# Patient Record
Sex: Female | Born: 1970 | Hispanic: Yes | Smoking: Never smoker
Health system: Southern US, Community
[De-identification: ages and names within clinical notes are randomized; demographics above are authoritative.]

## PROBLEM LIST (undated history)

## (undated) DIAGNOSIS — E7800 Pure hypercholesterolemia, unspecified: Secondary | ICD-10-CM

## (undated) DIAGNOSIS — I10 Essential (primary) hypertension: Secondary | ICD-10-CM

## (undated) DIAGNOSIS — C801 Malignant (primary) neoplasm, unspecified: Secondary | ICD-10-CM

## (undated) DIAGNOSIS — F32A Depression, unspecified: Secondary | ICD-10-CM

## (undated) DIAGNOSIS — F419 Anxiety disorder, unspecified: Secondary | ICD-10-CM

## (undated) HISTORY — PX: MASTECTOMY: SHX3

## (undated) HISTORY — PX: ABDOMINAL HYSTERECTOMY: SHX81

---

## 2020-06-10 DIAGNOSIS — R42 Dizziness and giddiness: Secondary | ICD-10-CM | POA: Insufficient documentation

## 2020-12-05 ENCOUNTER — Encounter (HOSPITAL_COMMUNITY): Payer: Self-pay | Admitting: Emergency Medicine

## 2020-12-05 ENCOUNTER — Emergency Department (HOSPITAL_COMMUNITY)
Admission: EM | Admit: 2020-12-05 | Discharge: 2020-12-05 | Disposition: A | Discharge status: Home / Self Care | Payer: Medicare Other | Attending: Emergency Medicine | Admitting: Emergency Medicine

## 2020-12-05 ENCOUNTER — Other Ambulatory Visit: Payer: Self-pay

## 2020-12-05 DIAGNOSIS — R0602 Shortness of breath: Secondary | ICD-10-CM | POA: Insufficient documentation

## 2020-12-05 DIAGNOSIS — Z5321 Procedure and treatment not carried out due to patient leaving prior to being seen by health care provider: Secondary | ICD-10-CM | POA: Diagnosis not present

## 2020-12-05 DIAGNOSIS — R112 Nausea with vomiting, unspecified: Secondary | ICD-10-CM | POA: Insufficient documentation

## 2020-12-05 DIAGNOSIS — R519 Headache, unspecified: Secondary | ICD-10-CM | POA: Insufficient documentation

## 2020-12-05 HISTORY — DX: Anxiety disorder, unspecified: F41.9

## 2020-12-05 HISTORY — DX: Pure hypercholesterolemia, unspecified: E78.00

## 2020-12-05 HISTORY — DX: Malignant (primary) neoplasm, unspecified: C80.1

## 2020-12-05 HISTORY — DX: Depression, unspecified: F32.A

## 2020-12-05 HISTORY — DX: Essential (primary) hypertension: I10

## 2020-12-05 LAB — CBC
HCT: 46.1 % — ABNORMAL HIGH (ref 36.0–46.0)
Hemoglobin: 15.4 g/dL — ABNORMAL HIGH (ref 12.0–15.0)
MCH: 32.4 pg (ref 26.0–34.0)
MCHC: 33.4 g/dL (ref 30.0–36.0)
MCV: 96.8 fL (ref 80.0–100.0)
Platelets: 152 10*3/uL (ref 150–400)
RBC: 4.76 MIL/uL (ref 3.87–5.11)
RDW: 13.1 % (ref 11.5–15.5)
WBC: 4.8 10*3/uL (ref 4.0–10.5)
nRBC: 0 % (ref 0.0–0.2)

## 2020-12-05 LAB — COMPREHENSIVE METABOLIC PANEL
ALT: 80 U/L — ABNORMAL HIGH (ref 0–44)
AST: 58 U/L — ABNORMAL HIGH (ref 15–41)
Albumin: 4.8 g/dL (ref 3.5–5.0)
Alkaline Phosphatase: 129 U/L — ABNORMAL HIGH (ref 38–126)
Anion gap: 8 (ref 5–15)
BUN: 14 mg/dL (ref 6–20)
CO2: 27 mmol/L (ref 22–32)
Calcium: 9.8 mg/dL (ref 8.9–10.3)
Chloride: 106 mmol/L (ref 98–111)
Creatinine, Ser: 0.74 mg/dL (ref 0.44–1.00)
GFR, Estimated: >60 mL/min (ref >60)
Glucose, Bld: 144 mg/dL — ABNORMAL HIGH (ref 70–99)
Potassium: 4.2 mmol/L (ref 3.5–5.1)
Sodium: 141 mmol/L (ref 135–145)
Total Bilirubin: 0.6 mg/dL (ref 0.3–1.2)
Total Protein: 7.9 g/dL (ref 6.5–8.1)

## 2020-12-05 LAB — LIPASE, BLOOD: Lipase: 37 U/L (ref 11–51)

## 2020-12-05 NOTE — ED Triage Notes (Addendum)
Patient woke this morning with a headache, nausea, emesis. Was driving down the road today and her boyfriend had to pull over because she had an episode of emesis. Denies diarrhea or abdominal pain. Got both COVID-19 vaccines. Has a bad headache, took her migraine medicine and came to ED as referred by her PCP.

## 2021-01-31 DIAGNOSIS — K760 Fatty (change of) liver, not elsewhere classified: Secondary | ICD-10-CM | POA: Insufficient documentation

## 2021-06-21 ENCOUNTER — Emergency Department (HOSPITAL_COMMUNITY): Payer: Medicare Other

## 2021-06-21 ENCOUNTER — Encounter (HOSPITAL_COMMUNITY): Payer: Self-pay | Admitting: Internal Medicine

## 2021-06-21 ENCOUNTER — Inpatient Hospital Stay (HOSPITAL_COMMUNITY)
Admission: EM | Admit: 2021-06-21 | Discharge: 2021-06-27 | DRG: 314 | Disposition: A | Discharge status: Home Health | Payer: Medicare Other | Attending: Family Medicine | Admitting: Family Medicine

## 2021-06-21 ENCOUNTER — Other Ambulatory Visit: Payer: Self-pay

## 2021-06-21 DIAGNOSIS — L03114 Cellulitis of left upper limb: Principal | ICD-10-CM | POA: Diagnosis present

## 2021-06-21 DIAGNOSIS — R7881 Bacteremia: Secondary | ICD-10-CM | POA: Diagnosis present

## 2021-06-21 DIAGNOSIS — Z853 Personal history of malignant neoplasm of breast: Secondary | ICD-10-CM

## 2021-06-21 DIAGNOSIS — R509 Fever, unspecified: Secondary | ICD-10-CM

## 2021-06-21 DIAGNOSIS — L299 Pruritus, unspecified: Secondary | ICD-10-CM | POA: Diagnosis not present

## 2021-06-21 DIAGNOSIS — F32A Depression, unspecified: Secondary | ICD-10-CM | POA: Diagnosis present

## 2021-06-21 DIAGNOSIS — R519 Headache, unspecified: Secondary | ICD-10-CM | POA: Diagnosis present

## 2021-06-21 DIAGNOSIS — Z452 Encounter for adjustment and management of vascular access device: Secondary | ICD-10-CM

## 2021-06-21 DIAGNOSIS — Y848 Other medical procedures as the cause of abnormal reaction of the patient, or of later complication, without mention of misadventure at the time of the procedure: Secondary | ICD-10-CM | POA: Diagnosis present

## 2021-06-21 DIAGNOSIS — Z20822 Contact with and (suspected) exposure to covid-19: Secondary | ICD-10-CM | POA: Diagnosis present

## 2021-06-21 DIAGNOSIS — Z79811 Long term (current) use of aromatase inhibitors: Secondary | ICD-10-CM

## 2021-06-21 DIAGNOSIS — Z79899 Other long term (current) drug therapy: Secondary | ICD-10-CM

## 2021-06-21 DIAGNOSIS — Z9889 Other specified postprocedural states: Secondary | ICD-10-CM

## 2021-06-21 DIAGNOSIS — E7800 Pure hypercholesterolemia, unspecified: Secondary | ICD-10-CM | POA: Diagnosis present

## 2021-06-21 DIAGNOSIS — A408 Other streptococcal sepsis: Secondary | ICD-10-CM | POA: Diagnosis present

## 2021-06-21 DIAGNOSIS — I10 Essential (primary) hypertension: Secondary | ICD-10-CM | POA: Diagnosis present

## 2021-06-21 DIAGNOSIS — D849 Immunodeficiency, unspecified: Secondary | ICD-10-CM | POA: Diagnosis present

## 2021-06-21 DIAGNOSIS — Z95828 Presence of other vascular implants and grafts: Secondary | ICD-10-CM

## 2021-06-21 DIAGNOSIS — D696 Thrombocytopenia, unspecified: Secondary | ICD-10-CM | POA: Diagnosis present

## 2021-06-21 DIAGNOSIS — Z888 Allergy status to other drugs, medicaments and biological substances status: Secondary | ICD-10-CM | POA: Diagnosis not present

## 2021-06-21 DIAGNOSIS — L03313 Cellulitis of chest wall: Secondary | ICD-10-CM | POA: Diagnosis present

## 2021-06-21 DIAGNOSIS — T80211A Bloodstream infection due to central venous catheter, initial encounter: Principal | ICD-10-CM | POA: Diagnosis present

## 2021-06-21 DIAGNOSIS — L0390 Cellulitis, unspecified: Secondary | ICD-10-CM | POA: Diagnosis present

## 2021-06-21 DIAGNOSIS — Z9013 Acquired absence of bilateral breasts and nipples: Secondary | ICD-10-CM | POA: Diagnosis not present

## 2021-06-21 DIAGNOSIS — T80219D Unspecified infection due to central venous catheter, subsequent encounter: Secondary | ICD-10-CM | POA: Diagnosis not present

## 2021-06-21 DIAGNOSIS — G43909 Migraine, unspecified, not intractable, without status migrainosus: Secondary | ICD-10-CM | POA: Diagnosis present

## 2021-06-21 DIAGNOSIS — T80219A Unspecified infection due to central venous catheter, initial encounter: Secondary | ICD-10-CM | POA: Diagnosis present

## 2021-06-21 LAB — CBC
HCT: 42.8 % (ref 36.0–46.0)
Hemoglobin: 14.3 g/dL (ref 12.0–15.0)
MCH: 31.6 pg (ref 26.0–34.0)
MCHC: 33.4 g/dL (ref 30.0–36.0)
MCV: 94.7 fL (ref 80.0–100.0)
Platelets: 112 10*3/uL — ABNORMAL LOW (ref 150–400)
RBC: 4.52 MIL/uL (ref 3.87–5.11)
RDW: 13.8 % (ref 11.5–15.5)
WBC: 5.8 10*3/uL (ref 4.0–10.5)
nRBC: 0 % (ref 0.0–0.2)

## 2021-06-21 LAB — BASIC METABOLIC PANEL
Anion gap: 7 (ref 5–15)
BUN: 13 mg/dL (ref 6–20)
CO2: 22 mmol/L (ref 22–32)
Calcium: 8.7 mg/dL — ABNORMAL LOW (ref 8.9–10.3)
Chloride: 106 mmol/L (ref 98–111)
Creatinine, Ser: 0.87 mg/dL (ref 0.44–1.00)
GFR, Estimated: >60 mL/min (ref >60)
Glucose, Bld: 139 mg/dL — ABNORMAL HIGH (ref 70–99)
Potassium: 3.6 mmol/L (ref 3.5–5.1)
Sodium: 135 mmol/L (ref 135–145)

## 2021-06-21 LAB — RESP PANEL BY RT-PCR (FLU A&B, COVID) ARPGX2
Influenza A by PCR: NEGATIVE
Influenza B by PCR: NEGATIVE
SARS Coronavirus 2 by RT PCR: NEGATIVE

## 2021-06-21 LAB — LACTIC ACID, PLASMA: Lactic Acid, Venous: 0.9 mmol/L (ref 0.5–1.9)

## 2021-06-21 LAB — HIV ANTIBODY (ROUTINE TESTING W REFLEX): HIV Screen 4th Generation wRfx: NONREACTIVE

## 2021-06-21 IMAGING — CT CT HEAD W/O CM
3 series · 16 of 47 positions shown, 19 images · non-contrast
Comparison: None.

CLINICAL DATA: Headache.

EXAM:
CT HEAD WITHOUT CONTRAST
TECHNIQUE: Contiguous axial images were obtained from the base of the skull
through the vertex without intravenous contrast.

[Series 2: head w o · axial · 0.44mm/px · z∈[+55,+185]mm · 10 of 32 slices shown, 13 images]
[im 3/32  brain]
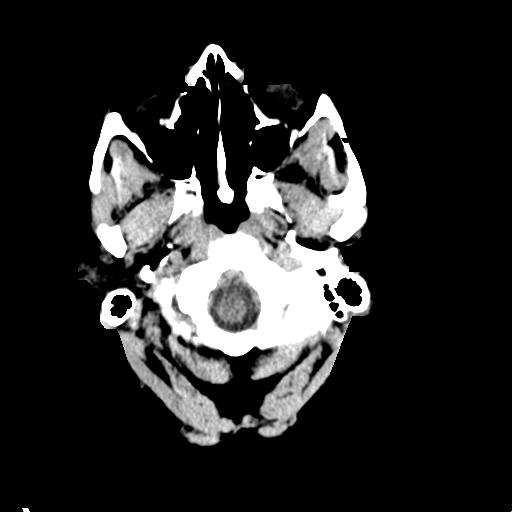
[im 3/32  bone]
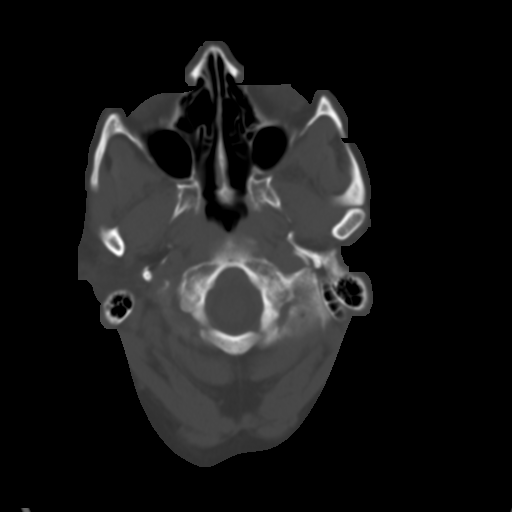
[im 6/32  brain]
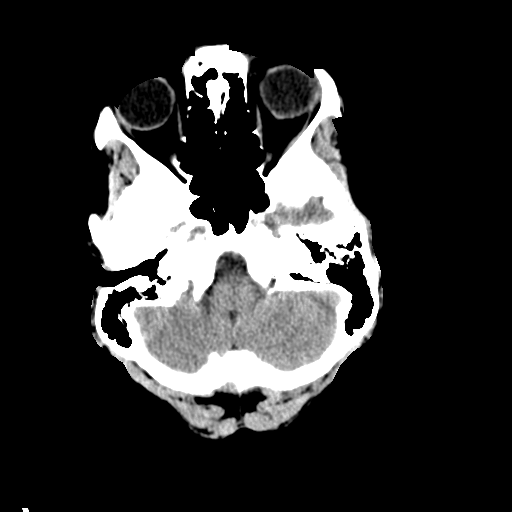
[im 9/32  brain]
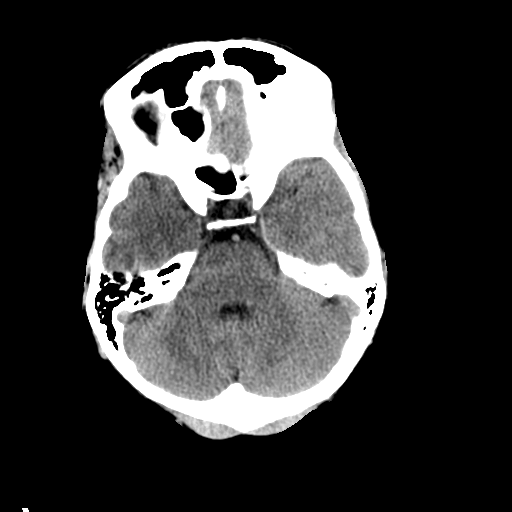
[im 11/32  brain]
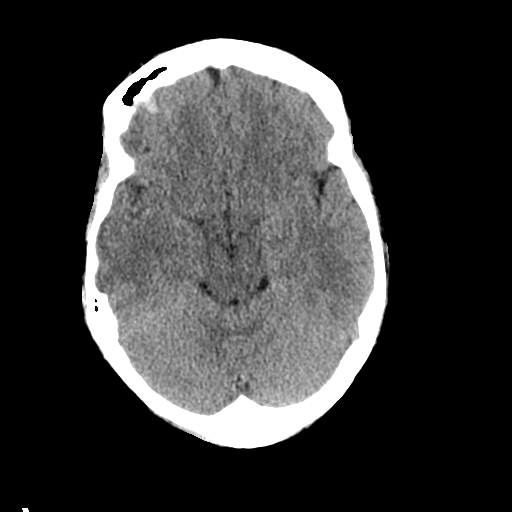
[im 14/32  brain]
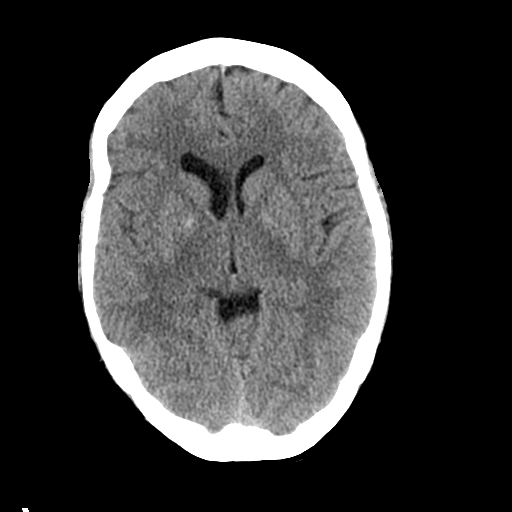
[im 14/32  bone]
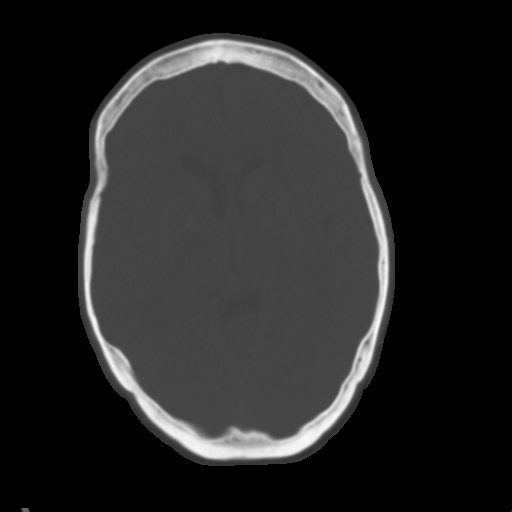
[im 18/32  brain]
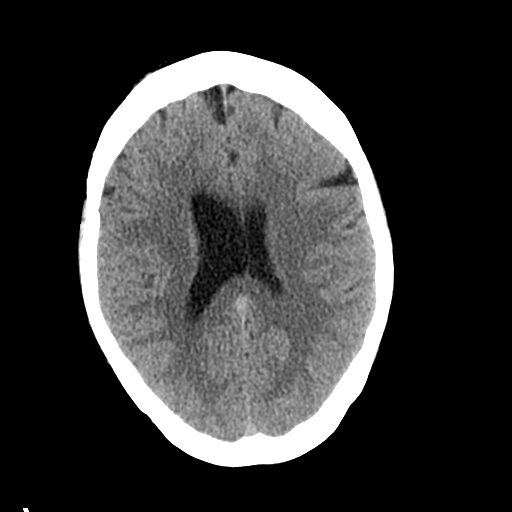
[im 21/32  brain]
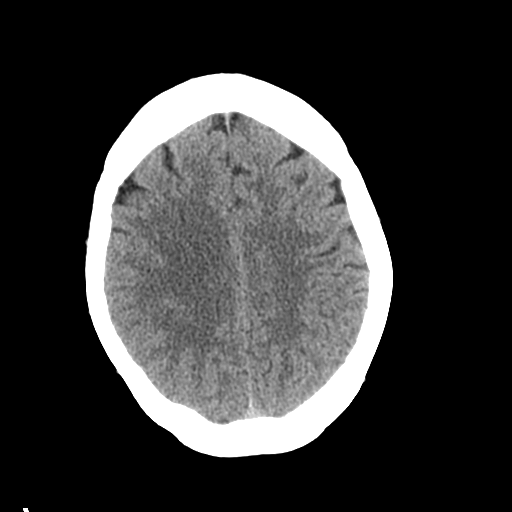
[im 24/32  brain]
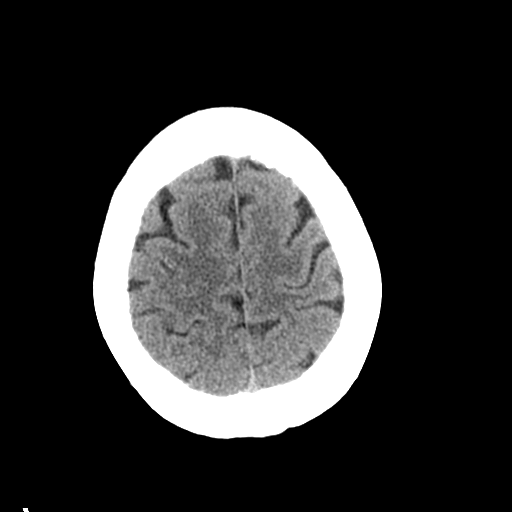
[im 26/32  brain]
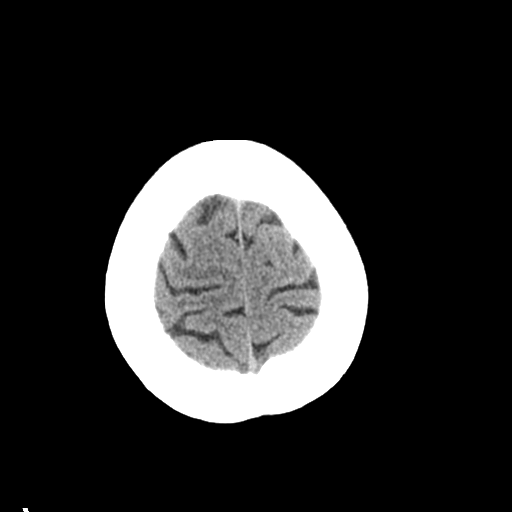
[im 26/32  bone]
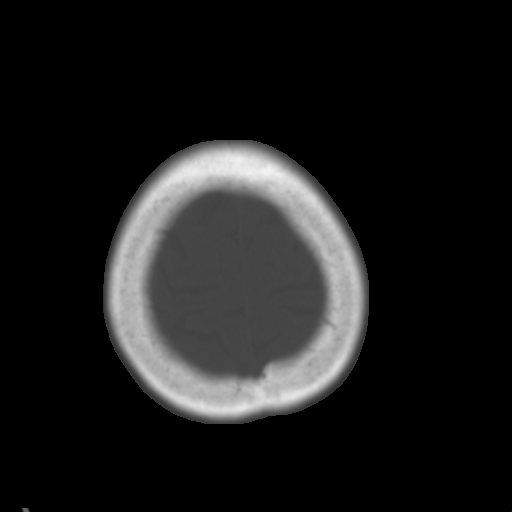
[im 29/32  brain]
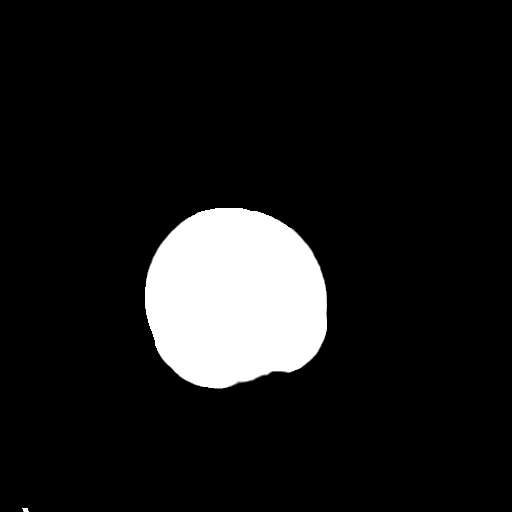

[Series 4: coronal soft · coronal · 0.32mm/px · 3 of 69 slices shown]
[im 23/69  brain]
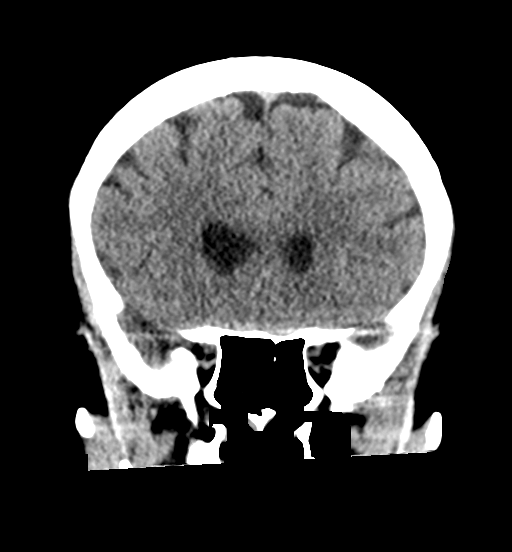
[im 31/69  brain]
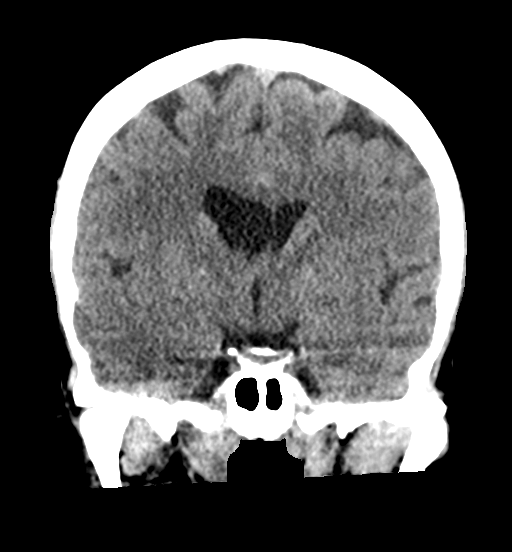
[im 38/69  brain]
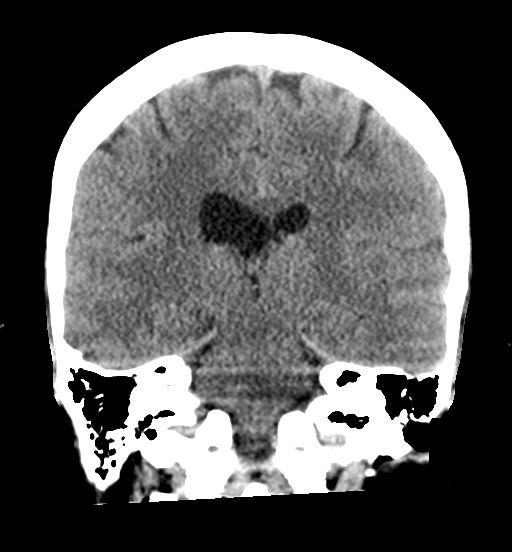

[Series 5: sagittal soft · sagittal · 0.36mm/px · 3 of 55 slices shown]
[im 19/55  brain]
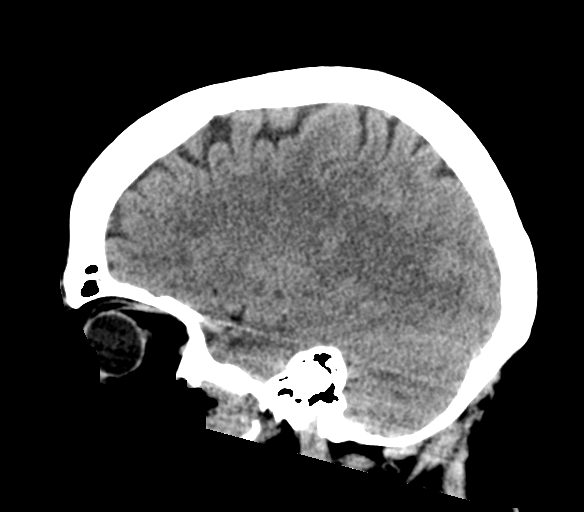
[im 28/55  brain]
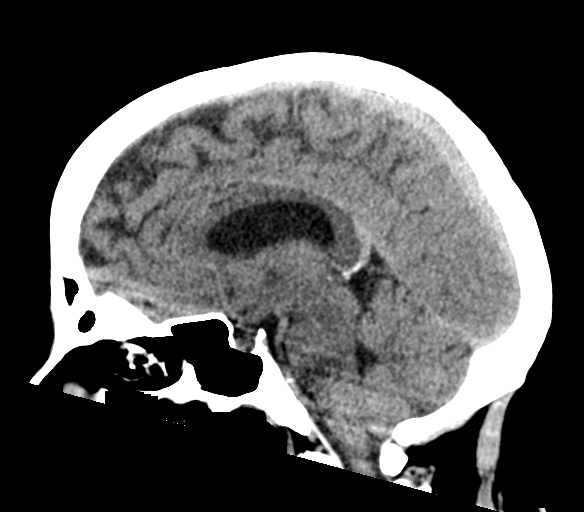
[im 37/55  brain]
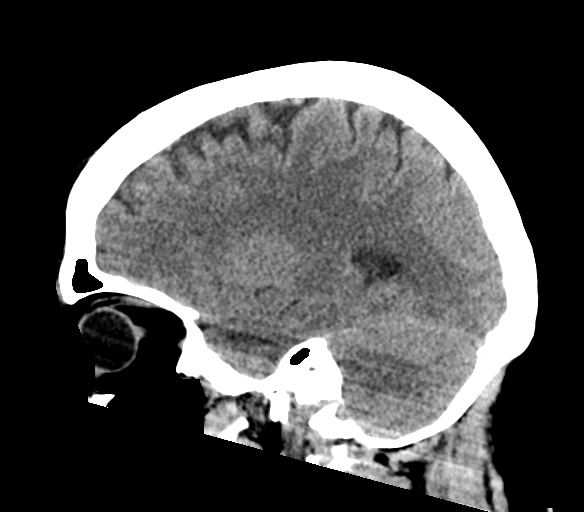

[16 of 47 positions shown; findings below may reference images not displayed]

FINDINGS: Brain: There is no evidence for acute hemorrhage, hydrocephalus,
mass lesion, or abnormal extra-axial fluid collection. No definite
CT evidence for acute infarction.

Vascular: No hyperdense vessel or unexpected calcification.

Skull: No evidence for fracture. No worrisome lytic or sclerotic
lesion.

Sinuses/Orbits: The visualized paranasal sinuses and mastoid air
cells are clear. Visualized portions of the globes and intraorbital
fat are unremarkable.

Other: None.
IMPRESSION: Unremarkable study.  No acute intracranial abnormality.

## 2021-06-21 IMAGING — US US EXTREM  UP VENOUS*L*
1 series · 13 of 24 positions shown · non-contrast
Comparison: None.

CLINICAL DATA: Left upper extremity swelling for 3 days



[Series 1: us extrem up venous*left* · 0.08mm/px · 13 of 40 slices shown]
[im 1/40]
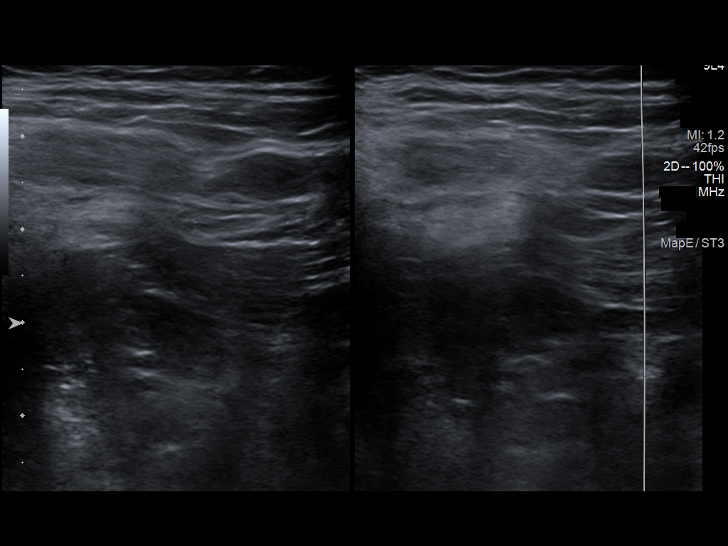
[im 4/40]
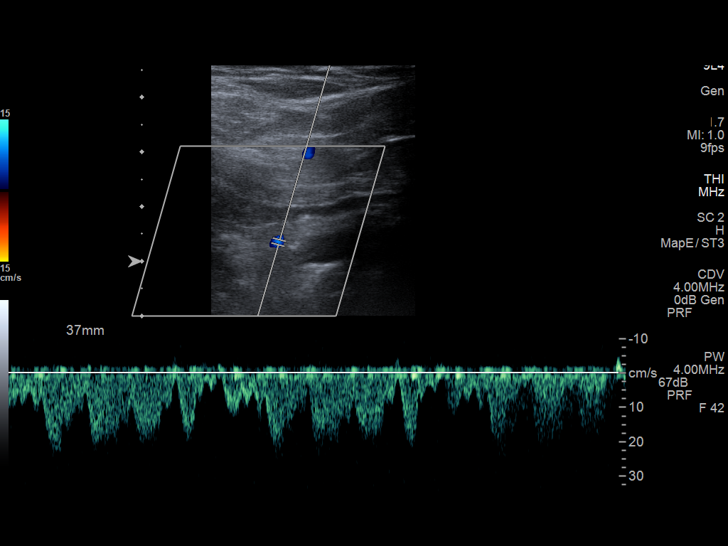
[im 7/40]
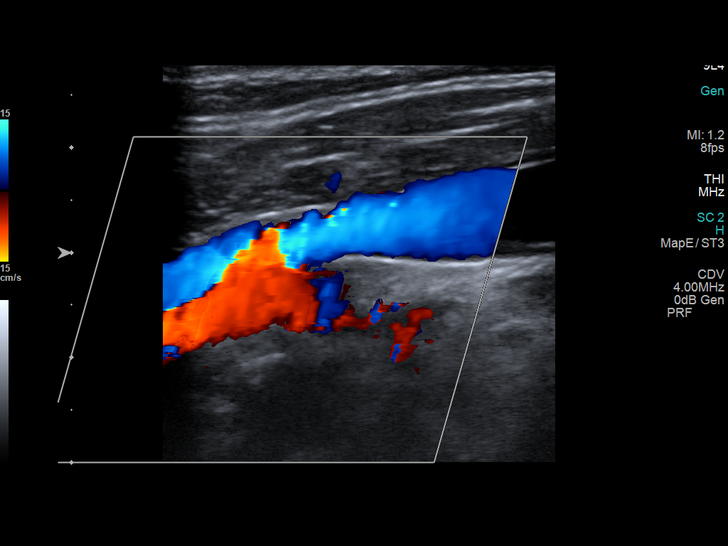
[im 11/40]
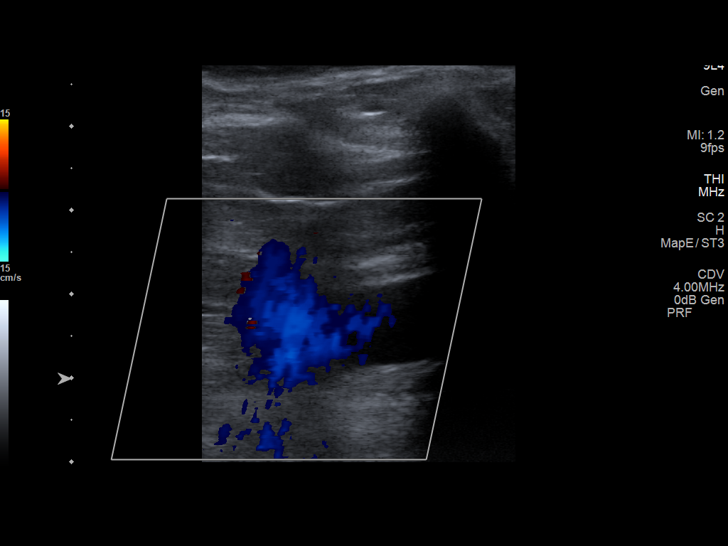
[im 14/40]
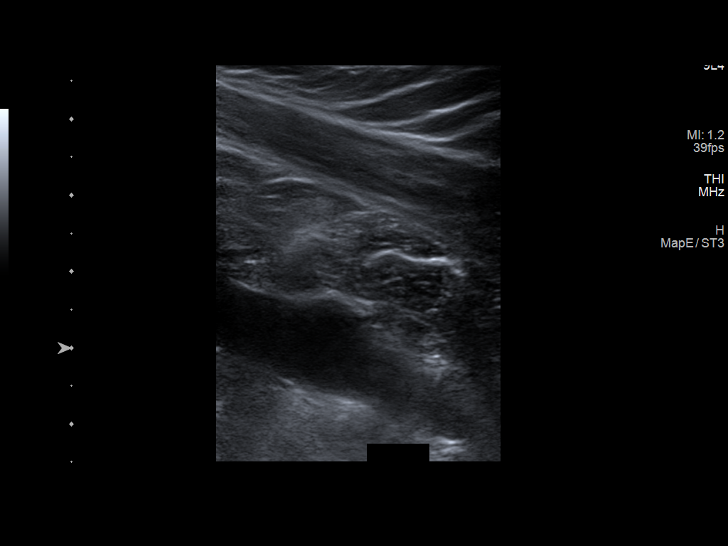
[im 17/40]
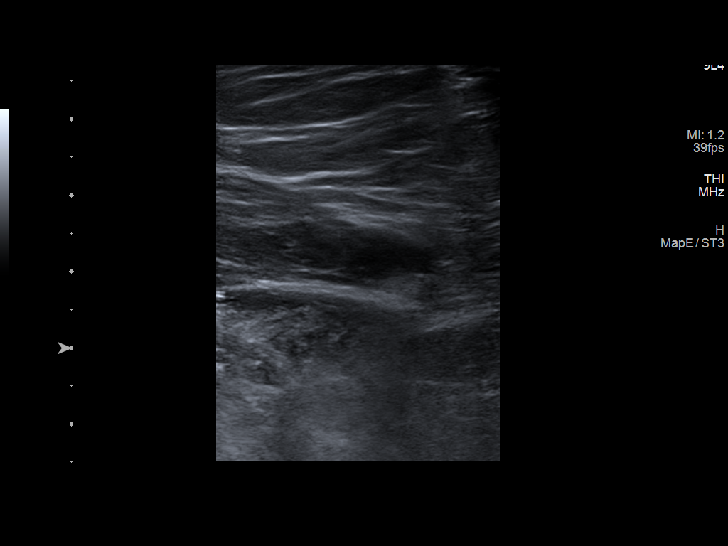
[im 21/40]
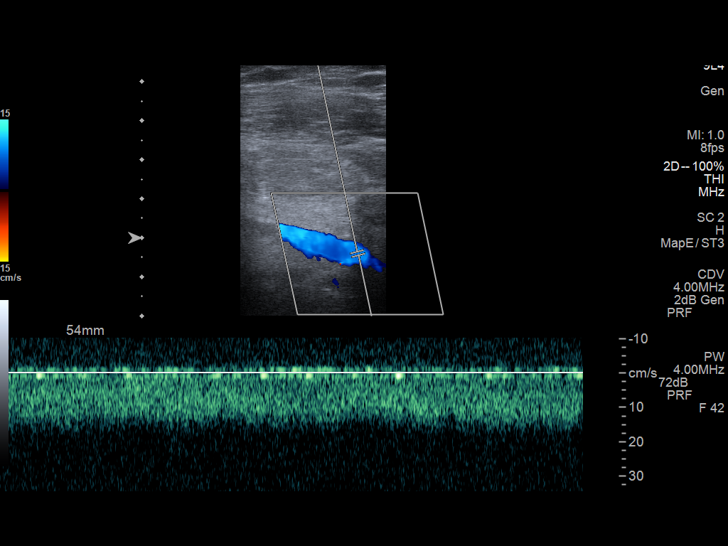
[im 23/40]
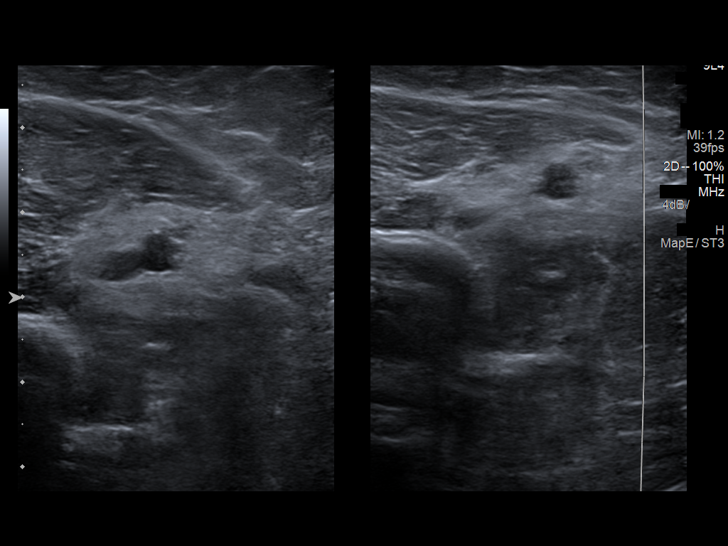
[im 26/40]
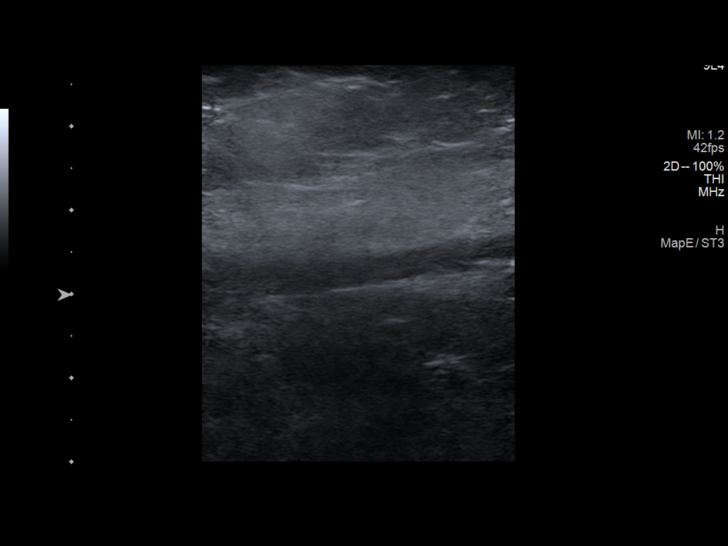
[im 29/40]
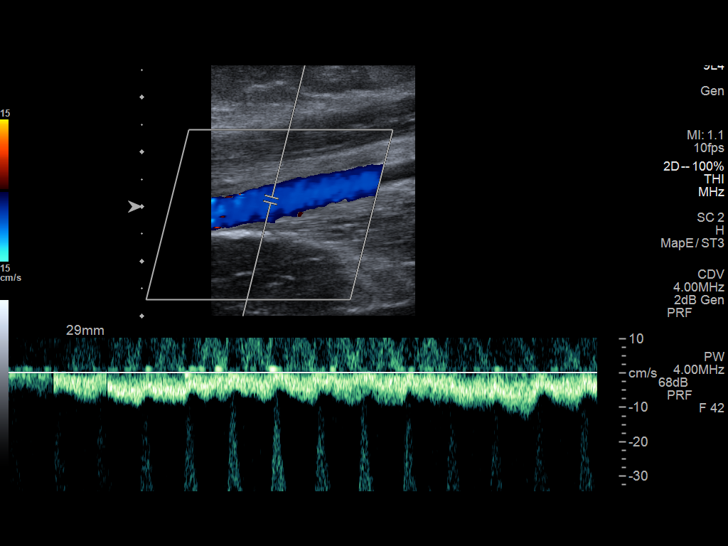
[im 33/40]
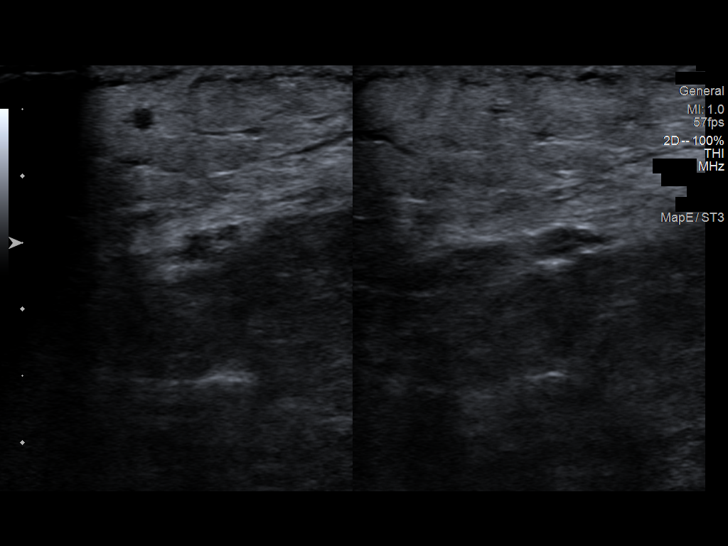
[im 36/40]
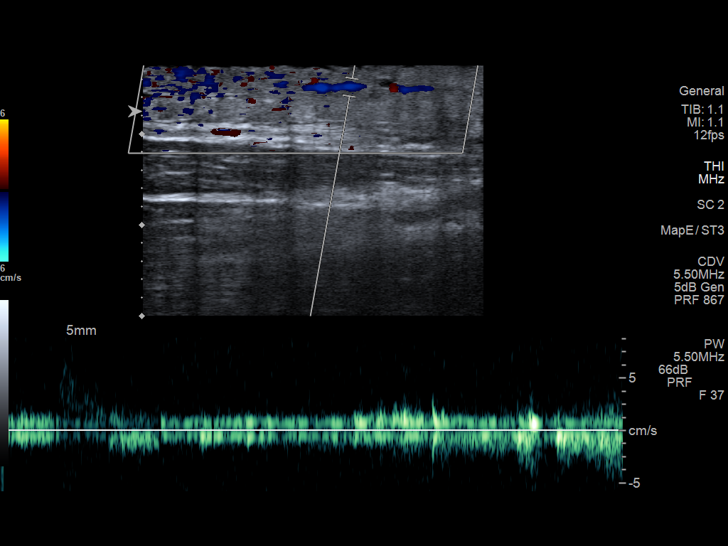
[im 40/40]
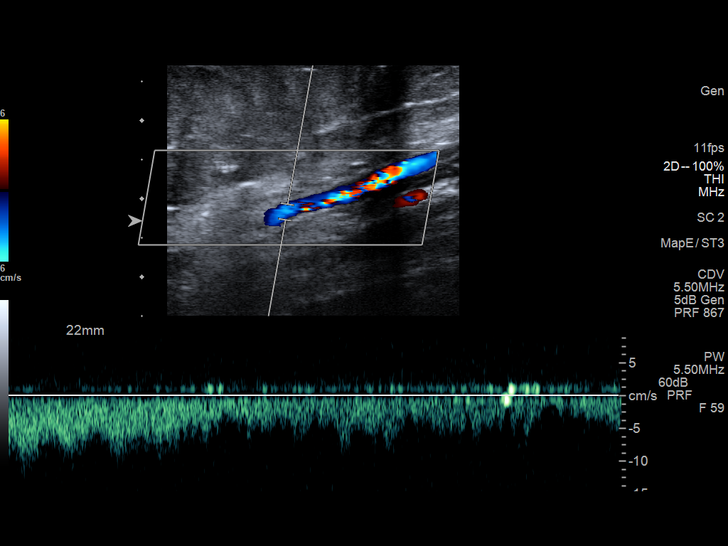

[13 of 24 positions shown; findings below may reference images not displayed]

FINDINGS: Contralateral Subclavian Vein: Respiratory phasicity is normal and
symmetric with the symptomatic side. No evidence of thrombus. Normal
compressibility.

Internal Jugular Vein: No evidence of thrombus. Normal
compressibility, respiratory phasicity and response to augmentation.

Subclavian Vein: No evidence of thrombus. Normal compressibility,
respiratory phasicity and response to augmentation.

Axillary Vein: No evidence of thrombus. Normal compressibility,
respiratory phasicity and response to augmentation.

Cephalic Vein: No evidence of thrombus. Normal compressibility,
respiratory phasicity and response to augmentation.

Basilic Vein: No evidence of thrombus. Normal compressibility,
respiratory phasicity and response to augmentation.

Brachial Veins: No evidence of thrombus. Normal compressibility,
respiratory phasicity and response to augmentation.

Radial Veins: No evidence of thrombus. Normal compressibility,
respiratory phasicity and response to augmentation.

Ulnar Veins: No evidence of thrombus. Normal compressibility,
respiratory phasicity and response to augmentation.
IMPRESSION: No evidence of DVT within the left upper extremity.

## 2021-06-21 MED ORDER — ACETAMINOPHEN 325 MG PO TABS
650.0000 mg | ORAL_TABLET | Freq: Four times a day (QID) | ORAL | Status: DC | PRN
  Administered 2021-06-21 – 2021-06-26 (×9): 650 mg via ORAL
  Filled 2021-06-21 (×8): qty 2

## 2021-06-21 MED ORDER — SODIUM CHLORIDE 0.9 % IV SOLN
1000.0000 mL | INTRAVENOUS | Status: DC
  Administered 2021-06-21: 1000 mL via INTRAVENOUS

## 2021-06-21 MED ORDER — MECLIZINE HCL 12.5 MG PO TABS: 12.5000 mg | ORAL_TABLET | Freq: Every day | ORAL | Status: DC | PRN

## 2021-06-21 MED ORDER — SODIUM CHLORIDE 0.9 % IV BOLUS (SEPSIS)
1000.0000 mL | Freq: Once | INTRAVENOUS | Status: AC
  Administered 2021-06-21: 1000 mL via INTRAVENOUS

## 2021-06-21 MED ORDER — ONDANSETRON HCL 4 MG/2ML IJ SOLN: 4.0000 mg | Freq: Four times a day (QID) | INTRAMUSCULAR | Status: DC | PRN

## 2021-06-21 MED ORDER — CEFAZOLIN SODIUM-DEXTROSE 1-4 GM/50ML-% IV SOLN
1.0000 g | Freq: Three times a day (TID) | INTRAVENOUS | Status: DC
  Filled 2021-06-21 (×4): qty 50

## 2021-06-21 MED ORDER — CEFAZOLIN SODIUM-DEXTROSE 2-4 GM/100ML-% IV SOLN
2.0000 g | Freq: Three times a day (TID) | INTRAVENOUS | Status: AC
  Administered 2021-06-21 – 2021-06-23 (×8): 2 g via INTRAVENOUS
  Filled 2021-06-21 (×8): qty 100

## 2021-06-21 MED ORDER — SODIUM CHLORIDE 0.9 % IV SOLN
3.0000 g | Freq: Four times a day (QID) | INTRAVENOUS | Status: DC
  Administered 2021-06-21: 3 g via INTRAVENOUS
  Filled 2021-06-21: qty 8

## 2021-06-21 MED ORDER — LISINOPRIL 10 MG PO TABS: 10.0000 mg | ORAL_TABLET | Freq: Two times a day (BID) | ORAL | Status: DC

## 2021-06-21 MED ORDER — SERTRALINE HCL 50 MG PO TABS
50.0000 mg | ORAL_TABLET | Freq: Every day | ORAL | Status: DC
  Administered 2021-06-21 – 2021-06-27 (×7): 50 mg via ORAL
  Filled 2021-06-21 (×7): qty 1

## 2021-06-21 MED ORDER — TOPIRAMATE 25 MG PO TABS
25.0000 mg | ORAL_TABLET | Freq: Two times a day (BID) | ORAL | Status: DC
  Administered 2021-06-21 – 2021-06-27 (×13): 25 mg via ORAL
  Filled 2021-06-21 (×13): qty 1

## 2021-06-21 MED ORDER — ONDANSETRON HCL 4 MG PO TABS: 4.0000 mg | ORAL_TABLET | Freq: Four times a day (QID) | ORAL | Status: DC | PRN

## 2021-06-21 MED ORDER — QUETIAPINE FUMARATE 25 MG PO TABS: 50.0000 mg | ORAL_TABLET | Freq: Every day | ORAL | Status: DC | PRN

## 2021-06-21 MED ORDER — ACETAMINOPHEN 650 MG RE SUPP: 650.0000 mg | Freq: Four times a day (QID) | RECTAL | Status: DC | PRN

## 2021-06-21 MED ORDER — GABAPENTIN 300 MG PO CAPS
300.0000 mg | ORAL_CAPSULE | Freq: Two times a day (BID) | ORAL | Status: DC
  Administered 2021-06-21 – 2021-06-27 (×13): 300 mg via ORAL
  Filled 2021-06-21 (×13): qty 1

## 2021-06-21 MED ORDER — PROCHLORPERAZINE EDISYLATE 10 MG/2ML IJ SOLN
10.0000 mg | Freq: Once | INTRAMUSCULAR | Status: AC
  Administered 2021-06-21: 10 mg via INTRAVENOUS
  Filled 2021-06-21: qty 2

## 2021-06-21 MED ORDER — EXEMESTANE 25 MG PO TABS
25.0000 mg | ORAL_TABLET | Freq: Every day | ORAL | Status: DC
  Administered 2021-06-21 – 2021-06-26 (×6): 25 mg via ORAL
  Filled 2021-06-21 (×8): qty 1

## 2021-06-21 MED ORDER — LORATADINE 10 MG PO TABS
10.0000 mg | ORAL_TABLET | Freq: Every day | ORAL | Status: DC
  Administered 2021-06-21 – 2021-06-27 (×7): 10 mg via ORAL
  Filled 2021-06-21 (×7): qty 1

## 2021-06-21 MED ORDER — ROSUVASTATIN CALCIUM 10 MG PO TABS
5.0000 mg | ORAL_TABLET | Freq: Every day | ORAL | Status: DC
  Administered 2021-06-21 – 2021-06-26 (×6): 5 mg via ORAL
  Filled 2021-06-21 (×6): qty 1

## 2021-06-21 MED ORDER — SUMATRIPTAN SUCCINATE 25 MG PO TABS: 25.0000 mg | ORAL_TABLET | ORAL | Status: DC | PRN

## 2021-06-21 MED ORDER — METOPROLOL SUCCINATE ER 25 MG PO TB24
25.0000 mg | ORAL_TABLET | Freq: Every day | ORAL | Status: DC
  Administered 2021-06-21 – 2021-06-27 (×7): 25 mg via ORAL
  Filled 2021-06-21 (×7): qty 1

## 2021-06-21 MED ORDER — VENLAFAXINE HCL ER 75 MG PO CP24
150.0000 mg | ORAL_CAPSULE | Freq: Every day | ORAL | Status: DC
  Administered 2021-06-21 – 2021-06-27 (×7): 150 mg via ORAL
  Filled 2021-06-21 (×7): qty 2

## 2021-06-21 MED ORDER — SODIUM CHLORIDE 0.9 % IV BOLUS
1000.0000 mL | Freq: Once | INTRAVENOUS | Status: AC
  Administered 2021-06-21: 1000 mL via INTRAVENOUS

## 2021-06-21 MED ORDER — BUTALBITAL-APAP-CAFFEINE 50-325-40 MG PO TABS
1.0000 | ORAL_TABLET | ORAL | Status: DC | PRN
  Administered 2021-06-21: 1 via ORAL
  Filled 2021-06-21: qty 1

## 2021-06-21 MED ORDER — KETOROLAC TROMETHAMINE 30 MG/ML IJ SOLN
30.0000 mg | Freq: Four times a day (QID) | INTRAMUSCULAR | Status: AC | PRN
  Administered 2021-06-22 – 2021-06-26 (×4): 30 mg via INTRAVENOUS
  Filled 2021-06-21 (×5): qty 1

## 2021-06-21 MED ORDER — SODIUM CHLORIDE 0.9 % IV SOLN: INTRAVENOUS | Status: AC

## 2021-06-21 MED ORDER — DIPHENHYDRAMINE HCL 50 MG/ML IJ SOLN
25.0000 mg | Freq: Once | INTRAMUSCULAR | Status: AC
  Administered 2021-06-21: 25 mg via INTRAVENOUS
  Filled 2021-06-21: qty 1

## 2021-06-21 NOTE — ED Provider Notes (Signed)
[facility] EMERGENCY DEPARTMENT Provider Note   CSN: [id number] Arrival date & time: 06/21/21  0700     History Chief Complaint  Patient presents with   Headache    Sharon Phillips is a 51 y.o. female.   Headache  Patient has a history of breast cancer.  Patient states she is currently in remission.  She has a history of migraine headaches in the last couple of days has had a severe headache.  Patient states this is worse than usual.  She has not had any nausea vomiting.  No fever.  No neck stiffness.  Patient has tried taking her medications without relief.  Patient also noticed redness in her left arm.  It is painful and hurts for her to move.  This started within the last couple of days.  No known injuries to her arm.  Past Medical History:  Diagnosis Date   Anxiety    Cancer ([place])    Depression    High cholesterol    Hypertension     There are no problems to display for this patient.   Past Surgical History:  Procedure Laterality Date   ABDOMINAL HYSTERECTOMY     MASTECTOMY     bilateral     OB History   No obstetric history on file.     No family history on file.     Home Medications Prior to Admission medications   Not on File    Allergies    Hibiclens [chlorhexidine gluconate]  Review of Systems   Review of Systems  Neurological:  Positive for headaches.  All other systems reviewed and are negative.  Physical Exam Updated Vital Signs BP 138/67 (BP Location: Right Arm)   Pulse (!) 117   Temp 99.9 F (37.7 C) (Oral)   Resp 18   Ht 1.626 m (5\' 4" )   Wt 77.1 kg   SpO2 98%   BMI 29.18 kg/m   Physical Exam Vitals and nursing note reviewed.  Constitutional:      Appearance: She is well-developed. She is ill-appearing.  HENT:     Head: Normocephalic and atraumatic.     Right Ear: External ear normal.     Left Ear: External ear normal.  Eyes:     General: No scleral icterus.       Right eye: No discharge.        Left eye: No  discharge.     Conjunctiva/sclera: Conjunctivae normal.  Neck:     Trachea: No tracheal deviation.  Cardiovascular:     Rate and Rhythm: Regular rhythm. Tachycardia present.  Pulmonary:     Effort: Pulmonary effort is normal. No respiratory distress.     Breath sounds: Normal breath sounds. No stridor. No wheezing or rales.  Abdominal:     General: Bowel sounds are normal. There is no distension.     Palpations: Abdomen is soft.     Tenderness: There is no abdominal tenderness. There is no guarding or rebound.  Musculoskeletal:        General: Swelling and tenderness present. No deformity.     Cervical back: Neck supple.     Comments: Erythema to the left forearm extending up towards the upper arm, tenderness to palpation; old bruise noted on right lateral thigh  Skin:    General: Skin is warm and dry.     Findings: No rash.  Neurological:     General: No focal deficit present.     Mental Status: She is alert.  Cranial Nerves: No cranial nerve deficit (no facial droop, extraocular movements intact, no slurred speech).     Sensory: No sensory deficit.     Motor: No abnormal muscle tone or seizure activity.     Coordination: Coordination normal.  Psychiatric:        Mood and Affect: Mood normal.    ED Results / Procedures / Treatments   Labs (all labs ordered are listed, but only abnormal results are displayed) Labs Reviewed  CBC - Abnormal; Notable for the following components:      Result Value   Platelets 112 (*)    All other components within normal limits  BASIC METABOLIC PANEL - Abnormal; Notable for the following components:   Glucose, Bld 139 (*)    Calcium 8.7 (*)    All other components within normal limits  CULTURE, BLOOD (ROUTINE X 2)  CULTURE, BLOOD (ROUTINE X 2)  RESP PANEL BY RT-PCR (FLU A&B, COVID) ARPGX2  LACTIC ACID, PLASMA    EKG None  Radiology CT Head Wo Contrast  Result Date: 06/21/2021 CLINICAL DATA:  Headache. EXAM: CT HEAD WITHOUT  CONTRAST TECHNIQUE: Contiguous axial images were obtained from the base of the skull through the vertex without intravenous contrast. COMPARISON:  None. FINDINGS: Brain: There is no evidence for acute hemorrhage, hydrocephalus, mass lesion, or abnormal extra-axial fluid collection. No definite CT evidence for acute infarction. Vascular: No hyperdense vessel or unexpected calcification. Skull: No evidence for fracture. No worrisome lytic or sclerotic lesion. Sinuses/Orbits: The visualized paranasal sinuses and mastoid air cells are clear. Visualized portions of the globes and intraorbital fat are unremarkable. Other: None. IMPRESSION: Unremarkable study.  No acute intracranial abnormality. Electronically Signed   By: Misty Stanley M.D.   On: 06/21/2021 08:24   US Venous Img Upper Uni Left  Result Date: 06/21/2021 CLINICAL DATA:  Left upper extremity swelling for 3 days EXAM: LEFT UPPER EXTREMITY VENOUS DOPPLER ULTRASOUND TECHNIQUE: Gray-scale sonography with graded compression, as well as color Doppler and duplex ultrasound were performed to evaluate the upper extremity deep venous system from the level of the subclavian vein and including the jugular, axillary, basilic, radial, ulnar and upper cephalic vein. Spectral Doppler was utilized to evaluate flow at rest and with distal augmentation maneuvers. COMPARISON:  None. FINDINGS: Contralateral Subclavian Vein: Respiratory phasicity is normal and symmetric with the symptomatic side. No evidence of thrombus. Normal compressibility. Internal Jugular Vein: No evidence of thrombus. Normal compressibility, respiratory phasicity and response to augmentation. Subclavian Vein: No evidence of thrombus. Normal compressibility, respiratory phasicity and response to augmentation. Axillary Vein: No evidence of thrombus. Normal compressibility, respiratory phasicity and response to augmentation. Cephalic Vein: No evidence of thrombus. Normal compressibility, respiratory  phasicity and response to augmentation. Basilic Vein: No evidence of thrombus. Normal compressibility, respiratory phasicity and response to augmentation. Brachial Veins: No evidence of thrombus. Normal compressibility, respiratory phasicity and response to augmentation. Radial Veins: No evidence of thrombus. Normal compressibility, respiratory phasicity and response to augmentation. Ulnar Veins: No evidence of thrombus. Normal compressibility, respiratory phasicity and response to augmentation. IMPRESSION: No evidence of DVT within the left upper extremity. Electronically Signed   By: Jerilynn Mages.  Shick M.D.   On: 06/21/2021 10:15    Procedures Procedures   Medications Ordered in ED Medications  sodium chloride 0.9 % bolus 1,000 mL (0 mLs Intravenous Stopped 06/21/21 1002)    Followed by  0.9 %  sodium chloride infusion (1,000 mLs Intravenous New Bag/Given 06/21/21 1002)  Ampicillin-Sulbactam (UNASYN) 3 g in sodium  chloride 0.9 % 100 mL IVPB (has no administration in time range)  sodium chloride 0.9 % bolus 1,000 mL (has no administration in time range)  prochlorperazine (COMPAZINE) injection 10 mg (10 mg Intravenous Given 06/21/21 0833)  diphenhydrAMINE (BENADRYL) injection 25 mg (25 mg Intravenous Given 06/21/21 9470)    ED Course  I have reviewed the triage vital signs and the nursing notes.  Pertinent labs & imaging results that were available during my care of the patient were reviewed by me and considered in my medical decision making (see chart for details).  Clinical Course as of 06/21/21 1103  Tue Jun 21, 2021  0947 Head CT without acute findings [JK]  9628 CBC metabolic panel lactic acid level normal [JK]  1033 Ultrasound without evidence of DVT [JK]    Clinical Course User Index [JK] Dorie Rank, MD   MDM Rules/Calculators/A&P                          Patient presented to the ED for evaluation of headache as well as left arm redness and swelling.  Patient does have history of prior  breast cancer is not undergoing any active treatment.  Patient has not been on any recent medications.  Patient was treated for migraine headache.  Head CT was performed considering her history of malignancy.  No signs of mass or any acute abnormalities noted on head CT.  Patient's arm exam was concerning for the possibility of cellulitis versus DVT.  Doppler study performed no evidence of DVT.  Patient does not have a leukocytosis or lactic acidosis but she does remain persistently tachycardic.  I have started on IV antibiotics and I will consult the medical service for further treatment. Final Clinical Impression(s) / ED Diagnoses Final diagnoses:  Cellulitis of left upper extremity     Dorie Rank, MD 06/21/21 1105

## 2021-06-21 NOTE — Progress Notes (Signed)
   06/21/21 1341  Assess: MEWS Score  Temp (!) 103.3 F (39.6 C)  BP (!) 157/74  Pulse Rate (!) 132  Resp 20  SpO2 100 %  O2 Device Room Air  Assess: MEWS Score  MEWS Temp 2  MEWS Systolic 0  MEWS Pulse 3  MEWS RR 0  MEWS LOC 0  MEWS Score 5  MEWS Score Color Red  Assess: if the MEWS score is Yellow or Red  Were vital signs taken at a resting state? Yes  Focused Assessment Change from prior assessment (see assessment flowsheet)  Early Detection of Sepsis Score *See Row Information* Low  MEWS guidelines implemented *See Row Information* Yes  Treat  MEWS Interventions Administered scheduled meds/treatments;Escalated (See documentation below)  Pain Scale 0-10  Pain Score 0  Take Vital Signs  Increase Vital Sign Frequency  Red: Q 1hr X 4 then Q 4hr X 4, if remains red, continue Q 4hrs  Escalate  MEWS: Escalate Red: discuss with charge nurse/RN and provider, consider discussing with RRT  Notify: Charge Nurse/RN  Name of Charge Nurse/RN Notified Michele Mcalpine., RN  Date Charge Nurse/RN Notified 06/21/21  Time Charge Nurse/RN Notified 1341  Notify: Provider  Date Provider Notified 06/21/21  Time Provider Notified 1341  Notification Type Face-to-face  Notification Reason Change in status  Provider response At bedside;See new orders  Date of Provider Response 06/21/21  Time of Provider Response 1350  Document  Progress note created (see row info) Yes

## 2021-06-21 NOTE — Plan of Care (Signed)
  Problem: Education: Goal: Knowledge of General Education information will improve Description Including pain rating scale, medication(s)/side effects and non-pharmacologic comfort measures Outcome: Progressing   Problem: Health Behavior/Discharge Planning: Goal: Ability to manage health-related needs will improve Outcome: Progressing   

## 2021-06-21 NOTE — ED Triage Notes (Signed)
Pt brought in by EMS c/o a h/a since Sunday and left forearm redness and pain pt is no IV's and no B/P's to either arm due to lymphoid removal.  Pt has hx of migraines.

## 2021-06-21 NOTE — H&P (Signed)
History and Physical    Sharon Phillips YTK:[id number] DOB: 23-Dec-1970 DOA: 06/21/2021  PCP: Patient, [name]   Patient coming from: Home  Chief Complaint: Headache and pain/erythema to L arm  HPI: Sharon Phillips is a 50 y.o. female with medical history significant for breast cancer-currently off chemotherapy, hypertension, dyslipidemia, and depression who presented to the ED with complaints of worsening migraine headaches in the last couple days.  She does have a history of migraine headaches and takes multiple home medications for this.  She has not had any nausea, vomiting, fever, or neck stiffness.  She does have some redness noted to her left arm and this has been associated with swelling.  The redness has also expanded throughout her chest wall, but she was mostly unaware of this.  She denies any injuries to her arms.  No photophobia noted.   ED Course: Vital signs demonstrate elevated heart rates in the ED and minimal temperature elevation.  She is noted to have no leukocytosis and platelet count of 112,000.  Left upper extremity ultrasound was performed and there is no sign of DVT.  CT head with no acute findings.  Concern was for severity of cellulitis for which Unasyn was ordered by EDP.  Review of Systems: Reviewed as noted above, otherwise negative.  Past Medical History:  Diagnosis Date   Anxiety    Cancer ([place])    Depression    High cholesterol    Hypertension     Past Surgical History:  Procedure Laterality Date   ABDOMINAL HYSTERECTOMY     MASTECTOMY     bilateral     has no history on file for tobacco use, alcohol use, and drug use.  Allergies  Allergen Reactions   Hibiclens [Chlorhexidine Gluconate]     No family history on file.  Prior to Admission medications   Medication Sig Start Date End Date Taking? Authorizing Provider  acetaminophen (TYLENOL) 325 MG tablet Take 650 mg by mouth every 6 (six) hours as needed for mild pain, moderate pain,  fever or headache.   Yes [provider]  AJOVY 225 MG/1.5ML SOAJ Inject into the skin every 30 (thirty) days. 06/08/21  Yes [provider]  Butalbital-Acetaminophen 50-300 MG CAPS Take 1 capsule by mouth every 4 (four) hours as needed for headache. 02/11/20  Yes [provider]  cetirizine (ZYRTEC) 10 MG tablet Take 10 mg by mouth daily. 04/13/21  Yes [provider]  exemestane (AROMASIN) 25 MG tablet Take 25 mg by mouth daily. 05/30/21  Yes [provider]  gabapentin (NEURONTIN) 300 MG capsule Take 300 mg by mouth 2 (two) times daily. 04/13/21  Yes [provider]  lisinopril (ZESTRIL) 10 MG tablet Take 10 mg by mouth 2 (two) times daily. 06/07/21  Yes [provider]  meclizine (ANTIVERT) 12.5 MG tablet Take 12.5 mg by mouth daily as needed for dizziness.   Yes [provider]  metoprolol succinate (TOPROL-XL) 25 MG 24 hr tablet Take 25 mg by mouth daily. 04/13/21  Yes [provider]  ondansetron (ZOFRAN-ODT) 8 MG disintegrating tablet Take 8 mg by mouth every 8 (eight) hours as needed for nausea or vomiting. 05/04/21  Yes [provider]  QUEtiapine (SEROQUEL) 50 MG tablet Take 50 mg by mouth daily as needed (mood). 05/30/21  Yes [provider]  rosuvastatin (CRESTOR) 5 MG tablet Take 5 mg by mouth at bedtime. 03/02/21  Yes [provider]  sertraline (ZOLOFT) 50 MG tablet Take 50  mg by mouth daily.   Yes [provider]  SUMAtriptan (IMITREX) 25 MG tablet Take 25 mg by mouth every 2 (two) hours as needed for migraine. 08/20/20  Yes [provider]  topiramate (TOPAMAX) 25 MG tablet Take 25 mg by mouth 2 (two) times daily. 05/03/21  Yes [provider]  triamcinolone (KENALOG) 0.025 % cream Apply 1 application topically 2 (two) times daily. 04/13/21  Yes [provider]  venlafaxine XR (EFFEXOR-XR) 150 MG 24 hr capsule Take 150 mg by mouth daily. 04/18/21  Yes  [provider]    Physical Exam: Vitals:   06/21/21 1100 06/21/21 1115 06/21/21 1130 06/21/21 1200  BP: (!) 143/77  (!) 147/69 (!) 144/72  Pulse: (!) 128 (!) 130 (!) 129 (!) 131  Resp:      Temp:      TempSrc:      SpO2: 99% 99% 100% 100%  Weight:      Height:        Constitutional: NAD, calm, comfortable Vitals:   06/21/21 1100 06/21/21 1115 06/21/21 1130 06/21/21 1200  BP: (!) 143/77  (!) 147/69 (!) 144/72  Pulse: (!) 128 (!) 130 (!) 129 (!) 131  Resp:      Temp:      TempSrc:      SpO2: 99% 99% 100% 100%  Weight:      Height:       Eyes: lids and conjunctivae normal Neck: normal, supple Respiratory: clear to auscultation bilaterally. Normal respiratory effort. No accessory muscle use.  Cardiovascular: Regular rate and rhythm, no murmurs. Abdomen: no tenderness, no distention. Bowel sounds positive.  Musculoskeletal:  No edema. Skin: Significant erythema noted over left extremity as well as upper torso and breasts bilaterally.  No erythema noted over the face or neck.  Port to left chest wall without any significant tenderness or fluctuance noted. Psychiatric: Flat affect  Labs on Admission: I have personally reviewed following labs and imaging studies  CBC: Recent Labs  Lab 06/21/21 0852  WBC 5.8  HGB 14.3  HCT 42.8  MCV 94.7  PLT 366*   Basic Metabolic Panel: Recent Labs  Lab 06/21/21 0852  NA 135  K 3.6  CL 106  CO2 22  GLUCOSE 139*  BUN 13  CREATININE 0.87  CALCIUM 8.7*   GFR: Estimated Creatinine Clearance: 79.5 mL/min (by C-G formula based on SCr of 0.87 mg/dL). Liver Function Tests: No results for input(s): AST, ALT, ALKPHOS, BILITOT, PROT, ALBUMIN in the last 168 hours. No results for input(s): LIPASE, AMYLASE in the last 168 hours. No results for input(s): AMMONIA in the last 168 hours. Coagulation Profile: No results for input(s): INR, PROTIME in the last 168 hours. Cardiac Enzymes: No results for input(s): CKTOTAL, CKMB,  CKMBINDEX, TROPONINI in the last 168 hours. BNP (last 3 results) No results for input(s): PROBNP in the last 8760 hours. HbA1C: No results for input(s): HGBA1C in the last 72 hours. CBG: No results for input(s): GLUCAP in the last 168 hours. Lipid Profile: No results for input(s): CHOL, HDL, LDLCALC, TRIG, CHOLHDL, LDLDIRECT in the last 72 hours. Thyroid Function Tests: No results for input(s): TSH, T4TOTAL, FREET4, T3FREE, THYROIDAB in the last 72 hours. Anemia Panel: No results for input(s): VITAMINB12, FOLATE, FERRITIN, TIBC, IRON, RETICCTPCT in the last 72 hours. Urine analysis: No results found for: COLORURINE, APPEARANCEUR, [place], [place], GLUCOSEU, HGBUR, BILIRUBINUR, KETONESUR, PROTEINUR, UROBILINOGEN, NITRITE, LEUKOCYTESUR  Radiological Exams on Admission: CT Head Wo Contrast  Result Date: 06/21/2021 CLINICAL  DATA:  Headache. EXAM: CT HEAD WITHOUT CONTRAST TECHNIQUE: Contiguous axial images were obtained from the base of the skull through the vertex without intravenous contrast. COMPARISON:  None. FINDINGS: Brain: There is no evidence for acute hemorrhage, hydrocephalus, mass lesion, or abnormal extra-axial fluid collection. No definite CT evidence for acute infarction. Vascular: No hyperdense vessel or unexpected calcification. Skull: No evidence for fracture. No worrisome lytic or sclerotic lesion. Sinuses/Orbits: The visualized paranasal sinuses and mastoid air cells are clear. Visualized portions of the globes and intraorbital fat are unremarkable. Other: None. IMPRESSION: Unremarkable study.  No acute intracranial abnormality. Electronically Signed   By: Misty Stanley M.D.   On: 06/21/2021 08:24   US Venous Img Upper Uni Left  Result Date: 06/21/2021 CLINICAL DATA:  Left upper extremity swelling for 3 days EXAM: LEFT UPPER EXTREMITY VENOUS DOPPLER ULTRASOUND TECHNIQUE: Gray-scale sonography with graded compression, as well as color Doppler and duplex ultrasound were performed  to evaluate the upper extremity deep venous system from the level of the subclavian vein and including the jugular, axillary, basilic, radial, ulnar and upper cephalic vein. Spectral Doppler was utilized to evaluate flow at rest and with distal augmentation maneuvers. COMPARISON:  None. FINDINGS: Contralateral Subclavian Vein: Respiratory phasicity is normal and symmetric with the symptomatic side. No evidence of thrombus. Normal compressibility. Internal Jugular Vein: No evidence of thrombus. Normal compressibility, respiratory phasicity and response to augmentation. Subclavian Vein: No evidence of thrombus. Normal compressibility, respiratory phasicity and response to augmentation. Axillary Vein: No evidence of thrombus. Normal compressibility, respiratory phasicity and response to augmentation. Cephalic Vein: No evidence of thrombus. Normal compressibility, respiratory phasicity and response to augmentation. Basilic Vein: No evidence of thrombus. Normal compressibility, respiratory phasicity and response to augmentation. Brachial Veins: No evidence of thrombus. Normal compressibility, respiratory phasicity and response to augmentation. Radial Veins: No evidence of thrombus. Normal compressibility, respiratory phasicity and response to augmentation. Ulnar Veins: No evidence of thrombus. Normal compressibility, respiratory phasicity and response to augmentation. IMPRESSION: No evidence of DVT within the left upper extremity. Electronically Signed   By: Jerilynn Mages.  Shick M.D.   On: 06/21/2021 10:15     Assessment/Plan Active Problems:   Cellulitis    Left upper extremity and chest wall cellulitis -Currently nonpurulent, but quite extensive and painful -Left chest wall port without any significant tenderness or fluctuance noted; no concern for abscess -Plan to keep on IV antibiotics as prescribed -Areas of demarcation will be marked to assess clinical progress  Migraine headache -Noted to have history of  headaches at home -Continue home medications for pain management  Sinus tachycardia -Monitor on telemetry -Likely related to cellulitis above -Resume home metoprolol  Thrombocytopenia -Avoid heparin agents  History of hypertension/dyslipidemia -Continue home medications  History of depression -Continue home medications  DVT prophylaxis: SCDs Code Status: Full Family Communication: Spoke with son on phone 7/19 Disposition Plan:Admit for cellulitis treatment Consults called:None Admission status: Inpatient, Tele  [facility]  If 7PM-7AM, please contact night-coverage [email or web address]  06/21/2021, 12:26 PM

## 2021-06-22 ENCOUNTER — Inpatient Hospital Stay (HOSPITAL_COMMUNITY): Payer: Medicare Other

## 2021-06-22 DIAGNOSIS — T80219A Unspecified infection due to central venous catheter, initial encounter: Secondary | ICD-10-CM | POA: Diagnosis present

## 2021-06-22 DIAGNOSIS — Z853 Personal history of malignant neoplasm of breast: Secondary | ICD-10-CM

## 2021-06-22 DIAGNOSIS — L03114 Cellulitis of left upper limb: Secondary | ICD-10-CM | POA: Diagnosis not present

## 2021-06-22 DIAGNOSIS — R7881 Bacteremia: Secondary | ICD-10-CM

## 2021-06-22 DIAGNOSIS — R509 Fever, unspecified: Secondary | ICD-10-CM

## 2021-06-22 DIAGNOSIS — I10 Essential (primary) hypertension: Secondary | ICD-10-CM | POA: Diagnosis present

## 2021-06-22 DIAGNOSIS — D849 Immunodeficiency, unspecified: Secondary | ICD-10-CM | POA: Diagnosis present

## 2021-06-22 DIAGNOSIS — E7800 Pure hypercholesterolemia, unspecified: Secondary | ICD-10-CM | POA: Diagnosis present

## 2021-06-22 LAB — BASIC METABOLIC PANEL
Anion gap: 5 (ref 5–15)
BUN: 9 mg/dL (ref 6–20)
CO2: 21 mmol/L — ABNORMAL LOW (ref 22–32)
Calcium: 8.1 mg/dL — ABNORMAL LOW (ref 8.9–10.3)
Chloride: 108 mmol/L (ref 98–111)
Creatinine, Ser: 0.79 mg/dL (ref 0.44–1.00)
GFR, Estimated: >60 mL/min (ref >60)
Glucose, Bld: 119 mg/dL — ABNORMAL HIGH (ref 70–99)
Potassium: 3.7 mmol/L (ref 3.5–5.1)
Sodium: 134 mmol/L — ABNORMAL LOW (ref 135–145)

## 2021-06-22 LAB — BLOOD CULTURE ID PANEL (REFLEXED) - BCID2

## 2021-06-22 LAB — CBC
HCT: 36.9 % (ref 36.0–46.0)
Hemoglobin: 12.1 g/dL (ref 12.0–15.0)
MCH: 31.6 pg (ref 26.0–34.0)
MCHC: 32.8 g/dL (ref 30.0–36.0)
MCV: 96.3 fL (ref 80.0–100.0)
Platelets: 101 10*3/uL — ABNORMAL LOW (ref 150–400)
RBC: 3.83 MIL/uL — ABNORMAL LOW (ref 3.87–5.11)
RDW: 14 % (ref 11.5–15.5)
WBC: 4.2 10*3/uL (ref 4.0–10.5)
nRBC: 0 % (ref 0.0–0.2)

## 2021-06-22 LAB — MAGNESIUM: Magnesium: 1.9 mg/dL (ref 1.7–2.4)

## 2021-06-22 IMAGING — DX DG CHEST 1V PORT
1 series · 1 of 1 positions shown · non-contrast
Comparison: None.

CLINICAL DATA: Port-A-Cath verification.

EXAM:
PORTABLE CHEST 1 VIEW

[chest ap]
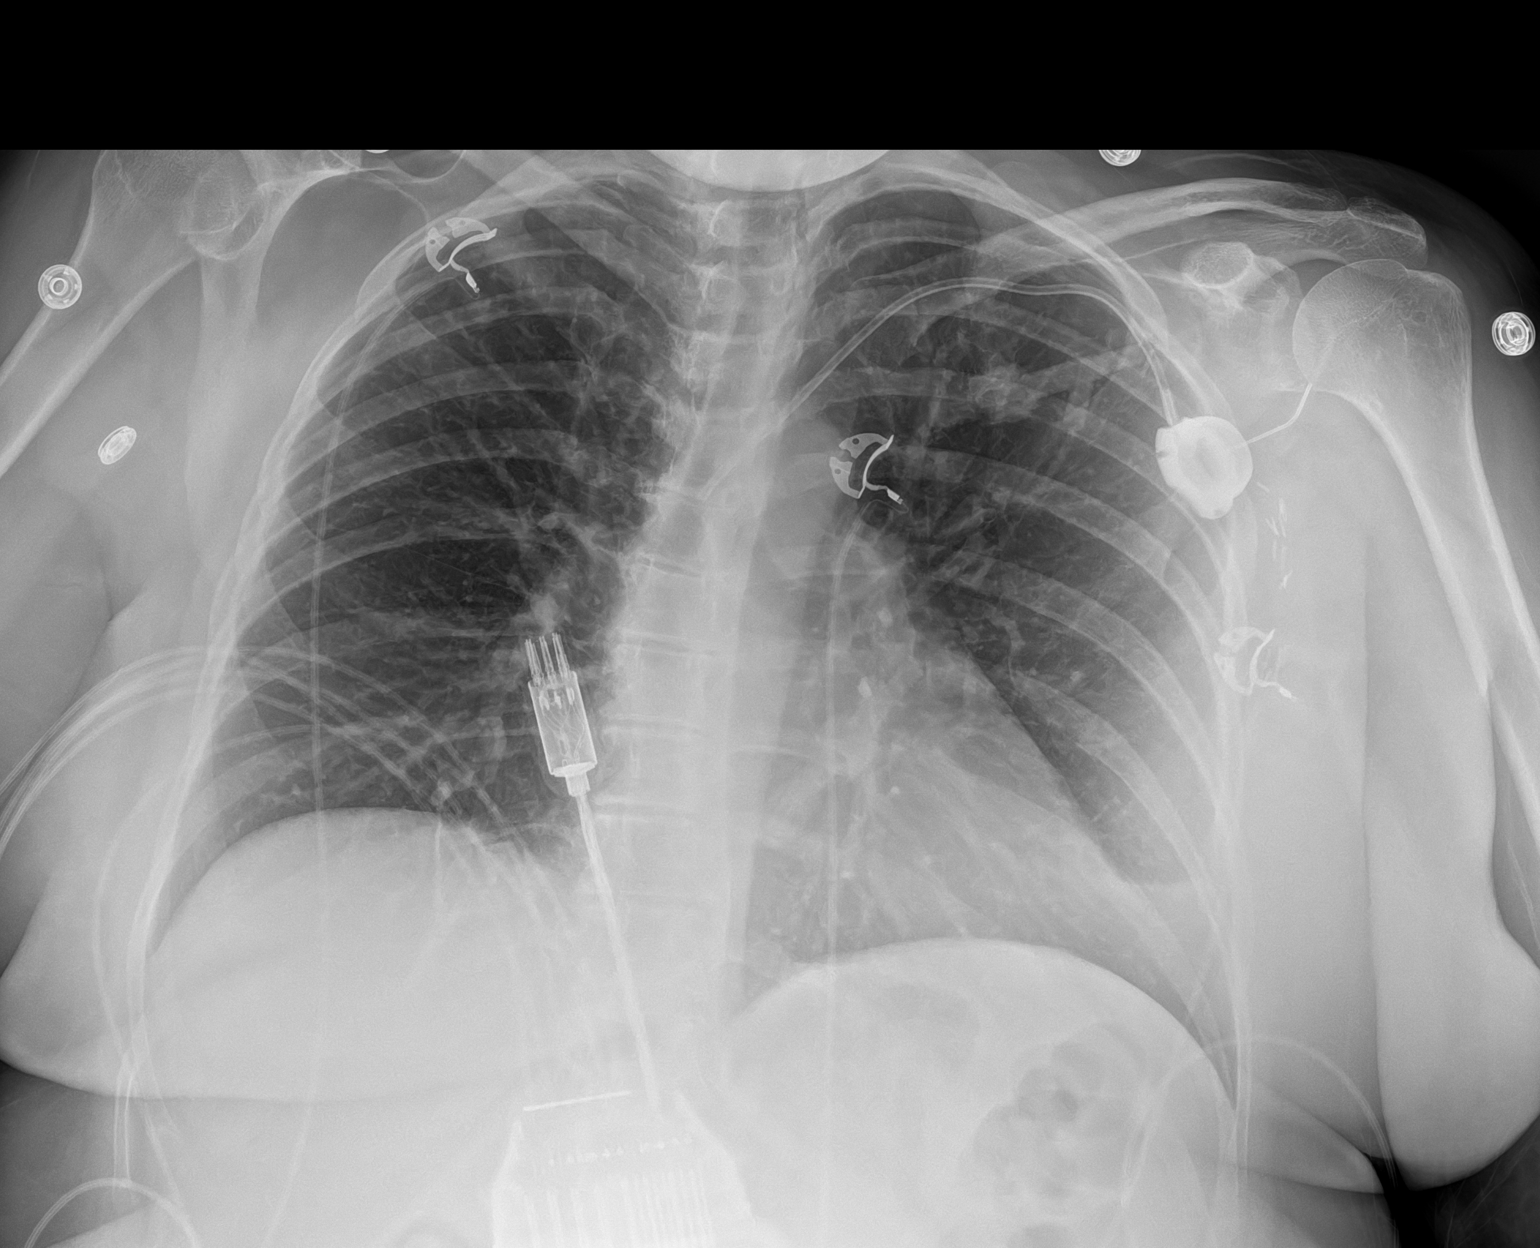

[1 of 1 positions shown; findings below may reference images not displayed]

FINDINGS: Accessed left chest port in place. The tip is in the region of the
upper SVC. No pneumothorax. Patient is mildly rotated. The heart is
normal in size. Normal mediastinal contours. Minimal left basilar
opacity felt to be a prominent epicardial fat pad. No confluent
airspace disease. No pleural effusion. No pneumothorax. Surgical
clips in the left axilla. No acute osseous abnormalities are seen.
IMPRESSION: Accessed left chest port in place with tip in the region of the
upper SVC. No pneumothorax.

## 2021-06-22 IMAGING — DX DG CHEST 1V PORT SAME DAY
1 series · 1 of 1 positions shown · non-contrast
Comparison: [DATE]

CLINICAL DATA: Port-A-Cath removed

EXAM:
PORTABLE CHEST 1 VIEW

[chest ap]
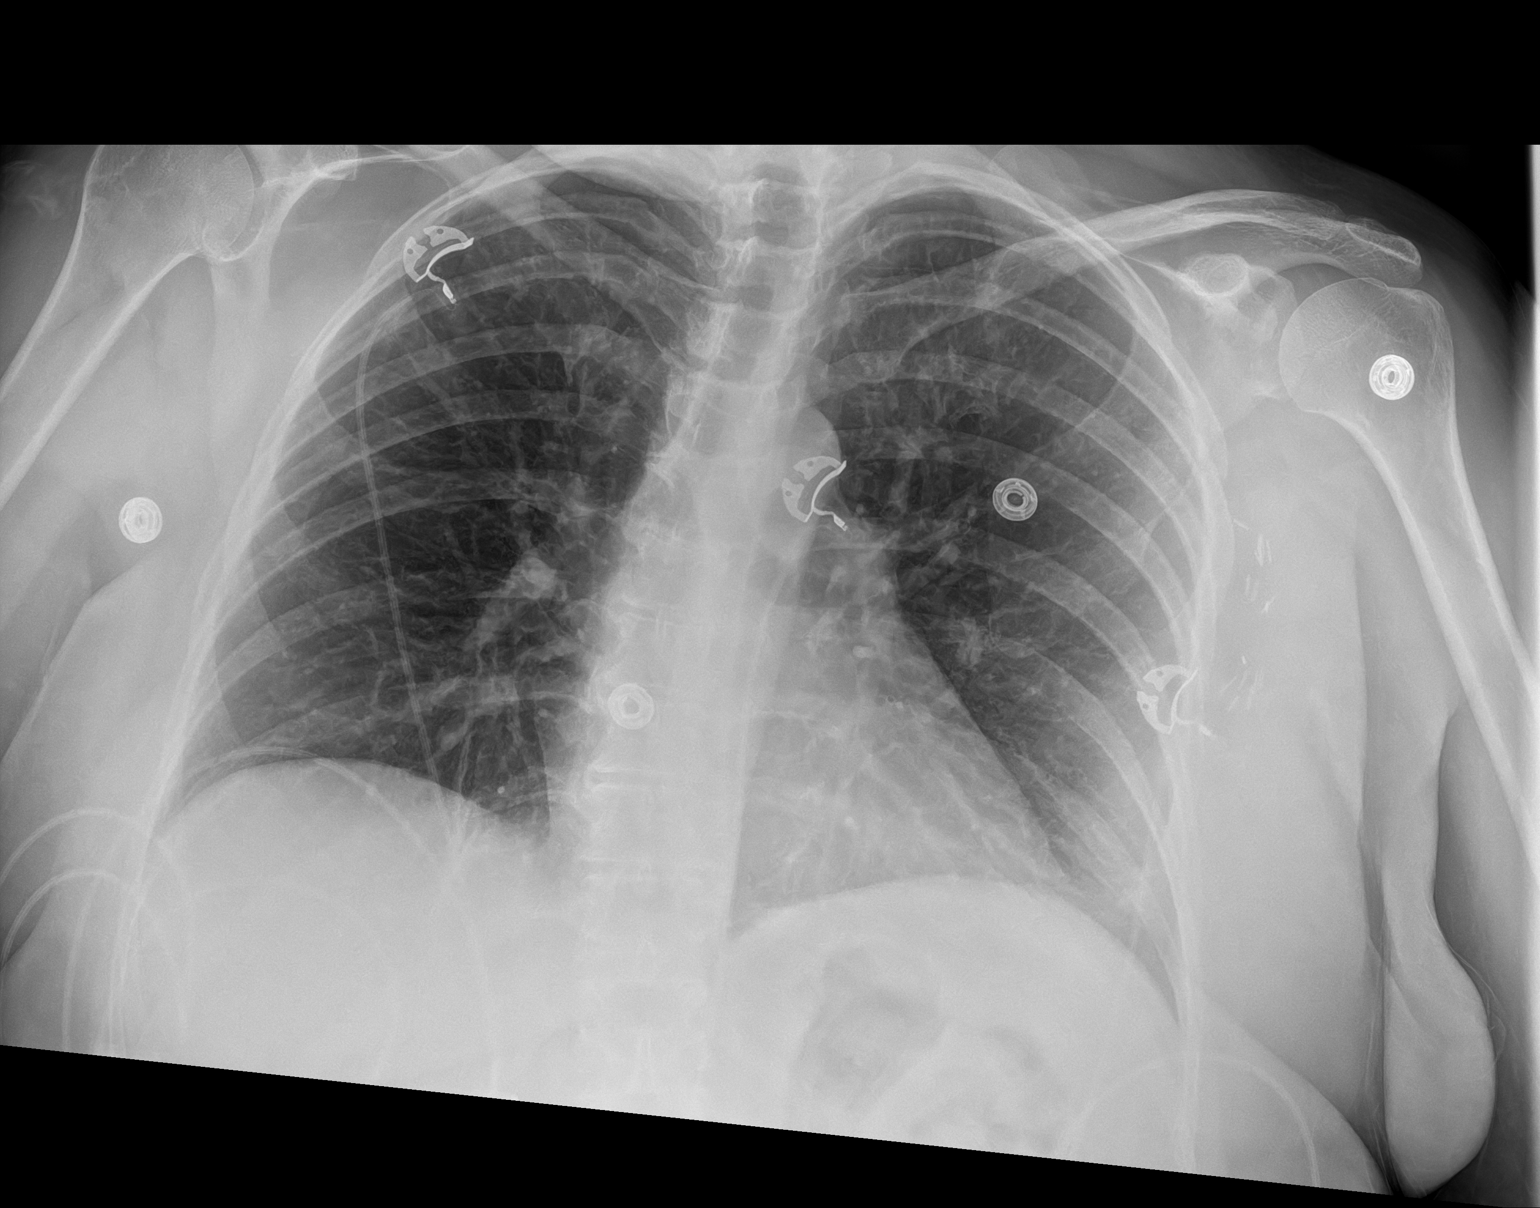

[1 of 1 positions shown; findings below may reference images not displayed]

FINDINGS: Removal of left-sided central venous port. Clips in the left axilla.
No focal opacity or pleural effusion. Normal heart size. No
pneumothorax
IMPRESSION: No active disease.  Removal of left-sided central venous port

## 2021-06-22 MED ORDER — LIDOCAINE HCL (PF) 1 % IJ SOLN: 20.0000 mL | Freq: Once | INTRAMUSCULAR | Status: DC

## 2021-06-22 MED ORDER — LIDOCAINE HCL (PF) 1 % IJ SOLN
30.0000 mL | Freq: Once | INTRAMUSCULAR | Status: DC
  Filled 2021-06-22: qty 30

## 2021-06-22 MED ORDER — LIDOCAINE HCL (PF) 1 % IJ SOLN
15.0000 mL | Freq: Once | INTRAMUSCULAR | Status: DC
Start: 2021-06-22 — End: 2021-06-27

## 2021-06-22 MED ORDER — LIDOCAINE HCL (PF) 1 % IJ SOLN
INTRAMUSCULAR | Status: AC
  Filled 2021-06-22: qty 15

## 2021-06-22 MED ORDER — LIDOCAINE HCL (PF) 1 % IJ SOLN
30.0000 mL | Freq: Once | INTRAMUSCULAR | Status: DC
Start: 2021-06-22 — End: 2021-06-22
  Filled 2021-06-22: qty 30

## 2021-06-22 MED ORDER — SODIUM CHLORIDE 0.9 % IV SOLN: INTRAVENOUS | Status: DC

## 2021-06-22 NOTE — Progress Notes (Signed)
Patient got up and washed up with assist of daughter at the sink. After doing so, presented with headache. Provided toradol IV. Instructed to call if it is ineffective. Rash remains unchanged.

## 2021-06-22 NOTE — H&P (View-Only) (Signed)
[place]  Reason for Consult: Cellulitis, bacteremia, port in place  Referring Physician:  Dr. Wynetta Emery   Chief Complaint   Headache     HPI: Sharon Phillips is a 50 y.o. female with with a history of breast cancer s/p bilateral mastectomy and axillary node dissection, and hysterectomy in [place] in 2019.  She has had the port since that time. The last time the port was used was about 1 month prior when labs were attempted. She just moved up her in [place], and is in the process of moving her physicians for surveillance. She started having a rash on Monday that started on her left arm and moved onto her chest and now onto her back.  She also had an associated headache.   The cellulitis has now moved across her chest and is right at her left port incision, the port is accessed from the ED. She has bacteremia.   Past Medical History:  Diagnosis Date   Anxiety    Cancer ([place])    Depression    High cholesterol    Hypertension     Past Surgical History:  Procedure Laterality Date   ABDOMINAL HYSTERECTOMY     MASTECTOMY     bilateral    History reviewed. No pertinent family history.  Social History   Tobacco Use   Smoking status: Never   Smokeless tobacco: Never  Substance Use Topics   Alcohol use: Never   Drug use: Never    Medications: I have reviewed the patient's current medications. Prior to Admission:  Medications Prior to Admission  Medication Sig Dispense Refill Last Dose   acetaminophen (TYLENOL) 325 MG tablet Take 650 mg by mouth every 6 (six) hours as needed for mild pain, moderate pain, fever or headache.   PRN   AJOVY 225 MG/1.5ML SOAJ Inject into the skin every 30 (thirty) days.   06/09/2021   Butalbital-Acetaminophen 50-300 MG CAPS Take 1 capsule by mouth every 4 (four) hours as needed for headache.   PRN   cetirizine (ZYRTEC) 10 MG tablet Take 10 mg by mouth daily.   06/20/2021   exemestane (AROMASIN) 25 MG tablet Take 25 mg by mouth daily.    06/20/2021   gabapentin (NEURONTIN) 300 MG capsule Take 300 mg by mouth 2 (two) times daily.   06/20/2021   lisinopril (ZESTRIL) 10 MG tablet Take 10 mg by mouth 2 (two) times daily.   06/20/2021   meclizine (ANTIVERT) 12.5 MG tablet Take 12.5 mg by mouth daily as needed for dizziness.   PRN   metoprolol succinate (TOPROL-XL) 25 MG 24 hr tablet Take 25 mg by mouth daily.   06/20/2021 at 1000   ondansetron (ZOFRAN-ODT) 8 MG disintegrating tablet Take 8 mg by mouth every 8 (eight) hours as needed for nausea or vomiting.   Past Week   QUEtiapine (SEROQUEL) 50 MG tablet Take 50 mg by mouth daily as needed (mood).   Past Week   rosuvastatin (CRESTOR) 5 MG tablet Take 5 mg by mouth at bedtime.   06/20/2021   sertraline (ZOLOFT) 50 MG tablet Take 50 mg by mouth daily.   06/20/2021   SUMAtriptan (IMITREX) 25 MG tablet Take 25 mg by mouth every 2 (two) hours as needed for migraine.   PRN   topiramate (TOPAMAX) 25 MG tablet Take 25 mg by mouth 2 (two) times daily.   06/20/2021   triamcinolone (KENALOG) 0.025 % cream Apply 1 application topically 2 (two) times daily.   Past Month  venlafaxine XR (EFFEXOR-XR) 150 MG 24 hr capsule Take 150 mg by mouth daily.   06/20/2021   Scheduled:  lidocaine (PF)       exemestane  25 mg Oral Daily   gabapentin  300 mg Oral BID   lidocaine (PF)  15 mL Intradermal Once   loratadine  10 mg Oral Daily   metoprolol succinate  25 mg Oral Daily   rosuvastatin  5 mg Oral QHS   sertraline  50 mg Oral Daily   topiramate  25 mg Oral BID   venlafaxine XR  150 mg Oral Daily   Continuous:  sodium chloride 50 mL/hr at 06/22/21 1350    ceFAZolin (ANCEF) IV 2 g (06/22/21 1420)   WJX:BJYNWGNFAOZHY **OR** acetaminophen, butalbital-acetaminophen-caffeine, ketorolac, meclizine, ondansetron **OR** ondansetron (ZOFRAN) IV, QUEtiapine, SUMAtriptan  Allergies  Allergen Reactions   Hibiclens [Chlorhexidine Gluconate]      ROS:  A comprehensive review of systems was negative except for:  Integument/breast: positive for history of mastectomy bilateral cellulitis across chest back and left arm Low grade fevers, bacteremia   Blood pressure 139/87, pulse (!) 118, temperature 99.6 F (37.6 C), temperature source Oral, resp. rate 18, height 5\' 4"  (1.626 m), weight 77.1 kg, SpO2 99 %. Physical Exam Vitals reviewed.  Constitutional:      Appearance: She is well-developed.  HENT:     Head: Normocephalic.  Eyes:     Extraocular Movements: Extraocular movements intact.  Cardiovascular:     Rate and Rhythm: Normal rate.  Pulmonary:     Effort: Pulmonary effort is normal.  Abdominal:     General: There is no distension.     Palpations: Abdomen is soft.     Tenderness: There is no abdominal tenderness.  Musculoskeletal:        General: Normal range of motion.     Cervical back: Normal range of motion.  Skin:    General: Skin is warm.     Comments: Cellulitis on the chest bilateral, up to the port on the left side, port accessed, left arm with cellulitis and swelling, right arm without swelling or erythema   Neurological:     Mental Status: She is alert. Mental status is at baseline.  Psychiatric:        Mood and Affect: Mood normal.        Behavior: Behavior normal.    Results: Results for orders placed or performed during the hospital encounter of 06/21/21 (from the past 48 hour(s))  Blood culture (routine x 2)     Status: None (Preliminary result)   Collection Time: 06/21/21  7:33 AM   Specimen: Chest; Blood  Result Value Ref Range   Specimen Description CHEST BOTTLES DRAWN AEROBIC AND ANAEROBIC    Special Requests Blood Culture adequate volume    Culture  Setup Time      GRAM POSITIVE COCCI IN BOTH AEROBIC AND ANAEROBIC BOTTLES Gram Stain Report Called to,Read Back By and Verified With: brown,p @ 2133 by matthews,b 7.19.22 [facility]    Culture      NO GROWTH < 24 HOURS Performed at [facility], [address] [place], [place] [address]    Report  Status PENDING   HIV Antibody (routine testing w rflx)     Status: None   Collection Time: 06/21/21  7:33 AM  Result Value Ref Range   HIV Screen 4th Generation wRfx Non Reactive Non Reactive    Comment: Performed at [facility], [place] [address]  [place], [place] [address]  Blood culture (routine x 2)     Status: None (Preliminary result)   Collection Time: 06/21/21  8:51 AM   Specimen: Chest; Blood  Result Value Ref Range   Specimen Description      CHEST BOTTLES DRAWN AEROBIC AND ANAEROBIC Performed at [facility], [address], [place], [place] [address]    Special Requests      Blood Culture adequate volume Performed at [facility], [address], [place], [place] [address]    Culture  Setup Time      GRAM POSITIVE COCCI IN BOTH AEROBIC AND ANAEROBIC BOTTLES Gram Stain Report Called to,Read Back By and Verified With: Brown,P @2133  by Matthews,B 7.19.22 [facility] Organism ID to follow Performed at [facility], [place] [address] [place], [place] [address]    Culture GRAM POSITIVE COCCI    Report Status PENDING   Blood Culture ID Panel (Reflexed)     Status: Abnormal   Collection Time: 06/21/21  8:51 AM  Result Value Ref Range   Enterococcus faecalis NOT DETECTED NOT DETECTED   Enterococcus Faecium NOT DETECTED NOT DETECTED   Listeria monocytogenes NOT DETECTED NOT DETECTED   Staphylococcus species NOT DETECTED NOT DETECTED   Staphylococcus aureus (BCID) NOT DETECTED NOT DETECTED   Staphylococcus epidermidis NOT DETECTED NOT DETECTED   Staphylococcus lugdunensis NOT DETECTED NOT DETECTED   Streptococcus species DETECTED (A) NOT DETECTED    Comment: Not Enterococcus species, Streptococcus agalactiae, Streptococcus pyogenes, or Streptococcus pneumoniae. CRITICAL RESULT CALLED TO, READ BACK BY AND VERIFIED WITH: P. BROWN RN, AT 0321 06/22/21 D. VANHOOK    Streptococcus agalactiae NOT DETECTED NOT DETECTED   Streptococcus pneumoniae NOT DETECTED NOT  DETECTED   Streptococcus pyogenes NOT DETECTED NOT DETECTED   A.calcoaceticus-baumannii NOT DETECTED NOT DETECTED   Bacteroides fragilis NOT DETECTED NOT DETECTED   Enterobacterales NOT DETECTED NOT DETECTED   Enterobacter cloacae complex NOT DETECTED NOT DETECTED   Escherichia coli NOT DETECTED NOT DETECTED   Klebsiella aerogenes NOT DETECTED NOT DETECTED   Klebsiella oxytoca NOT DETECTED NOT DETECTED   Klebsiella pneumoniae NOT DETECTED NOT DETECTED   Proteus species NOT DETECTED NOT DETECTED   Salmonella species NOT DETECTED NOT DETECTED   Serratia marcescens NOT DETECTED NOT DETECTED   Haemophilus influenzae NOT DETECTED NOT DETECTED   Neisseria meningitidis NOT DETECTED NOT DETECTED   Pseudomonas aeruginosa NOT DETECTED NOT DETECTED   Stenotrophomonas maltophilia NOT DETECTED NOT DETECTED   Candida albicans NOT DETECTED NOT DETECTED   Candida auris NOT DETECTED NOT DETECTED   Candida glabrata NOT DETECTED NOT DETECTED   Candida krusei NOT DETECTED NOT DETECTED   Candida parapsilosis NOT DETECTED NOT DETECTED   Candida tropicalis NOT DETECTED NOT DETECTED   Cryptococcus neoformans/gattii NOT DETECTED NOT DETECTED    Comment: Performed at [facility], [address], [place], [place] [address]  CBC     Status: Abnormal   Collection Time: 06/21/21  8:52 AM  Result Value Ref Range   WBC 5.8 4.0 - 10.5 K/uL   RBC 4.52 3.87 - 5.11 MIL/uL   Hemoglobin 14.3 12.0 - 15.0 g/dL   HCT 42.8 36.0 - 46.0 %   MCV 94.7 80.0 - 100.0 fL   MCH 31.6 26.0 - 34.0 pg   MCHC 33.4 30.0 - 36.0 g/dL   RDW 13.8 11.5 - 15.5 %   Platelets 112 (L) 150 - 400 K/uL    Comment: SPECIMEN CHECKED FOR CLOTS REPEATED TO  VERIFY PLATELET COUNT CONFIRMED BY SMEAR    nRBC 0.0 0.0 - 0.2 %    Comment: Performed at [facility], [address], [place], [place] [address]  Basic metabolic panel     Status: Abnormal   Collection Time: 06/21/21  8:52 AM  Result Value Ref Range   Sodium 135 135 - 145 mmol/L    Potassium 3.6 3.5 - 5.1 mmol/L   Chloride 106 98 - 111 mmol/L   CO2 22 22 - 32 mmol/L   Glucose, Bld 139 (H) 70 - 99 mg/dL    Comment: Glucose reference range applies only to samples taken after fasting for at least 8 hours.   BUN 13 6 - 20 mg/dL   Creatinine, Ser 0.87 0.44 - 1.00 mg/dL   Calcium 8.7 (L) 8.9 - 10.3 mg/dL   GFR, Estimated >60 >60 mL/min    Comment: (NOTE) Calculated using the CKD-EPI Creatinine Equation (2021)    Anion gap 7 5 - 15    Comment: Performed at [facility], [address] [place],  [address]  Resp Panel by RT-PCR (Flu A&B, Covid) Nasopharyngeal Swab     Status: None   Collection Time: 06/21/21  8:52 AM   Specimen: Nasopharyngeal Swab; Nasopharyngeal(NP) swabs in vial transport medium  Result Value Ref Range   SARS Coronavirus 2 by RT PCR NEGATIVE NEGATIVE    Comment: (NOTE) SARS-CoV-2 target nucleic acids are NOT DETECTED.  The SARS-CoV-2 RNA is generally detectable in upper respiratory specimens during the acute phase of infection. The lowest concentration of SARS-CoV-2 viral copies this assay can detect is 138 copies/mL. A negative result does not preclude SARS-Cov-2 infection and should not be used as the sole basis for treatment or other patient management decisions. A negative result may occur with  improper specimen collection/handling, submission of specimen other than nasopharyngeal swab, presence of viral mutation(s) within the areas targeted by this assay, and inadequate number of viral copies(<138 copies/mL). A negative result must be combined with clinical observations, patient history, and epidemiological information. The expected result is Negative.  Fact Sheet for Patients:  EntrepreneurPulse.com.au  Fact Sheet for Healthcare Providers:  IncredibleEmployment.be  This test is no t yet approved or cleared by the [place] FDA and  has been authorized for detection and/or diagnosis of  SARS-CoV-2 by FDA under an Emergency Use Authorization (EUA). This EUA will remain  in effect (meaning this test can be used) for the duration of the COVID-19 declaration under Section 564(b)(1) of the Act, 21 U.S.C.section 360bbb-3(b)(1), unless the authorization is terminated  or revoked sooner.       Influenza A by PCR NEGATIVE NEGATIVE   Influenza B by PCR NEGATIVE NEGATIVE    Comment: (NOTE) The Xpert Xpress SARS-CoV-2/FLU/RSV plus assay is intended as an aid in the diagnosis of influenza from Nasopharyngeal swab specimens and should not be used as a sole basis for treatment. Nasal washings and aspirates are unacceptable for Xpert Xpress SARS-CoV-2/FLU/RSV testing.  Fact Sheet for Patients: EntrepreneurPulse.com.au  Fact Sheet for Healthcare Providers: IncredibleEmployment.be  This test is not yet approved or cleared by the [place] FDA and has been authorized for detection and/or diagnosis of SARS-CoV-2 by FDA under an Emergency Use Authorization (EUA). This EUA will remain in effect (meaning this test can be used) for the duration of the COVID-19 declaration under Section 564(b)(1) of the Act, 21 U.S.C. section 360bbb-3(b)(1), unless the authorization is terminated or revoked.  Performed at [facility], [address],  [place], [place] [address]   Lactic acid, plasma     Status: None   Collection Time: 06/21/21  9:10 AM  Result Value Ref Range   Lactic Acid, Venous 0.9 0.5 - 1.9 mmol/L    Comment: Performed at [facility], [address] [place], [place] [address]  Magnesium     Status: None   Collection Time: 06/22/21  5:24 AM  Result Value Ref Range   Magnesium 1.9 1.7 - 2.4 mg/dL    Comment: Performed at [facility], [address], [place], [place] [address]  Basic metabolic panel     Status: Abnormal   Collection Time: 06/22/21  5:24 AM  Result Value Ref Range   Sodium 134 (L) 135 - 145 mmol/L   Potassium 3.7 3.5 -  5.1 mmol/L   Chloride 108 98 - 111 mmol/L   CO2 21 (L) 22 - 32 mmol/L   Glucose, Bld 119 (H) 70 - 99 mg/dL    Comment: Glucose reference range applies only to samples taken after fasting for at least 8 hours.   BUN 9 6 - 20 mg/dL   Creatinine, Ser 0.79 0.44 - 1.00 mg/dL   Calcium 8.1 (L) 8.9 - 10.3 mg/dL   GFR, Estimated >60 >60 mL/min    Comment: (NOTE) Calculated using the CKD-EPI Creatinine Equation (2021)    Anion gap 5 5 - 15    Comment: Performed at [facility], [address], [place], [place] [address]  CBC     Status: Abnormal   Collection Time: 06/22/21  5:24 AM  Result Value Ref Range   WBC 4.2 4.0 - 10.5 K/uL   RBC 3.83 (L) 3.87 - 5.11 MIL/uL   Hemoglobin 12.1 12.0 - 15.0 g/dL   HCT 36.9 36.0 - 46.0 %   MCV 96.3 80.0 - 100.0 fL   MCH 31.6 26.0 - 34.0 pg   MCHC 32.8 30.0 - 36.0 g/dL   RDW 14.0 11.5 - 15.5 %   Platelets 101 (L) 150 - 400 K/uL    Comment: SPECIMEN CHECKED FOR CLOTS Immature Platelet Fraction may be clinically indicated, consider ordering this additional test [id number] CONSISTENT WITH PREVIOUS RESULT    nRBC 0.0 0.0 - 0.2 %    Comment: Performed at [facility], [address], [place], [place] [address]   CXR reviewed- subclavian port in left  CT Head Wo Contrast  Result Date: 06/21/2021 CLINICAL DATA:  Headache. EXAM: CT HEAD WITHOUT CONTRAST TECHNIQUE: Contiguous axial images were obtained from the base of the skull through the vertex without intravenous contrast. COMPARISON:  None. FINDINGS: Brain: There is no evidence for acute hemorrhage, hydrocephalus, mass lesion, or abnormal extra-axial fluid collection. No definite CT evidence for acute infarction. Vascular: No hyperdense vessel or unexpected calcification. Skull: No evidence for fracture. No worrisome lytic or sclerotic lesion. Sinuses/Orbits: The visualized paranasal sinuses and mastoid air cells are clear. Visualized portions of the globes and intraorbital fat are unremarkable. Other: None.  IMPRESSION: Unremarkable study.  No acute intracranial abnormality. Electronically Signed   By: Misty Stanley M.D.   On: 06/21/2021 08:24   US Venous Img Upper Uni Left  Result Date: 06/21/2021 CLINICAL DATA:  Left upper extremity swelling for 3 days EXAM: LEFT UPPER EXTREMITY VENOUS DOPPLER ULTRASOUND TECHNIQUE: Gray-scale sonography with graded compression, as well as color Doppler and duplex ultrasound were performed to evaluate the upper extremity deep venous system from the level of the subclavian vein and including the jugular, axillary, basilic, radial, ulnar and  upper cephalic vein. Spectral Doppler was utilized to evaluate flow at rest and with distal augmentation maneuvers. COMPARISON:  None. FINDINGS: Contralateral Subclavian Vein: Respiratory phasicity is normal and symmetric with the symptomatic side. No evidence of thrombus. Normal compressibility. Internal Jugular Vein: No evidence of thrombus. Normal compressibility, respiratory phasicity and response to augmentation. Subclavian Vein: No evidence of thrombus. Normal compressibility, respiratory phasicity and response to augmentation. Axillary Vein: No evidence of thrombus. Normal compressibility, respiratory phasicity and response to augmentation. Cephalic Vein: No evidence of thrombus. Normal compressibility, respiratory phasicity and response to augmentation. Basilic Vein: No evidence of thrombus. Normal compressibility, respiratory phasicity and response to augmentation. Brachial Veins: No evidence of thrombus. Normal compressibility, respiratory phasicity and response to augmentation. Radial Veins: No evidence of thrombus. Normal compressibility, respiratory phasicity and response to augmentation. Ulnar Veins: No evidence of thrombus. Normal compressibility, respiratory phasicity and response to augmentation. IMPRESSION: No evidence of DVT within the left upper extremity. Electronically Signed   By: Jerilynn Mages.  Shick M.D.   On: 06/21/2021 10:15    DG Chest Port 1 View  Result Date: 06/22/2021 CLINICAL DATA:  Port-A-Cath verification. EXAM: PORTABLE CHEST 1 VIEW COMPARISON:  None. FINDINGS: Accessed left chest port in place. The tip is in the region of the upper SVC. No pneumothorax. Patient is mildly rotated. The heart is normal in size. Normal mediastinal contours. Minimal left basilar opacity felt to be a prominent epicardial fat pad. No confluent airspace disease. No pleural effusion. No pneumothorax. Surgical clips in the left axilla. No acute osseous abnormalities are seen. IMPRESSION: Accessed left chest port in place with tip in the region of the upper SVC. No pneumothorax. Electronically Signed   By: Keith Rake M.D.   On: 06/22/2021 18:17     Assessment & Plan:  Dameisha Tschida is a 50 y.o. female with bacteremia and cellulitis who needs her port removed. RN asked to place right arm IV given the need for access and limited access. The risk of lymphedema for a short term IV versus worsening infection or injury from CVL was considered and IV in the right arm is more appropriate. Discussed this with the patient. RN placed IV and port deaccessed.  Port removed at the bedside. Consent obtained and discussed risk of bleeding, worsening infection, packing wound.   Procedure: Port removal Diagnosis: Cellulitis, port in place, bacteremia  Description: The left chest was prepped with betadine. Lidocaine was injected. Using a scalpel the prior incision was opened and carried down to the subcutaneous tissues with scissors. No purulence was noted. The port was removed in its entirety with sharp dissection. Pressure was held on the subclavian vein for 10 minutes. The cavity was left open and packed with saline dampened gauze and paper tape. The patient tolerated the procedure well.   Lidocaine 1% 15 mL used.   All questions were answered to the satisfaction of the patient and family.  Daily dressing changes starting tomorrow.  CXR to  confirm complete removal.    Virl Cagey 06/22/2021, 7:40 PM

## 2021-06-22 NOTE — Progress Notes (Signed)
Patient has well developed rash to anterior and posterior chest, bilat arms. Left arm with slight bumps, but as of now, no blisters or drainage. No areas of blister to any other parts of rash. It wraps around from the front to the back. She states yesterday it was much more painful, and could not move her arms, while today she can move her arms a bit more, but skin is very tender. Have gotten the information to retrieve records from her physicians in [place]. Patient's daughter is at bedside.

## 2021-06-22 NOTE — Consult Note (Addendum)
[facility]  Reason for Consult: Cellulitis, bacteremia, port in place  Referring Physician:  Dr. Wynetta Emery   Chief Complaint   Headache     HPI: Sharon Phillips is a 50 y.o. female with with a history of breast cancer s/p bilateral mastectomy and axillary node dissection, and hysterectomy in [place] in 2019.  She has had the port since that time. The last time the port was used was about 1 month prior when labs were attempted. She just moved up her in [place], and is in the process of moving her physicians for surveillance. She started having a rash on Monday that started on her left arm and moved onto her chest and now onto her back.  She also had an associated headache.   The cellulitis has now moved across her chest and is right at her left port incision, the port is accessed from the ED. She has bacteremia.   Past Medical History:  Diagnosis Date   Anxiety    Cancer ([place])    Depression    High cholesterol    Hypertension     Past Surgical History:  Procedure Laterality Date   ABDOMINAL HYSTERECTOMY     MASTECTOMY     bilateral    History reviewed. No pertinent family history.  Social History   Tobacco Use   Smoking status: Never   Smokeless tobacco: Never  Substance Use Topics   Alcohol use: Never   Drug use: Never    Medications: I have reviewed the patient's current medications. Prior to Admission:  Medications Prior to Admission  Medication Sig Dispense Refill Last Dose   acetaminophen (TYLENOL) 325 MG tablet Take 650 mg by mouth every 6 (six) hours as needed for mild pain, moderate pain, fever or headache.   PRN   AJOVY 225 MG/1.5ML SOAJ Inject into the skin every 30 (thirty) days.   06/09/2021   Butalbital-Acetaminophen 50-300 MG CAPS Take 1 capsule by mouth every 4 (four) hours as needed for headache.   PRN   cetirizine (ZYRTEC) 10 MG tablet Take 10 mg by mouth daily.   06/20/2021   exemestane (AROMASIN) 25 MG tablet Take 25 mg by mouth daily.    06/20/2021   gabapentin (NEURONTIN) 300 MG capsule Take 300 mg by mouth 2 (two) times daily.   06/20/2021   lisinopril (ZESTRIL) 10 MG tablet Take 10 mg by mouth 2 (two) times daily.   06/20/2021   meclizine (ANTIVERT) 12.5 MG tablet Take 12.5 mg by mouth daily as needed for dizziness.   PRN   metoprolol succinate (TOPROL-XL) 25 MG 24 hr tablet Take 25 mg by mouth daily.   06/20/2021 at 1000   ondansetron (ZOFRAN-ODT) 8 MG disintegrating tablet Take 8 mg by mouth every 8 (eight) hours as needed for nausea or vomiting.   Past Week   QUEtiapine (SEROQUEL) 50 MG tablet Take 50 mg by mouth daily as needed (mood).   Past Week   rosuvastatin (CRESTOR) 5 MG tablet Take 5 mg by mouth at bedtime.   06/20/2021   sertraline (ZOLOFT) 50 MG tablet Take 50 mg by mouth daily.   06/20/2021   SUMAtriptan (IMITREX) 25 MG tablet Take 25 mg by mouth every 2 (two) hours as needed for migraine.   PRN   topiramate (TOPAMAX) 25 MG tablet Take 25 mg by mouth 2 (two) times daily.   06/20/2021   triamcinolone (KENALOG) 0.025 % cream Apply 1 application topically 2 (two) times daily.   Past Month  venlafaxine XR (EFFEXOR-XR) 150 MG 24 hr capsule Take 150 mg by mouth daily.   06/20/2021   Scheduled:  lidocaine (PF)       exemestane  25 mg Oral Daily   gabapentin  300 mg Oral BID   lidocaine (PF)  15 mL Intradermal Once   loratadine  10 mg Oral Daily   metoprolol succinate  25 mg Oral Daily   rosuvastatin  5 mg Oral QHS   sertraline  50 mg Oral Daily   topiramate  25 mg Oral BID   venlafaxine XR  150 mg Oral Daily   Continuous:  sodium chloride 50 mL/hr at 06/22/21 1350    ceFAZolin (ANCEF) IV 2 g (06/22/21 1420)   ZTI:[id number] **OR** acetaminophen, butalbital-acetaminophen-caffeine, ketorolac, meclizine, ondansetron **OR** ondansetron (ZOFRAN) IV, QUEtiapine, SUMAtriptan  Allergies  Allergen Reactions   Hibiclens [Chlorhexidine Gluconate]      ROS:  A comprehensive review of systems was negative except for:  Integument/breast: positive for history of mastectomy bilateral cellulitis across chest back and left arm Low grade fevers, bacteremia   Blood pressure 139/87, pulse (!) 118, temperature 99.6 F (37.6 C), temperature source Oral, resp. rate 18, height 5\' 4"  (1.626 m), weight 77.1 kg, SpO2 99 %. Physical Exam Vitals reviewed.  Constitutional:      Appearance: She is well-developed.  HENT:     Head: Normocephalic.  Eyes:     Extraocular Movements: Extraocular movements intact.  Cardiovascular:     Rate and Rhythm: Normal rate.  Pulmonary:     Effort: Pulmonary effort is normal.  Abdominal:     General: There is no distension.     Palpations: Abdomen is soft.     Tenderness: There is no abdominal tenderness.  Musculoskeletal:        General: Normal range of motion.     Cervical back: Normal range of motion.  Skin:    General: Skin is warm.     Comments: Cellulitis on the chest bilateral, up to the port on the left side, port accessed, left arm with cellulitis and swelling, right arm without swelling or erythema   Neurological:     Mental Status: She is alert. Mental status is at baseline.  Psychiatric:        Mood and Affect: Mood normal.        Behavior: Behavior normal.    Results: Results for orders placed or performed during the hospital encounter of 06/21/21 (from the past 48 hour(s))  Blood culture (routine x 2)     Status: None (Preliminary result)   Collection Time: 06/21/21  7:33 AM   Specimen: Chest; Blood  Result Value Ref Range   Specimen Description CHEST BOTTLES DRAWN AEROBIC AND ANAEROBIC    Special Requests Blood Culture adequate volume    Culture  Setup Time      GRAM POSITIVE COCCI IN BOTH AEROBIC AND ANAEROBIC BOTTLES Gram Stain Report Called to,Read Back By and Verified With: brown,p @ 2133 by matthews,b 7.19.22 [facility]    Culture      NO GROWTH < 24 HOURS Performed at [facility], [address] [place], [place] [address]    Report  Status PENDING   HIV Antibody (routine testing w rflx)     Status: None   Collection Time: 06/21/21  7:33 AM  Result Value Ref Range   HIV Screen 4th Generation wRfx Non Reactive Non Reactive    Comment: Performed at [facility], [place] [address],  [place], [place] [address]  Blood culture (routine x 2)     Status: None (Preliminary result)   Collection Time: 06/21/21  8:51 AM   Specimen: Chest; Blood  Result Value Ref Range   Specimen Description      CHEST BOTTLES DRAWN AEROBIC AND ANAEROBIC Performed at [facility], [address] [place], [place] [address]    Special Requests      Blood Culture adequate volume Performed at [facility], [address] [place], [place] [address]    Culture  Setup Time      GRAM POSITIVE COCCI IN BOTH AEROBIC AND ANAEROBIC BOTTLES Gram Stain Report Called to,Read Back By and Verified With: Brown,P @2133  by Matthews,B 7.19.22 [facility] Organism ID to follow Performed at [facility], [place] [address], [place], [place] [address]    Culture GRAM POSITIVE COCCI    Report Status PENDING   Blood Culture ID Panel (Reflexed)     Status: Abnormal   Collection Time: 06/21/21  8:51 AM  Result Value Ref Range   Enterococcus faecalis NOT DETECTED NOT DETECTED   Enterococcus Faecium NOT DETECTED NOT DETECTED   Listeria monocytogenes NOT DETECTED NOT DETECTED   Staphylococcus species NOT DETECTED NOT DETECTED   Staphylococcus aureus (BCID) NOT DETECTED NOT DETECTED   Staphylococcus epidermidis NOT DETECTED NOT DETECTED   Staphylococcus lugdunensis NOT DETECTED NOT DETECTED   Streptococcus species DETECTED (A) NOT DETECTED    Comment: Not Enterococcus species, Streptococcus agalactiae, Streptococcus pyogenes, or Streptococcus pneumoniae. CRITICAL RESULT CALLED TO, READ BACK BY AND VERIFIED WITH: P. BROWN RN, AT 0321 06/22/21 D. VANHOOK    Streptococcus agalactiae NOT DETECTED NOT DETECTED   Streptococcus pneumoniae NOT DETECTED NOT  DETECTED   Streptococcus pyogenes NOT DETECTED NOT DETECTED   A.calcoaceticus-baumannii NOT DETECTED NOT DETECTED   Bacteroides fragilis NOT DETECTED NOT DETECTED   Enterobacterales NOT DETECTED NOT DETECTED   Enterobacter cloacae complex NOT DETECTED NOT DETECTED   Escherichia coli NOT DETECTED NOT DETECTED   Klebsiella aerogenes NOT DETECTED NOT DETECTED   Klebsiella oxytoca NOT DETECTED NOT DETECTED   Klebsiella pneumoniae NOT DETECTED NOT DETECTED   Proteus species NOT DETECTED NOT DETECTED   Salmonella species NOT DETECTED NOT DETECTED   Serratia marcescens NOT DETECTED NOT DETECTED   Haemophilus influenzae NOT DETECTED NOT DETECTED   Neisseria meningitidis NOT DETECTED NOT DETECTED   Pseudomonas aeruginosa NOT DETECTED NOT DETECTED   Stenotrophomonas maltophilia NOT DETECTED NOT DETECTED   Candida albicans NOT DETECTED NOT DETECTED   Candida auris NOT DETECTED NOT DETECTED   Candida glabrata NOT DETECTED NOT DETECTED   Candida krusei NOT DETECTED NOT DETECTED   Candida parapsilosis NOT DETECTED NOT DETECTED   Candida tropicalis NOT DETECTED NOT DETECTED   Cryptococcus neoformans/gattii NOT DETECTED NOT DETECTED    Comment: Performed at [facility], [address], [place], [place] [address]  CBC     Status: Abnormal   Collection Time: 06/21/21  8:52 AM  Result Value Ref Range   WBC 5.8 4.0 - 10.5 K/uL   RBC 4.52 3.87 - 5.11 MIL/uL   Hemoglobin 14.3 12.0 - 15.0 g/dL   HCT 42.8 36.0 - 46.0 %   MCV 94.7 80.0 - 100.0 fL   MCH 31.6 26.0 - 34.0 pg   MCHC 33.4 30.0 - 36.0 g/dL   RDW 13.8 11.5 - 15.5 %   Platelets 112 (L) 150 - 400 K/uL    Comment: SPECIMEN CHECKED FOR CLOTS REPEATED TO  VERIFY PLATELET COUNT CONFIRMED BY SMEAR    nRBC 0.0 0.0 - 0.2 %    Comment: Performed at [facility], [address], [place], [place] [address]  Basic metabolic panel     Status: Abnormal   Collection Time: 06/21/21  8:52 AM  Result Value Ref Range   Sodium 135 135 - 145 mmol/L    Potassium 3.6 3.5 - 5.1 mmol/L   Chloride 106 98 - 111 mmol/L   CO2 22 22 - 32 mmol/L   Glucose, Bld 139 (H) 70 - 99 mg/dL    Comment: Glucose reference range applies only to samples taken after fasting for at least 8 hours.   BUN 13 6 - 20 mg/dL   Creatinine, Ser 0.87 0.44 - 1.00 mg/dL   Calcium 8.7 (L) 8.9 - 10.3 mg/dL   GFR, Estimated >60 >60 mL/min    Comment: (NOTE) Calculated using the CKD-EPI Creatinine Equation (2021)    Anion gap 7 5 - 15    Comment: Performed at [facility], [address] [place] [address], [place], [place] [address]  Resp Panel by RT-PCR (Flu A&B, Covid) Nasopharyngeal Swab     Status: None   Collection Time: 06/21/21  8:52 AM   Specimen: Nasopharyngeal Swab; Nasopharyngeal(NP) swabs in vial transport medium  Result Value Ref Range   SARS Coronavirus 2 by RT PCR NEGATIVE NEGATIVE    Comment: (NOTE) SARS-CoV-2 target nucleic acids are NOT DETECTED.  The SARS-CoV-2 RNA is generally detectable in upper respiratory specimens during the acute phase of infection. The lowest concentration of SARS-CoV-2 viral copies this assay can detect is 138 copies/mL. A negative result does not preclude SARS-Cov-2 infection and should not be used as the sole basis for treatment or other patient management decisions. A negative result may occur with  improper specimen collection/handling, submission of specimen other than nasopharyngeal swab, presence of viral mutation(s) within the areas targeted by this assay, and inadequate number of viral copies(<138 copies/mL). A negative result must be combined with clinical observations, patient history, and epidemiological information. The expected result is Negative.  Fact Sheet for Patients:  EntrepreneurPulse.com.au  Fact Sheet for Healthcare Providers:  IncredibleEmployment.be  This test is no t yet approved or cleared by the [place] FDA and  has been authorized for detection and/or diagnosis of  SARS-CoV-2 by FDA under an Emergency Use Authorization (EUA). This EUA will remain  in effect (meaning this test can be used) for the duration of the COVID-19 declaration under Section 564(b)(1) of the Act, 21 U.S.C.section 360bbb-3(b)(1), unless the authorization is terminated  or revoked sooner.       Influenza A by PCR NEGATIVE NEGATIVE   Influenza B by PCR NEGATIVE NEGATIVE    Comment: (NOTE) The Xpert Xpress SARS-CoV-2/FLU/RSV plus assay is intended as an aid in the diagnosis of influenza from Nasopharyngeal swab specimens and should not be used as a sole basis for treatment. Nasal washings and aspirates are unacceptable for Xpert Xpress SARS-CoV-2/FLU/RSV testing.  Fact Sheet for Patients: EntrepreneurPulse.com.au  Fact Sheet for Healthcare Providers: IncredibleEmployment.be  This test is not yet approved or cleared by the [place] FDA and has been authorized for detection and/or diagnosis of SARS-CoV-2 by FDA under an Emergency Use Authorization (EUA). This EUA will remain in effect (meaning this test can be used) for the duration of the COVID-19 declaration under Section 564(b)(1) of the Act, 21 U.S.C. section 360bbb-3(b)(1), unless the authorization is terminated or revoked.  Performed at [facility], [address],  [place], [place] [address]   Lactic acid, plasma     Status: None   Collection Time: 06/21/21  9:10 AM  Result Value Ref Range   Lactic Acid, Venous 0.9 0.5 - 1.9 mmol/L    Comment: Performed at [facility], [address] [place], [place] [address]  Magnesium     Status: None   Collection Time: 06/22/21  5:24 AM  Result Value Ref Range   Magnesium 1.9 1.7 - 2.4 mg/dL    Comment: Performed at [facility], [address], [place], [place] [address]  Basic metabolic panel     Status: Abnormal   Collection Time: 06/22/21  5:24 AM  Result Value Ref Range   Sodium 134 (L) 135 - 145 mmol/L   Potassium 3.7 3.5 -  5.1 mmol/L   Chloride 108 98 - 111 mmol/L   CO2 21 (L) 22 - 32 mmol/L   Glucose, Bld 119 (H) 70 - 99 mg/dL    Comment: Glucose reference range applies only to samples taken after fasting for at least 8 hours.   BUN 9 6 - 20 mg/dL   Creatinine, Ser 0.79 0.44 - 1.00 mg/dL   Calcium 8.1 (L) 8.9 - 10.3 mg/dL   GFR, Estimated >60 >60 mL/min    Comment: (NOTE) Calculated using the CKD-EPI Creatinine Equation (2021)    Anion gap 5 5 - 15    Comment: Performed at [facility], [address], [place], [place] [address]  CBC     Status: Abnormal   Collection Time: 06/22/21  5:24 AM  Result Value Ref Range   WBC 4.2 4.0 - 10.5 K/uL   RBC 3.83 (L) 3.87 - 5.11 MIL/uL   Hemoglobin 12.1 12.0 - 15.0 g/dL   HCT 36.9 36.0 - 46.0 %   MCV 96.3 80.0 - 100.0 fL   MCH 31.6 26.0 - 34.0 pg   MCHC 32.8 30.0 - 36.0 g/dL   RDW 14.0 11.5 - 15.5 %   Platelets 101 (L) 150 - 400 K/uL    Comment: SPECIMEN CHECKED FOR CLOTS Immature Platelet Fraction may be clinically indicated, consider ordering this additional test [id number] CONSISTENT WITH PREVIOUS RESULT    nRBC 0.0 0.0 - 0.2 %    Comment: Performed at [facility], [address], [place], [place] [address]   CXR reviewed- subclavian port in left  CT Head Wo Contrast  Result Date: 06/21/2021 CLINICAL DATA:  Headache. EXAM: CT HEAD WITHOUT CONTRAST TECHNIQUE: Contiguous axial images were obtained from the base of the skull through the vertex without intravenous contrast. COMPARISON:  None. FINDINGS: Brain: There is no evidence for acute hemorrhage, hydrocephalus, mass lesion, or abnormal extra-axial fluid collection. No definite CT evidence for acute infarction. Vascular: No hyperdense vessel or unexpected calcification. Skull: No evidence for fracture. No worrisome lytic or sclerotic lesion. Sinuses/Orbits: The visualized paranasal sinuses and mastoid air cells are clear. Visualized portions of the globes and intraorbital fat are unremarkable. Other: None.  IMPRESSION: Unremarkable study.  No acute intracranial abnormality. Electronically Signed   By: Misty Stanley M.D.   On: 06/21/2021 08:24   US Venous Img Upper Uni Left  Result Date: 06/21/2021 CLINICAL DATA:  Left upper extremity swelling for 3 days EXAM: LEFT UPPER EXTREMITY VENOUS DOPPLER ULTRASOUND TECHNIQUE: Gray-scale sonography with graded compression, as well as color Doppler and duplex ultrasound were performed to evaluate the upper extremity deep venous system from the level of the subclavian vein and including the jugular, axillary, basilic, radial, ulnar and  upper cephalic vein. Spectral Doppler was utilized to evaluate flow at rest and with distal augmentation maneuvers. COMPARISON:  None. FINDINGS: Contralateral Subclavian Vein: Respiratory phasicity is normal and symmetric with the symptomatic side. No evidence of thrombus. Normal compressibility. Internal Jugular Vein: No evidence of thrombus. Normal compressibility, respiratory phasicity and response to augmentation. Subclavian Vein: No evidence of thrombus. Normal compressibility, respiratory phasicity and response to augmentation. Axillary Vein: No evidence of thrombus. Normal compressibility, respiratory phasicity and response to augmentation. Cephalic Vein: No evidence of thrombus. Normal compressibility, respiratory phasicity and response to augmentation. Basilic Vein: No evidence of thrombus. Normal compressibility, respiratory phasicity and response to augmentation. Brachial Veins: No evidence of thrombus. Normal compressibility, respiratory phasicity and response to augmentation. Radial Veins: No evidence of thrombus. Normal compressibility, respiratory phasicity and response to augmentation. Ulnar Veins: No evidence of thrombus. Normal compressibility, respiratory phasicity and response to augmentation. IMPRESSION: No evidence of DVT within the left upper extremity. Electronically Signed   By: Jerilynn Mages.  Shick M.D.   On: 06/21/2021 10:15    DG Chest Port 1 View  Result Date: 06/22/2021 CLINICAL DATA:  Port-A-Cath verification. EXAM: PORTABLE CHEST 1 VIEW COMPARISON:  None. FINDINGS: Accessed left chest port in place. The tip is in the region of the upper SVC. No pneumothorax. Patient is mildly rotated. The heart is normal in size. Normal mediastinal contours. Minimal left basilar opacity felt to be a prominent epicardial fat pad. No confluent airspace disease. No pleural effusion. No pneumothorax. Surgical clips in the left axilla. No acute osseous abnormalities are seen. IMPRESSION: Accessed left chest port in place with tip in the region of the upper SVC. No pneumothorax. Electronically Signed   By: Keith Rake M.D.   On: 06/22/2021 18:17     Assessment & Plan:  Laree Garron is a 50 y.o. female with bacteremia and cellulitis who needs her port removed. RN asked to place right arm IV given the need for access and limited access. The risk of lymphedema for a short term IV versus worsening infection or injury from CVL was considered and IV in the right arm is more appropriate. Discussed this with the patient. RN placed IV and port deaccessed.  Port removed at the bedside. Consent obtained and discussed risk of bleeding, worsening infection, packing wound.   Procedure: Port removal Diagnosis: Cellulitis, port in place, bacteremia  Description: The left chest was prepped with betadine. Lidocaine was injected. Using a scalpel the prior incision was opened and carried down to the subcutaneous tissues with scissors. No purulence was noted. The port was removed in its entirety with sharp dissection. Pressure was held on the subclavian vein for 10 minutes. The cavity was left open and packed with saline dampened gauze and paper tape. The patient tolerated the procedure well.   Lidocaine 1% 15 mL used.   All questions were answered to the satisfaction of the patient and family.  Daily dressing changes starting tomorrow.  CXR to  confirm complete removal.    Virl Cagey 06/22/2021, 7:40 PM

## 2021-06-22 NOTE — Progress Notes (Signed)
PROGRESS NOTE    Sharon Phillips  TOI:[id number] DOB: 1970-12-09 DOA: 06/21/2021 PCP: Patient, [name]   Brief Narrative:   Sharon Phillips is a 50 y.o. female with medical history significant for breast cancer-currently off chemotherapy and in remission from last follow up, hypertension, dyslipidemia, and depression who presented to the ED with complaints of left upper arm redness and pain. She states that the pain has been intense and restricting her movement. Denies any nausea, vomiting, fever, neck stiffness. The redness has expanded throughout her abdomen, but she was mostly unaware of the spread. Patient was tachycardic   Of note she has a history of migraine and also presented with headache. Denies photophobia.  Workup in the ED showed normal WBC, platelet count 112,000. Left upper extremity US was performed and there was no sign of DVT. CT head was obtained to evaluate for CVA showed no acute findings. Concern was for hematogenous spread of cellulitis for which Unasyn was ordered by ED provider.   7/19 - She had complaint of lip edema, raising concern for angioedema and allergic reaction in setting of extensive cellulitis. She was on lisinopril so that was held. Serial exam showed stable and slight decrease in lip swelling with no involvement of tongue/airway. Patient did have a fever up to 103.3, tachycardia to 132 and BP 157/74 so metoprolol was given for rate control. Patient's fever was controlled with tylenol, fioricet and toradol.   7/20  AM Erythema spread throughout torso, and examination of the port shows like the infection was starting to spread from there. Patient reports that pain has improved and that she has more mobility with the arm.   Assessment & Plan:   Principal Problem:   Cellulitis Active Problems:   Bacteremia   Infection due to Port-A-Cath   Cellulitis of left upper extremity   Fever, unspecified   Hypertension   History of breast cancer   High  cholesterol   Immunocompromised (HCC)   Sepsis secondary to Streptococcus bacteremia  and cellulitis of trunk and left upper extremity - case discussed over phone with ID (Dr. Linus Salmons); appreciates recommendations -Currently nonpurulent, but quite extensive and painful -Left chest wall port with erythema radiating out from port and involves entire torso and down to left arm. No significant tenderness or fluctuant noted, no concern for abscess -Per infectious disease, IV Ancef until sensitivity comes back, then can target with IV penicillin x 14 days. No need for TEE at this time to evaluate for endocarditis.  - Repeat blood culture in 2-3 days - Consult general surgery (Dr. Constance Haw) for port removal ; will proceed with further recommendations.    Migraine headache - No acute complaint today -Continue home medications for pain management   Sinus tachycardia - RESOLVED  -Monitor on telemetry -Likely related to bacteremia -Resume home metoprolol   Thrombocytopenia - Patient had a large bruise on right thigh,  - Avoid heparin agents - Trend with AM CBC   History of hypertension/dyslipidemia -Continue home medications   History of depression -Continue home medications   DVT prophylaxis: SCDs Code Status: Full Family Communication: Spoke with son on phone 7/20, daughter at bedside Disposition Plan: home after infection is treated  Consultants:  General surgery (Dr. Constance Haw) Infectious disease (over the phone Dr. Linus Salmons)  Procedures:  Central venous port removal to be scheduled  Antimicrobials:  Antibiotics Given (last 72 hours)     Date/Time Action Medication Dose Rate   06/21/21 1114 New Bag/Given   Ampicillin-Sulbactam (UNASYN)  3 g in sodium chloride 0.9 % 100 mL IVPB 3 g 200 mL/hr   06/21/21 1632 New Bag/Given   ceFAZolin (ANCEF) IVPB 2g/100 mL premix 2 g 200 mL/hr   06/21/21 2221 New Bag/Given   ceFAZolin (ANCEF) IVPB 2g/100 mL premix 2 g 200 mL/hr   06/22/21 0539 New  Bag/Given   ceFAZolin (ANCEF) IVPB 2g/100 mL premix 2 g 200 mL/hr      Subjective: Patient was examined at bedside. States that she feels that the pain in left arm has improved and that she can move it now when she could not before. She does not report any new swelling. Overnight, patient became febrile T max 103, which has decreased to 99.8 with tylenol.   Patient also had a large bruise on right thigh; states that she fell and hit her thigh against the bath tub. Denies history of easy bruising/prolonged bleeding Objective: Vitals:   06/21/21 1745 06/21/21 1951 06/22/21 0432 06/22/21 0540  BP: 106/65 113/64 133/70   Pulse: (!) 111 (!) 110 (!) 110   Resp: 18 18 18    Temp: 100 F (37.8 C) (!) 101.3 F (38.5 C) (!) 102.6 F (39.2 C) 99.8 F (37.7 C)  TempSrc: Oral Oral Oral   SpO2: 96% 100% 100%   Weight:      Height:        Intake/Output Summary (Last 24 hours) at 06/22/2021 1239 Last data filed at 06/22/2021 0900 Gross per 24 hour  Intake 306.59 ml  Output --  Net 306.59 ml   Filed Weights   06/21/21 0717  Weight: 77.1 kg    Examination:  General exam: Appears calm, in no acute distress. Throat: slight lip swelling, no tongue swelling. Patient was talking with no difficulty Chest: A central venous port on left chest with intense erythema appears to radiate from the port throughout chest, left arm, and abdomen.  Respiratory system: Clear to auscultation. Respiratory effort normal. Cardiovascular system: S1 & S2 heard, RRR. No JVD, murmurs, rubs, gallops or clicks. No pedal edema. Gastrointestinal system: Abdomen is nondistended, soft and nontender. No organomegaly or masses felt. Normal bowel sounds heard. Central nervous system: Alert and awake. Conversant, no focal deficit observed.  Extremities: Symmetric 5 x 5 power. Skin: worsened erythema that now extends up to clavicles bilaterally from lower abdomen. No involvement of lower extremities and right arm. Large purple  bruise on R thigh Psychiatry: Judgement and insight appear normal. Mood & affect in full range  Data Reviewed: I have personally reviewed following labs and imaging studies  CBC: Recent Labs  Lab 06/21/21 0852 06/22/21 0524  WBC 5.8 4.2  HGB 14.3 12.1  HCT 42.8 36.9  MCV 94.7 96.3  PLT 112* 751*   Basic Metabolic Panel: Recent Labs  Lab 06/21/21 0852 06/22/21 0524  NA 135 134*  K 3.6 3.7  CL 106 108  CO2 22 21*  GLUCOSE 139* 119*  BUN 13 9  CREATININE 0.87 0.79  CALCIUM 8.7* 8.1*  MG  --  1.9   GFR: Estimated Creatinine Clearance: 86.5 mL/min (by C-G formula based on SCr of 0.79 mg/dL). Liver Function Tests: No results for input(s): AST, ALT, ALKPHOS, BILITOT, PROT, ALBUMIN in the last 168 hours. No results for input(s): LIPASE, AMYLASE in the last 168 hours. No results for input(s): AMMONIA in the last 168 hours. Coagulation Profile: No results for input(s): INR, PROTIME in the last 168 hours. Cardiac Enzymes: No results for input(s): CKTOTAL, CKMB, CKMBINDEX, TROPONINI in the last  168 hours. BNP (last 3 results) No results for input(s): PROBNP in the last 8760 hours. HbA1C: No results for input(s): HGBA1C in the last 72 hours. CBG: No results for input(s): GLUCAP in the last 168 hours. Lipid Profile: No results for input(s): CHOL, HDL, LDLCALC, TRIG, CHOLHDL, LDLDIRECT in the last 72 hours. Thyroid Function Tests: No results for input(s): TSH, T4TOTAL, FREET4, T3FREE, THYROIDAB in the last 72 hours. Anemia Panel: No results for input(s): VITAMINB12, FOLATE, FERRITIN, TIBC, IRON, RETICCTPCT in the last 72 hours. Urine analysis: No results found for: COLORURINE, APPEARANCEUR, [place], [place], GLUCOSEU, HGBUR, [place], KETONESUR, [place], UROBILINOGEN, NITRITE, LEUKOCYTESUR Sepsis Labs: @LABRCNTIP (procalcitonin:4,lacticidven:4)  ) Recent Results (from the past 240 hour(s))  Blood culture (routine x 2)     Status: None (Preliminary result)    Collection Time: 06/21/21  7:33 AM   Specimen: Chest; Blood  Result Value Ref Range Status   Specimen Description CHEST BOTTLES DRAWN AEROBIC AND ANAEROBIC  Final   Special Requests Blood Culture adequate volume  Final   Culture  Setup Time   Final    GRAM POSITIVE COCCI IN BOTH AEROBIC AND ANAEROBIC BOTTLES Gram Stain Report Called to,Read Back By and Verified With: brown,p @ 2133 by matthews,b 7.19.22 [facility]    Culture   Final    NO GROWTH < 24 HOURS Performed at [facility], [address] [place], [place] [address]    Report Status PENDING  Incomplete  Blood culture (routine x 2)     Status: None (Preliminary result)   Collection Time: 06/21/21  8:51 AM   Specimen: Chest; Blood  Result Value Ref Range Status   Specimen Description   Final    CHEST BOTTLES DRAWN AEROBIC AND ANAEROBIC Performed at [facility], [address], [place], [place] [address]    Special Requests   Final    Blood Culture adequate volume Performed at [facility], [address], [place], [place] [address]    Culture  Setup Time   Final    GRAM POSITIVE COCCI IN BOTH AEROBIC AND ANAEROBIC BOTTLES Gram Stain Report Called to,Read Back By and Verified With: Brown,P @2133  by Matthews,B 7.19.22 [facility] Organism ID to follow Performed at [facility], [place] [address], [place], [place] [address]    Culture GRAM POSITIVE COCCI  Final   Report Status PENDING  Incomplete  Blood Culture ID Panel (Reflexed)     Status: Abnormal   Collection Time: 06/21/21  8:51 AM  Result Value Ref Range Status   Enterococcus faecalis NOT DETECTED NOT DETECTED Final   Enterococcus Faecium NOT DETECTED NOT DETECTED Final   Listeria monocytogenes NOT DETECTED NOT DETECTED Final   Staphylococcus species NOT DETECTED NOT DETECTED Final   Staphylococcus aureus (BCID) NOT DETECTED NOT DETECTED Final   Staphylococcus epidermidis NOT DETECTED NOT DETECTED Final   Staphylococcus lugdunensis NOT DETECTED  NOT DETECTED Final   Streptococcus species DETECTED (A) NOT DETECTED Final    Comment: Not Enterococcus species, Streptococcus agalactiae, Streptococcus pyogenes, or Streptococcus pneumoniae. CRITICAL RESULT CALLED TO, READ BACK BY AND VERIFIED WITH: P. BROWN RN, AT 0321 06/22/21 D. VANHOOK    Streptococcus agalactiae NOT DETECTED NOT DETECTED Final   Streptococcus pneumoniae NOT DETECTED NOT DETECTED Final   Streptococcus pyogenes NOT DETECTED NOT DETECTED Final   A.calcoaceticus-baumannii NOT DETECTED NOT DETECTED Final   Bacteroides fragilis NOT DETECTED NOT DETECTED Final   Enterobacterales NOT DETECTED NOT DETECTED Final   Enterobacter cloacae complex NOT DETECTED  NOT DETECTED Final   Escherichia coli NOT DETECTED NOT DETECTED Final   Klebsiella aerogenes NOT DETECTED NOT DETECTED Final   Klebsiella oxytoca NOT DETECTED NOT DETECTED Final   Klebsiella pneumoniae NOT DETECTED NOT DETECTED Final   Proteus species NOT DETECTED NOT DETECTED Final   Salmonella species NOT DETECTED NOT DETECTED Final   Serratia marcescens NOT DETECTED NOT DETECTED Final   Haemophilus influenzae NOT DETECTED NOT DETECTED Final   Neisseria meningitidis NOT DETECTED NOT DETECTED Final   Pseudomonas aeruginosa NOT DETECTED NOT DETECTED Final   Stenotrophomonas maltophilia NOT DETECTED NOT DETECTED Final   Candida albicans NOT DETECTED NOT DETECTED Final   Candida auris NOT DETECTED NOT DETECTED Final   Candida glabrata NOT DETECTED NOT DETECTED Final   Candida krusei NOT DETECTED NOT DETECTED Final   Candida parapsilosis NOT DETECTED NOT DETECTED Final   Candida tropicalis NOT DETECTED NOT DETECTED Final   Cryptococcus neoformans/gattii NOT DETECTED NOT DETECTED Final    Comment: Performed at [facility], [place] [address] [place], [place] [address]  Resp Panel by RT-PCR (Flu A&B, Covid) Nasopharyngeal Swab     Status: None   Collection Time: 06/21/21  8:52 AM   Specimen: Nasopharyngeal Swab;  Nasopharyngeal(NP) swabs in vial transport medium  Result Value Ref Range Status   SARS Coronavirus 2 by RT PCR NEGATIVE NEGATIVE Final    Comment: (NOTE) SARS-CoV-2 target nucleic acids are NOT DETECTED.  The SARS-CoV-2 RNA is generally detectable in upper respiratory specimens during the acute phase of infection. The lowest concentration of SARS-CoV-2 viral copies this assay can detect is 138 copies/mL. A negative result does not preclude SARS-Cov-2 infection and should not be used as the sole basis for treatment or other patient management decisions. A negative result may occur with  improper specimen collection/handling, submission of specimen other than nasopharyngeal swab, presence of viral mutation(s) within the areas targeted by this assay, and inadequate number of viral copies(<138 copies/mL). A negative result must be combined with clinical observations, patient history, and epidemiological information. The expected result is Negative.  Fact Sheet for Patients:  EntrepreneurPulse.com.au  Fact Sheet for Healthcare Providers:  IncredibleEmployment.be  This test is no t yet approved or cleared by the [place] FDA and  has been authorized for detection and/or diagnosis of SARS-CoV-2 by FDA under an Emergency Use Authorization (EUA). This EUA will remain  in effect (meaning this test can be used) for the duration of the COVID-19 declaration under Section 564(b)(1) of the Act, 21 U.S.C.section 360bbb-3(b)(1), unless the authorization is terminated  or revoked sooner.       Influenza A by PCR NEGATIVE NEGATIVE Final   Influenza B by PCR NEGATIVE NEGATIVE Final    Comment: (NOTE) The Xpert Xpress SARS-CoV-2/FLU/RSV plus assay is intended as an aid in the diagnosis of influenza from Nasopharyngeal swab specimens and should not be used as a sole basis for treatment. Nasal washings and aspirates are unacceptable for Xpert Xpress  SARS-CoV-2/FLU/RSV testing.  Fact Sheet for Patients: EntrepreneurPulse.com.au  Fact Sheet for Healthcare Providers: IncredibleEmployment.be  This test is not yet approved or cleared by the [place] FDA and has been authorized for detection and/or diagnosis of SARS-CoV-2 by FDA under an Emergency Use Authorization (EUA). This EUA will remain in effect (meaning this test can be used) for the duration of the COVID-19 declaration under Section 564(b)(1) of the Act, 21 U.S.C. section 360bbb-3(b)(1), unless the authorization is terminated or revoked.  Performed at [facility],  [address] [place], [place] [address]     Radiology Studies: CT Head Wo Contrast  Result Date: 06/21/2021 CLINICAL DATA:  Headache. EXAM: CT HEAD WITHOUT CONTRAST TECHNIQUE: Contiguous axial images were obtained from the base of the skull through the vertex without intravenous contrast. COMPARISON:  None. FINDINGS: Brain: There is no evidence for acute hemorrhage, hydrocephalus, mass lesion, or abnormal extra-axial fluid collection. No definite CT evidence for acute infarction. Vascular: No hyperdense vessel or unexpected calcification. Skull: No evidence for fracture. No worrisome lytic or sclerotic lesion. Sinuses/Orbits: The visualized paranasal sinuses and mastoid air cells are clear. Visualized portions of the globes and intraorbital fat are unremarkable. Other: None. IMPRESSION: Unremarkable study.  No acute intracranial abnormality. Electronically Signed   By: Misty Stanley M.D.   On: 06/21/2021 08:24   US Venous Img Upper Uni Left  Result Date: 06/21/2021 CLINICAL DATA:  Left upper extremity swelling for 3 days EXAM: LEFT UPPER EXTREMITY VENOUS DOPPLER ULTRASOUND TECHNIQUE: Gray-scale sonography with graded compression, as well as color Doppler and duplex ultrasound were performed to evaluate the upper extremity deep venous system from the level of the subclavian vein  and including the jugular, axillary, basilic, radial, ulnar and upper cephalic vein. Spectral Doppler was utilized to evaluate flow at rest and with distal augmentation maneuvers. COMPARISON:  None. FINDINGS: Contralateral Subclavian Vein: Respiratory phasicity is normal and symmetric with the symptomatic side. No evidence of thrombus. Normal compressibility. Internal Jugular Vein: No evidence of thrombus. Normal compressibility, respiratory phasicity and response to augmentation. Subclavian Vein: No evidence of thrombus. Normal compressibility, respiratory phasicity and response to augmentation. Axillary Vein: No evidence of thrombus. Normal compressibility, respiratory phasicity and response to augmentation. Cephalic Vein: No evidence of thrombus. Normal compressibility, respiratory phasicity and response to augmentation. Basilic Vein: No evidence of thrombus. Normal compressibility, respiratory phasicity and response to augmentation. Brachial Veins: No evidence of thrombus. Normal compressibility, respiratory phasicity and response to augmentation. Radial Veins: No evidence of thrombus. Normal compressibility, respiratory phasicity and response to augmentation. Ulnar Veins: No evidence of thrombus. Normal compressibility, respiratory phasicity and response to augmentation. IMPRESSION: No evidence of DVT within the left upper extremity. Electronically Signed   By: Jerilynn Mages.  Shick M.D.   On: 06/21/2021 10:15    Scheduled Meds:  exemestane  25 mg Oral Daily   gabapentin  300 mg Oral BID   loratadine  10 mg Oral Daily   metoprolol succinate  25 mg Oral Daily   rosuvastatin  5 mg Oral QHS   sertraline  50 mg Oral Daily   topiramate  25 mg Oral BID   venlafaxine XR  150 mg Oral Daily   Continuous Infusions:  sodium chloride 75 mL/hr at 06/22/21 1031    ceFAZolin (ANCEF) IV 2 g (06/22/21 0539)     LOS: 1 day    Time spent: 30 minutes  Fredrik Rigger, Medical student [facility] Pager [phone]  [phone]  If 7PM-7AM, please contact night-coverage [email or web address] Password [facility] 06/22/2021, 12:39 PM   ATTENDING NOTE  Patient seen and examined with Fredrik Rigger, Medical student. In addition to supervising the encounter, I played a key role in the decision making process as well as reviewed key findings.  Port appears to be infection with a severe cellulitis picture, discussed with ID and discussed with Dr. Thornton Papas, he recommended consulting surgery for port removal otherwise would need IR in [place] for removal.  We will ask for surgery to evaluate.  See orders.    Gerlene Fee,  MD  How to contact the [facility] Attending or Consulting provider [place] or covering provider during after hours [place], for this patient?  Check the care team in [facility] and look for a) attending/consulting [facility] provider listed and b) the [facility] team listed Log into [email or web address] and use [place]'s universal password to access. If you do not have the password, please contact the hospital operator. Locate the [facility] provider you are looking for under [facility] and page to a number that you can be directly reached. If you still have difficulty reaching the provider, please page the [facility] (Director on Call) for the Hospitalists listed on amion for assistance.

## 2021-06-23 LAB — CBC
HCT: 33.7 % — ABNORMAL LOW (ref 36.0–46.0)
Hemoglobin: 11.4 g/dL — ABNORMAL LOW (ref 12.0–15.0)
MCH: 32.1 pg (ref 26.0–34.0)
MCHC: 33.8 g/dL (ref 30.0–36.0)
MCV: 94.9 fL (ref 80.0–100.0)
Platelets: 100 10*3/uL — ABNORMAL LOW (ref 150–400)
RBC: 3.55 MIL/uL — ABNORMAL LOW (ref 3.87–5.11)
RDW: 13.9 % (ref 11.5–15.5)
WBC: 4.4 10*3/uL (ref 4.0–10.5)
nRBC: 0 % (ref 0.0–0.2)

## 2021-06-23 LAB — BASIC METABOLIC PANEL
Anion gap: 6 (ref 5–15)
BUN: 9 mg/dL (ref 6–20)
CO2: 20 mmol/L — ABNORMAL LOW (ref 22–32)
Calcium: 8 mg/dL — ABNORMAL LOW (ref 8.9–10.3)
Chloride: 109 mmol/L (ref 98–111)
Creatinine, Ser: 0.74 mg/dL (ref 0.44–1.00)
GFR, Estimated: >60 mL/min (ref >60)
Glucose, Bld: 111 mg/dL — ABNORMAL HIGH (ref 70–99)
Potassium: 3.3 mmol/L — ABNORMAL LOW (ref 3.5–5.1)
Sodium: 135 mmol/L (ref 135–145)

## 2021-06-23 LAB — MAGNESIUM: Magnesium: 1.9 mg/dL (ref 1.7–2.4)

## 2021-06-23 MED ORDER — SODIUM CHLORIDE 0.9 % IV SOLN
2.0000 g | INTRAVENOUS | Status: DC
  Administered 2021-06-24 – 2021-06-27 (×4): 2 g via INTRAVENOUS
  Filled 2021-06-23 (×4): qty 20

## 2021-06-23 MED ORDER — SODIUM CHLORIDE 0.9 % IV BOLUS
500.0000 mL | Freq: Once | INTRAVENOUS | Status: AC
  Administered 2021-06-23: 500 mL via INTRAVENOUS

## 2021-06-23 MED ORDER — POTASSIUM CHLORIDE CRYS ER 20 MEQ PO TBCR
40.0000 meq | EXTENDED_RELEASE_TABLET | Freq: Once | ORAL | Status: AC
  Administered 2021-06-23: 40 meq via ORAL
  Filled 2021-06-23: qty 2

## 2021-06-23 NOTE — Progress Notes (Signed)
   06/23/21 0141  Vitals  Temp (!) 102.9 F (39.4 C)  Temp Source Oral  BP 132/66  MAP (mmHg) 83  BP Location Left Leg  BP Method Automatic  Patient Position (if appropriate) Lying  Pulse Rate (!) 117  Pulse Rate Source Monitor  Resp 18  MEWS COLOR  MEWS Score Color Red  Oxygen Therapy  SpO2 99 %  MEWS Score  MEWS Temp 2  MEWS Systolic 0  MEWS Pulse 2  MEWS RR 0  MEWS LOC 0  MEWS Score 4  MD notified due to MEWS score of red. Patient reports feeling well. Tylenol given, will continue to monitor.

## 2021-06-23 NOTE — Progress Notes (Addendum)
I was present with the medical student for this service. I personally verified the history of present illness, performed the physical exam, and made the plan for this encounter. I have verified the medical student's documentation and made modifications where appropriately. I have personally documented in my own words a brief history, physical, and plan below.      Packing changed. Bacteremia and fevers.   Daily changes by RN.  Curlene Labrum, MD [facility] [place], [place] [address] [phone] (office)    [facility] Progress Note     Subjective: Sharon Phillips is a 50 y.o. female with with a history of breast cancer, bilateral mastectomy with axillary node dissection, and hysterectomy who has been hospitalized for cellulitis. She has bacteremia, but she believes her symptoms are improving. She had her port removed yesterday in the evening without any complications. Overnight, she did have a fever (TMAX - 102.9), but she still believes she has felt much better since the port was taken out.   The area where the port was removed was unpacked and repacked this morning, which caused moderate pain and distress. The area around the port is still very red, and her skin is tender, but she says it has become less red since she noticed the rash on Monday. She believes more of her skin has returned to its normal color since symptom onset, but there is still apparent erythema throughout the left arm, bilateral chest wall, and back.  Objective: Vital signs in last 24 hours: Temp:  [98 F (36.7 C)-102.9 F (39.4 C)] 98.1 F (36.7 C) (07/21 1256) Pulse Rate:  [96-117] 99 (07/21 1256) Resp:  [18-19] 18 (07/21 1256) BP: (107-138)/(59-77) 115/75 (07/21 1256) SpO2:  [98 %-100 %] 98 % (07/21 1256) Last BM Date: 06/21/21  Intake/Output from previous day: 07/20 0701 - 07/21 0700 In: 1267.1 [P.O.:960; I.V.:307.1] Out: -   Intake/Output this shift: No intake/output data recorded.  General appearance: alert, cooperative, and no distress Head: Normocephalic, without obvious abnormality, atraumatic Resp: No respiratory distress Chest wall: Chest wall is red and tender Cardio: regular rate and rhythm Skin: diffuse erythema and swelling of left arm, bilateral chest wall, back Neurologic: Alert and oriented X 3, normal strength and tone. Normal symmetric reflexes. Normal coordination and gait Psychiatric: Mood, affect, and behavior are normal  Lab Results:  Recent Labs    06/22/21 0524 06/23/21 0456  WBC 4.2 4.4  HGB 12.1 11.4*  HCT 36.9 33.7*  PLT 101* 100*   BMET Recent Labs    06/22/21 0524 06/23/21 0456  NA 134* 135  K 3.7 3.3*  CL 108 109  CO2 21* 20*  GLUCOSE 119* 111*  BUN 9 9  CREATININE 0.79 0.74  CALCIUM 8.1* 8.0*   PT/INR No results for input(s): LABPROT, INR in the last 72 hours.  Studies/Results: DG Chest Port 1 View  Result Date: 06/22/2021 CLINICAL DATA:  Port-A-Cath verification. EXAM: PORTABLE CHEST 1 VIEW COMPARISON:  None. FINDINGS: Accessed left chest port in place. The tip is in the region of the upper SVC. No pneumothorax. Patient is mildly rotated. The heart is normal in size. Normal mediastinal contours. Minimal left basilar opacity felt to be a prominent epicardial fat pad. No confluent airspace disease. No pleural effusion. No pneumothorax. Surgical clips in the left axilla. No acute osseous abnormalities are seen. IMPRESSION: Accessed left chest port in place with tip in the region of the upper SVC. No pneumothorax. Electronically Signed  By: Keith Rake M.D.   On: 06/22/2021 18:17   DG Chest Port 1V same Day  Result Date: 06/22/2021 CLINICAL DATA:  Port-A-Cath removed EXAM: PORTABLE CHEST 1 VIEW COMPARISON:  06/22/2021 FINDINGS: Removal of left-sided central venous port. Clips in the left axilla. No focal opacity or pleural effusion. Normal heart size. No  pneumothorax IMPRESSION: No active disease.  Removal of left-sided central venous port Electronically Signed   By: Donavan Foil M.D.   On: 06/22/2021 21:48    Anti-infectives: Anti-infectives (From admission, onward)    Start     Dose/Rate Route Frequency Ordered Stop   06/21/21 1400  ceFAZolin (ANCEF) IVPB 1 g/50 mL premix  Status:  Discontinued        1 g 100 mL/hr over 30 Minutes Intravenous Every 8 hours 06/21/21 1245 06/21/21 1251   06/21/21 1400  ceFAZolin (ANCEF) IVPB 2g/100 mL premix        2 g 200 mL/hr over 30 Minutes Intravenous Every 8 hours 06/21/21 1251     06/21/21 1045  Ampicillin-Sulbactam (UNASYN) 3 g in sodium chloride 0.9 % 100 mL IVPB  Status:  Discontinued        3 g 200 mL/hr over 30 Minutes Intravenous Every 6 hours 06/21/21 1033 06/21/21 1245       Assessment/Plan: s/p     LOS: 2 days   Sharon Phillips is a 50 y.o. female with with a history of breast cancer, bilateral mastectomy with axillary node dissection, and hysterectomy who has been hospitalized for cellulitis.   Her symptoms are improving, but she will remain hospitalized for close monitoring. The port was removed yesterday, so daily dressing changes will need to continue. Her temperature and skin will need continued monitoring, and we will continue Ancef for the cellulitis.  Danie Chandler 06/23/2021

## 2021-06-23 NOTE — Care Management Important Message (Signed)
Important Message  Patient Details  Name: Sharon Phillips MRN: [id number] Date of Birth: 1971-11-29   Medicare Important Message Given:  Yes     Tommy Medal 06/23/2021, 4:24 PM

## 2021-06-23 NOTE — Progress Notes (Signed)
PROGRESS NOTE    Sharon Phillips  JXB:[id number] DOB: [birth year] DOA: 06/21/2021 PCP: Patient, [name]    Brief Narrative:   Sharon Phillips is a 50 y.o. female with medical history significant for breast cancer-currently off chemotherapy and in remission from last follow up, hypertension, dyslipidemia, and depression who presented to the ED with complaints of left upper arm redness and pain. She states that the pain has been intense and restricting her movement. Denies any nausea, vomiting, fever, neck stiffness. The redness has expanded throughout her abdomen, but she was mostly unaware of the spread. Patient was tachycardic.   Of note she has a history of migraine and also presented with headache. Denies photophobia.   Workup in the ED showed normal WBC, platelet count 112,000. Left upper extremity US was performed and there was no sign of DVT. CT head was obtained to evaluate for CVA showed no acute findings. Concern was for hematogenous spread of cellulitis for which Unasyn was ordered by ED provider.   7/19 - She had complaint of lip edema, raising concern for angioedema and allergic reaction in setting of extensive cellulitis. She was on lisinopril so that was held. Serial exam showed stable and slight decrease in lip swelling with no involvement of tongue/airway. Patient did have a fever up to 103.3, tachycardia to 132 and BP 157/74 so metoprolol was given for rate control. Patient's fever was controlled with tylenol, fioricet and toradol.   7/20  AM Erythema spread throughout torso, and examination of the port shows like the infection was starting to spread from there. Patient reports that pain has improved and that she has more mobility with the arm.  Dr. Constance Haw removed the port 7/20 evening with no complications  1/30 - patient's cellulitis and erythema have improved with some clearing of erythema on arms. Blood culture from 7/19 grew Group G streptococcus. Will transition  antibiotics to ceftriaxone 2g IV q24h for easier dosing compared to mainstay treatment penicillin. Patient no longer has headache.   Assessment & Plan:   Principal Problem:   Cellulitis Active Problems:   Cellulitis of left upper extremity   Bacteremia   Fever, unspecified   Infection due to Port-A-Cath   Hypertension   History of breast cancer   High cholesterol   Immunocompromised (HCC)  Sepsis secondary to Streptococcus bacteremia  and cellulitis of trunk and left upper extremity - case discussed over phone with ID (Dr. Linus Salmons); appreciates recommendations -Currently nonpurulent, but quite extensive and painful -Left chest wall port with erythema radiating out from port and involves entire torso and down to left arm. No significant tenderness or fluctuant noted, no concern for abscess - Patient still has fever T max 102.9 overnight; broke with antipyretics.  -Per infectious disease, IV Ancef until sensitivity comes back, then can target with IV penicillin x 14 days. No need for TEE at this time to evaluate for endocarditis. - Port removed by Dr. Constance Haw (7/20). Dressing in placed - Repeat blood culture today 7/21 - Will transition to rocephin 2g q24h as 3rd gen cephalosporin is a good substitute and will provide easier dosing (once vs four times a day)   Migraine headache - No acute complaint today -Continue home medications for pain management   Sinus tachycardia -Monitor on telemetry -Likely related to bacteremia -Resumed home metoprolol   Thrombocytopenia - Patient had a large bruise on right thigh,  - Avoid heparin agents - Trend with AM CBC   History of hypertension/dyslipidemia -Continue home medications  History of depression -Continue home medications     DVT prophylaxis: SCDs Code Status: Full Family Communication: Spoke with son on phone 7/21  daughter at bedside Disposition Plan: home after infection is treated   Consultants:  General surgery (Dr.  Constance Haw) Infectious disease (over the phone Dr. Linus Salmons)   Procedures:  Central venous port removal by Dr. Constance Haw 06/22/21    Antimicrobials:  Antibiotics Given (last 72 hours)     Date/Time Action Medication Dose Rate   06/21/21 1114 New Bag/Given   Ampicillin-Sulbactam (UNASYN) 3 g in sodium chloride 0.9 % 100 mL IVPB 3 g 200 mL/hr   06/21/21 1632 New Bag/Given   ceFAZolin (ANCEF) IVPB 2g/100 mL premix 2 g 200 mL/hr   06/21/21 2221 New Bag/Given   ceFAZolin (ANCEF) IVPB 2g/100 mL premix 2 g 200 mL/hr   06/22/21 0539 New Bag/Given   ceFAZolin (ANCEF) IVPB 2g/100 mL premix 2 g 200 mL/hr   06/22/21 1420 New Bag/Given   ceFAZolin (ANCEF) IVPB 2g/100 mL premix 2 g 200 mL/hr   06/22/21 2102 New Bag/Given   ceFAZolin (ANCEF) IVPB 2g/100 mL premix 2 g 200 mL/hr   06/23/21 0554 New Bag/Given   ceFAZolin (ANCEF) IVPB 2g/100 mL premix 2 g 200 mL/hr      Subjective: Patient was examined at bedside. No acute complaints overnight. No pain. Patient had port removed by Dr. Constance Haw with no complications/excess bleeding . New IV access placed on right arm. Has been having Bms and UOP.   Objective: Vitals:   06/23/21 0334 06/23/21 0341 06/23/21 0441 06/23/21 0928  BP: 107/63 107/63 (!) 117/59 136/68  Pulse: 100 100 97 (!) 111  Resp: 19  19 18   Temp: 98.8 F (37.1 C) 98.8 F (37.1 C) 98.4 F (36.9 C)   TempSrc: Oral Oral Oral   SpO2: 98% 99% 98% 100%  Weight:      Height:        Intake/Output Summary (Last 24 hours) at 06/23/2021 1108 Last data filed at 06/22/2021 1835 Gross per 24 hour  Intake 1027.08 ml  Output --  Net 1027.08 ml   Filed Weights   06/21/21 0717  Weight: 77.1 kg   Examination:  General exam: Appears calm and comfortable  Respiratory system: Clear to auscultation. Respiratory effort normal. Cardiovascular system: S1 & S2 heard. No JVD, murmurs, rubs, gallops or clicks. No pedal edema. Gastrointestinal system: Abdomen is nondistended, soft and nontender. No  organomegaly or masses felt. Normal bowel sounds heard. Central nervous system: Alert and oriented. No focal neurological deficits. Extremities: Symmetric 5 x 5 power. Skin: Port site looks good, no drainage seen, erythematous rash persists on trunk and left upper extremity slighly improved, hot to touch.   Psychiatry: Judgement and insight appear normal. Mood & affect appropriate.   Data Reviewed: I have personally reviewed following labs and imaging studies  CBC: Recent Labs  Lab 06/21/21 0852 06/22/21 0524 06/23/21 0456  WBC 5.8 4.2 4.4  HGB 14.3 12.1 11.4*  HCT 42.8 36.9 33.7*  MCV 94.7 96.3 94.9  PLT 112* 101* 481*   Basic Metabolic Panel: Recent Labs  Lab 06/21/21 0852 06/22/21 0524 06/23/21 0456  NA 135 134* 135  K 3.6 3.7 3.3*  CL 106 108 109  CO2 22 21* 20*  GLUCOSE 139* 119* 111*  BUN 13 9 9   CREATININE 0.87 0.79 0.74  CALCIUM 8.7* 8.1* 8.0*  MG  --  1.9 1.9   GFR: Estimated Creatinine Clearance: 86.5 mL/min (by C-G formula based  on SCr of 0.74 mg/dL). Liver Function Tests: No results for input(s): AST, ALT, ALKPHOS, BILITOT, PROT, ALBUMIN in the last 168 hours. No results for input(s): LIPASE, AMYLASE in the last 168 hours. No results for input(s): AMMONIA in the last 168 hours. Coagulation Profile: No results for input(s): INR, PROTIME in the last 168 hours. Cardiac Enzymes: No results for input(s): CKTOTAL, CKMB, CKMBINDEX, TROPONINI in the last 168 hours. BNP (last 3 results) No results for input(s): PROBNP in the last 8760 hours. HbA1C: No results for input(s): HGBA1C in the last 72 hours. CBG: No results for input(s): GLUCAP in the last 168 hours. Lipid Profile: No results for input(s): CHOL, HDL, LDLCALC, TRIG, CHOLHDL, LDLDIRECT in the last 72 hours. Thyroid Function Tests: No results for input(s): TSH, T4TOTAL, FREET4, T3FREE, THYROIDAB in the last 72 hours. Anemia Panel: No results for input(s): VITAMINB12, FOLATE, FERRITIN, TIBC, IRON,  RETICCTPCT in the last 72 hours. Urine analysis: No results found for: COLORURINE, APPEARANCEUR, LABSPEC, [place], GLUCOSEU, HGBUR, [place], [place], [place], [place], NITRITE, LEUKOCYTESUR  Recent Results (from the past 240 hour(s))  Blood culture (routine x 2)     Status: Abnormal (Preliminary result)   Collection Time: 06/21/21  7:33 AM   Specimen: Chest; Blood  Result Value Ref Range Status   Specimen Description   Final    CHEST BOTTLES DRAWN AEROBIC AND ANAEROBIC Performed at [facility], [address] [place], [place] [address]    Special Requests   Final    Blood Culture adequate volume Performed at [facility], [address], [place], [place] [address]    Culture  Setup Time   Final    GRAM POSITIVE COCCI IN BOTH AEROBIC AND ANAEROBIC BOTTLES Gram Stain Report Called to,Read Back By and Verified With: brown,p @ 2133 by matthews,b 7.19.22 [facility] Performed at [facility], [address] [place], [place] [address]    Culture STREPTOCOCCUS GROUP G (A)  Final   Report Status PENDING  Incomplete  Blood culture (routine x 2)     Status: Abnormal (Preliminary result)   Collection Time: 06/21/21  8:51 AM   Specimen: Chest; Blood  Result Value Ref Range Status   Specimen Description   Final    CHEST BOTTLES DRAWN AEROBIC AND ANAEROBIC Performed at [facility], [address] [place], [place] [address]    Special Requests   Final    Blood Culture adequate volume Performed at [facility], [address], [place], [place] [address]    Culture  Setup Time   Final    GRAM POSITIVE COCCI IN BOTH AEROBIC AND ANAEROBIC BOTTLES Gram Stain Report Called to,Read Back By and Verified With: Brown,P @2133  by Matthews,B 7.19.22 [facility]    Culture (A)  Final    STREPTOCOCCUS GROUP G SUSCEPTIBILITIES TO FOLLOW Performed at [facility], [place] [address], [place], [place] [address]    Report Status PENDING  Incomplete  Blood Culture ID Panel  (Reflexed)     Status: Abnormal   Collection Time: 06/21/21  8:51 AM  Result Value Ref Range Status   Enterococcus faecalis NOT DETECTED NOT DETECTED Final   Enterococcus Faecium NOT DETECTED NOT DETECTED Final   Listeria monocytogenes NOT DETECTED NOT DETECTED Final   Staphylococcus species NOT DETECTED NOT DETECTED Final   Staphylococcus aureus (BCID) NOT DETECTED NOT DETECTED Final   Staphylococcus epidermidis NOT DETECTED NOT DETECTED Final   Staphylococcus lugdunensis NOT DETECTED NOT DETECTED Final   Streptococcus species DETECTED (  A) NOT DETECTED Final    Comment: Not Enterococcus species, Streptococcus agalactiae, Streptococcus pyogenes, or Streptococcus pneumoniae. CRITICAL RESULT CALLED TO, READ BACK BY AND VERIFIED WITH: P. BROWN RN, AT 0321 06/22/21 D. VANHOOK    Streptococcus agalactiae NOT DETECTED NOT DETECTED Final   Streptococcus pneumoniae NOT DETECTED NOT DETECTED Final   Streptococcus pyogenes NOT DETECTED NOT DETECTED Final   A.calcoaceticus-baumannii NOT DETECTED NOT DETECTED Final   Bacteroides fragilis NOT DETECTED NOT DETECTED Final   Enterobacterales NOT DETECTED NOT DETECTED Final   Enterobacter cloacae complex NOT DETECTED NOT DETECTED Final   Escherichia coli NOT DETECTED NOT DETECTED Final   Klebsiella aerogenes NOT DETECTED NOT DETECTED Final   Klebsiella oxytoca NOT DETECTED NOT DETECTED Final   Klebsiella pneumoniae NOT DETECTED NOT DETECTED Final   Proteus species NOT DETECTED NOT DETECTED Final   Salmonella species NOT DETECTED NOT DETECTED Final   Serratia marcescens NOT DETECTED NOT DETECTED Final   Haemophilus influenzae NOT DETECTED NOT DETECTED Final   Neisseria meningitidis NOT DETECTED NOT DETECTED Final   Pseudomonas aeruginosa NOT DETECTED NOT DETECTED Final   Stenotrophomonas maltophilia NOT DETECTED NOT DETECTED Final   Candida albicans NOT DETECTED NOT DETECTED Final   Candida auris NOT DETECTED NOT DETECTED Final   Candida glabrata NOT  DETECTED NOT DETECTED Final   Candida krusei NOT DETECTED NOT DETECTED Final   Candida parapsilosis NOT DETECTED NOT DETECTED Final   Candida tropicalis NOT DETECTED NOT DETECTED Final   Cryptococcus neoformans/gattii NOT DETECTED NOT DETECTED Final    Comment: Performed at [facility], [address], [place], [place] [address]  Resp Panel by RT-PCR (Flu A&B, Covid) Nasopharyngeal Swab     Status: None   Collection Time: 06/21/21  8:52 AM   Specimen: Nasopharyngeal Swab; Nasopharyngeal(NP) swabs in vial transport medium  Result Value Ref Range Status   SARS Coronavirus 2 by RT PCR NEGATIVE NEGATIVE Final    Comment: (NOTE) SARS-CoV-2 target nucleic acids are NOT DETECTED.  The SARS-CoV-2 RNA is generally detectable in upper respiratory specimens during the acute phase of infection. The lowest concentration of SARS-CoV-2 viral copies this assay can detect is 138 copies/mL. A negative result does not preclude SARS-Cov-2 infection and should not be used as the sole basis for treatment or other patient management decisions. A negative result may occur with  improper specimen collection/handling, submission of specimen other than nasopharyngeal swab, presence of viral mutation(s) within the areas targeted by this assay, and inadequate number of viral copies(<138 copies/mL). A negative result must be combined with clinical observations, patient history, and epidemiological information. The expected result is Negative.  Fact Sheet for Patients:  EntrepreneurPulse.com.au  Fact Sheet for Healthcare Providers:  IncredibleEmployment.be  This test is no t yet approved or cleared by the [place] FDA and  has been authorized for detection and/or diagnosis of SARS-CoV-2 by FDA under an Emergency Use Authorization (EUA). This EUA will remain  in effect (meaning this test can be used) for the duration of the COVID-19 declaration under Section  564(b)(1) of the Act, 21 U.S.C.section 360bbb-3(b)(1), unless the authorization is terminated  or revoked sooner.       Influenza A by PCR NEGATIVE NEGATIVE Final   Influenza B by PCR NEGATIVE NEGATIVE Final    Comment: (NOTE) The Xpert Xpress SARS-CoV-2/FLU/RSV plus assay is intended as an aid in the diagnosis of influenza from Nasopharyngeal swab specimens and should not be used as a sole basis for treatment. Nasal washings  and aspirates are unacceptable for Xpert Xpress SARS-CoV-2/FLU/RSV testing.  Fact Sheet for Patients: EntrepreneurPulse.com.au  Fact Sheet for Healthcare Providers: IncredibleEmployment.be  This test is not yet approved or cleared by the [place] FDA and has been authorized for detection and/or diagnosis of SARS-CoV-2 by FDA under an Emergency Use Authorization (EUA). This EUA will remain in effect (meaning this test can be used) for the duration of the COVID-19 declaration under Section 564(b)(1) of the Act, 21 U.S.C. section 360bbb-3(b)(1), unless the authorization is terminated or revoked.  Performed at [facility], [address], [place], [place] [address]   Blood culture (routine single)     Status: None (Preliminary result)   Collection Time: 06/23/21  8:40 AM   Specimen: Right Antecubital; Blood  Result Value Ref Range Status   Specimen Description   Final    RIGHT ANTECUBITAL BOTTLES DRAWN AEROBIC AND ANAEROBIC   Special Requests   Final    Blood Culture results may not be optimal due to an inadequate volume of blood received in culture bottles Performed at [facility], [address] [place], [place] [address]    Culture PENDING  Incomplete   Report Status PENDING  Incomplete     Radiology Studies: DG Chest Port 1 View  Result Date: 06/22/2021 CLINICAL DATA:  Port-A-Cath verification. EXAM: PORTABLE CHEST 1 VIEW COMPARISON:  None. FINDINGS: Accessed left chest port in place. The tip is in the  region of the upper SVC. No pneumothorax. Patient is mildly rotated. The heart is normal in size. Normal mediastinal contours. Minimal left basilar opacity felt to be a prominent epicardial fat pad. No confluent airspace disease. No pleural effusion. No pneumothorax. Surgical clips in the left axilla. No acute osseous abnormalities are seen. IMPRESSION: Accessed left chest port in place with tip in the region of the upper SVC. No pneumothorax. Electronically Signed   By: Keith Rake M.D.   On: 06/22/2021 18:17   DG Chest Port 1V same Day  Result Date: 06/22/2021 CLINICAL DATA:  Port-A-Cath removed EXAM: PORTABLE CHEST 1 VIEW COMPARISON:  06/22/2021 FINDINGS: Removal of left-sided central venous port. Clips in the left axilla. No focal opacity or pleural effusion. Normal heart size. No pneumothorax IMPRESSION: No active disease.  Removal of left-sided central venous port Electronically Signed   By: Donavan Foil M.D.   On: 06/22/2021 21:48    Scheduled Meds:  exemestane  25 mg Oral Daily   gabapentin  300 mg Oral BID   lidocaine (PF)  15 mL Intradermal Once   loratadine  10 mg Oral Daily   metoprolol succinate  25 mg Oral Daily   rosuvastatin  5 mg Oral QHS   sertraline  50 mg Oral Daily   topiramate  25 mg Oral BID   venlafaxine XR  150 mg Oral Daily   Continuous Infusions:  sodium chloride 50 mL/hr at 06/22/21 1350    ceFAZolin (ANCEF) IV 2 g (06/23/21 0554)     LOS: 2 days    Time spent: 30 minutes  Fredrik Rigger, Medical student [facility] Pager [phone]  If 7PM-7AM, please contact night-coverage [email or web address] Password [facility] 06/23/2021, 11:08 AM   ATTENDING NOTE  Patient seen and examined with Fredrik Rigger, Medical student. In addition to supervising the encounter, I played a key role in the decision making process as well as reviewed key findings. Thanks to Dr. Constance Haw for removing infected port.  Now focus of care is treating severe cellulitis and bacteremia  from Group  G strep.  Transition to IV ceftriaxone starting 06/24/21.     Gerlene Fee, MD  [facility] How to contact the [facility] Attending or Consulting provider [place] or covering provider during after hours [place], for this patient?  Check the care team in [facility] and look for a) attending/consulting [facility] provider listed and b) the [facility] team listed Log into [email or web address] and use [place]'s universal password to access. If you do not have the password, please contact the hospital operator. Locate the [facility] provider you are looking for under [facility] and page to a number that you can be directly reached. If you still have difficulty reaching the provider, please page the [facility] (Director on Call) for the Hospitalists listed on amion for assistance.

## 2021-06-24 ENCOUNTER — Inpatient Hospital Stay: Payer: Self-pay

## 2021-06-24 DIAGNOSIS — T80219D Unspecified infection due to central venous catheter, subsequent encounter: Secondary | ICD-10-CM

## 2021-06-24 DIAGNOSIS — I10 Essential (primary) hypertension: Secondary | ICD-10-CM

## 2021-06-24 LAB — CULTURE, BLOOD (ROUTINE X 2)
Special Requests: ADEQUATE
Special Requests: ADEQUATE

## 2021-06-24 LAB — MAGNESIUM: Magnesium: 2.3 mg/dL (ref 1.7–2.4)

## 2021-06-24 LAB — BASIC METABOLIC PANEL
Anion gap: 7 (ref 5–15)
BUN: 7 mg/dL (ref 6–20)
CO2: 18 mmol/L — ABNORMAL LOW (ref 22–32)
Calcium: 8.1 mg/dL — ABNORMAL LOW (ref 8.9–10.3)
Chloride: 116 mmol/L — ABNORMAL HIGH (ref 98–111)
Creatinine, Ser: 0.55 mg/dL (ref 0.44–1.00)
GFR, Estimated: >60 mL/min (ref >60)
Glucose, Bld: 95 mg/dL (ref 70–99)
Potassium: 3.7 mmol/L (ref 3.5–5.1)
Sodium: 141 mmol/L (ref 135–145)

## 2021-06-24 LAB — CBC WITH DIFFERENTIAL/PLATELET
Abs Immature Granulocytes: 0.04 10*3/uL (ref 0.00–0.07)
Basophils Absolute: 0 10*3/uL (ref 0.0–0.1)
Basophils Relative: 1 %
Eosinophils Absolute: 0.1 10*3/uL (ref 0.0–0.5)
Eosinophils Relative: 2 %
HCT: 35.4 % — ABNORMAL LOW (ref 36.0–46.0)
Hemoglobin: 11.5 g/dL — ABNORMAL LOW (ref 12.0–15.0)
Immature Granulocytes: 1 %
Lymphocytes Relative: 25 %
Lymphs Abs: 1.3 10*3/uL (ref 0.7–4.0)
MCH: 31.8 pg (ref 26.0–34.0)
MCHC: 32.5 g/dL (ref 30.0–36.0)
MCV: 97.8 fL (ref 80.0–100.0)
Monocytes Absolute: 0.4 10*3/uL (ref 0.1–1.0)
Monocytes Relative: 9 %
Neutro Abs: 3.3 10*3/uL (ref 1.7–7.7)
Neutrophils Relative %: 62 %
Platelets: 107 10*3/uL — ABNORMAL LOW (ref 150–400)
RBC: 3.62 MIL/uL — ABNORMAL LOW (ref 3.87–5.11)
RDW: 14.2 % (ref 11.5–15.5)
WBC: 5.2 10*3/uL (ref 4.0–10.5)
nRBC: 0 % (ref 0.0–0.2)

## 2021-06-24 NOTE — Progress Notes (Addendum)
Patient from home. Will need 14 days of IV abx. Discussed infusion and [facility] providers. Referral made to [facility] with [facility] infusion.  Patient resides at [address] [place],  [address].     Damian Hofstra, Clydene Pugh, LCSW

## 2021-06-24 NOTE — Progress Notes (Signed)
Patient's IV to R arm infiltrated, with swelling and intense pain. Provided ICE and elevated limb. Removed access. ICE applied for 20 mins, with significant relief to patient. #20 to back of the right arm started by PACU nurse after significant discussion with medical team. IV meds infusing. Patient tolerated. Dressing to removed port site completed. Patient did not tolerate procedure well, wincing and moaning during removal of gauze. She was given pain medication prior. Site looks well, no drainage or odor. Applied paper tape over the gauze as the other tape was very irritating to skin. Blistering appears better than last I assessed patient. She reports less pain.

## 2021-06-24 NOTE — Progress Notes (Signed)
[facility]  PACU RN able to get a PIV on the right arm to last over the weekend. Plan for tunneled central line for longterm access for antibiotics on Monday. What to have a few days or negative culture before doing tunneled IJ. Will likely go on the right as this side is more spared by cellulitis and her port was on the left.  Will discuss with patient on Sunday.   Curlene Labrum, MD [facility] [address] [place], [place] [address] [phone] (office)

## 2021-06-24 NOTE — Progress Notes (Signed)
Patient sitting up in bed on last round. In good spirits with no pain at this time. No needs expressed at this time.

## 2021-06-24 NOTE — Progress Notes (Signed)
PROGRESS NOTE    Sharon Phillips  [id number] DOB: 02/29/1971 DOA: 06/21/2021 PCP: Patient, [name]    Brief Narrative:   Sharon Phillips is a 50 y.o. female with medical history significant for breast cancer-currently off chemotherapy and in remission from last follow up, hypertension, dyslipidemia, and depression who presented to the ED with complaints of left upper arm redness and pain. She states that the pain has been intense and restricting her movement. Denies any nausea, vomiting, fever, neck stiffness. The redness has expanded throughout her abdomen, but she was mostly unaware of the spread. Patient was tachycardic.   Of note she has a history of migraine and also presented with headache. Denies photophobia.   Workup in the ED showed normal WBC, platelet count 112,000. Left upper extremity US was performed and there was no sign of DVT. CT head was obtained to evaluate for CVA showed no acute findings. Concern was for hematogenous spread of cellulitis for which Unasyn was ordered by ED provider.   7/19 - She had complaint of lip edema, raising concern for angioedema and allergic reaction in setting of extensive cellulitis. She was on lisinopril so that was held. Serial exam showed stable and slight decrease in lip swelling with no involvement of tongue/airway. Patient did have a fever up to 103.3, tachycardia to 132 and BP 157/74 so metoprolol was given for rate control. Patient's fever was controlled with tylenol, fioricet and toradol.   7/20  AM Erythema spread throughout torso, and examination of the port shows like the infection was starting to spread from there. Patient reports that pain has improved and that she has more mobility with the arm.  Dr. Constance Haw removed the port 7/20 evening with no complications  [id number] - patient's cellulitis and erythema have improved with some clearing of erythema on arms. Blood culture from 7/19 grew Group G streptococcus. Will transition  antibiotics to ceftriaxone 2g IV q24h for easier dosing compared to mainstay treatment penicillin. Patient no longer has headache.   7/22 - lost IV access, spoke with Dr. Constance Haw planning for tunneled IJ cath on Monday.  Repeated blood cultures 7/21 no growth to date.   Assessment & Plan:   Principal Problem:   Cellulitis Active Problems:   Bacteremia   Infection due to Port-A-Cath   Cellulitis of left upper extremity   Fever, unspecified   Hypertension   History of breast cancer   High cholesterol   Immunocompromised (HCC)  Sepsis secondary to Streptococcus bacteremia  and cellulitis of trunk and left upper extremity - case discussed over phone with ID (Dr. Linus Salmons); appreciates recommendations -Currently nonpurulent, but quite extensive and painful -Left chest wall port with erythema radiating out from port and involves entire torso and down to left arm. No significant tenderness or fluctuant noted, no concern for abscess - Patient still has fever T max 102.9 overnight; broke with antipyretics.  -Per infectious disease, IV Ancef until sensitivity comes back, then can target with IV penicillin x 14 days. No need for TEE at this time to evaluate for endocarditis. - Port removed by Dr. Constance Haw (7/20). Dressing in placed - Repeat blood culture 7/21 no growth to date - continue rocephin 2g q24h as 3rd gen cephalosporin is a good substitute and will provide easier dosing (once vs four times a day) - spoke with Dr. Constance Haw, planning tunneled cath placement on 7/25, with plans to DC home on IV antibiotics to complete full 14 day course.     Migraine headache -  No acute complaint today -Continue home medications for pain management   Sinus tachycardia RESOLVED  -Monitor on telemetry -Likely related to bacteremia -Resumed home metoprolol   Thrombocytopenia - Patient had a large bruise on right thigh,  - Avoid heparin agents - suspect this is secondary to sepsis  - outpatient hematology  follow up   History of hypertension/dyslipidemia - stable  -Continue home medications   History of depression -Continue home medications    DVT prophylaxis: SCDs Code Status: Full Family Communication: Spoke with son on phone 7/21  daughter at bedside Disposition Plan: home after infection is treated   Consultants:  General surgery (Dr. Constance Haw) Infectious disease (over the phone Dr. Linus Salmons)   Procedures:  Central venous port removal by Dr. Constance Haw 06/22/21    Antimicrobials:  Antibiotics Given (last 72 hours)     Date/Time Action Medication Dose Rate   06/21/21 1114 New Bag/Given   Ampicillin-Sulbactam (UNASYN) 3 g in sodium chloride 0.9 % 100 mL IVPB 3 g 200 mL/hr   06/21/21 1632 New Bag/Given   ceFAZolin (ANCEF) IVPB 2g/100 mL premix 2 g 200 mL/hr   06/21/21 2221 New Bag/Given   ceFAZolin (ANCEF) IVPB 2g/100 mL premix 2 g 200 mL/hr   06/22/21 0539 New Bag/Given   ceFAZolin (ANCEF) IVPB 2g/100 mL premix 2 g 200 mL/hr   06/22/21 1420 New Bag/Given   ceFAZolin (ANCEF) IVPB 2g/100 mL premix 2 g 200 mL/hr   06/22/21 2102 New Bag/Given   ceFAZolin (ANCEF) IVPB 2g/100 mL premix 2 g 200 mL/hr   06/23/21 0554 New Bag/Given   ceFAZolin (ANCEF) IVPB 2g/100 mL premix 2 g 200 mL/hr   06/23/21 1432 New Bag/Given   ceFAZolin (ANCEF) IVPB 2g/100 mL premix 2 g 200 mL/hr   06/23/21 2103 New Bag/Given   ceFAZolin (ANCEF) IVPB 2g/100 mL premix 2 g 200 mL/hr   06/24/21 [id number] New Bag/Given   cefTRIAXone (ROCEPHIN) 2 g in sodium chloride 0.9 % 100 mL IVPB 2 g 200 mL/hr      Subjective: Pt reports that painful rash seems to be improving, she has some tenderness at the port site but likely from the bandage tape used. No chills.   No fever in last 24 hours.    Objective: Vitals:   06/23/21 1256 06/23/21 1643 06/23/21 2041 06/24/21 0558  BP: 115/75 133/82 123/67 117/66  Pulse: 99 (!) 104 (!) 102 94  Resp: '18 18 19 19  '$ Temp: 98.1 F (36.7 C) 98.5 F (36.9 C) 99.6 F (37.6 C) 98.2 F  (36.8 C)  TempSrc: Oral Oral Oral Oral  SpO2: 98% 98% 100% 100%  Weight:      Height:        Intake/Output Summary (Last 24 hours) at 06/24/2021 1046 Last data filed at 06/24/2021 0900 Gross per 24 hour  Intake 240 ml  Output --  Net 240 ml   Filed Weights   06/21/21 0717  Weight: 77.1 kg   Examination:  General exam: Appears calm and comfortable  Respiratory system: Clear to auscultation. Respiratory effort normal. Cardiovascular system: S1 & S2 heard. No JVD, murmurs, rubs, gallops or clicks. No pedal edema. Gastrointestinal system: Abdomen is nondistended, soft and nontender. No organomegaly or masses felt. Normal bowel sounds heard. Central nervous system: Alert and oriented. No focal neurological deficits. Extremities: Symmetric 5 x 5 power. Skin: Port site looks good, no drainage seen, erythematous rash persists on trunk and left upper extremity continues to improve, cool to touch.   Psychiatry: Judgement and  insight appear normal. Mood & affect appropriate.   Data Reviewed: I have personally reviewed following labs and imaging studies  CBC: Recent Labs  Lab 06/21/21 0852 06/22/21 0524 06/23/21 0456 06/24/21 0518  WBC 5.8 4.2 4.4 5.2  NEUTROABS  --   --   --  3.3  HGB 14.3 12.1 11.4* 11.5*  HCT 42.8 36.9 33.7* 35.4*  MCV 94.7 96.3 94.9 97.8  PLT 112* 101* 100* [id number]*   Basic Metabolic Panel: Recent Labs  Lab 06/21/21 0852 06/22/21 0524 06/23/21 0456 06/24/21 0518  NA 135 134* 135 141  K 3.6 3.7 3.3* 3.7  CL 106 108 109 116*  CO2 22 21* 20* 18*  GLUCOSE 139* 119* 111* 95  BUN '13 9 9 7  '$ CREATININE 0.87 0.79 0.74 0.55  CALCIUM 8.7* 8.1* 8.0* 8.1*  MG  --  1.9 1.9 2.3   GFR: Estimated Creatinine Clearance: 86.5 mL/min (by C-G formula based on SCr of 0.55 mg/dL). Liver Function Tests: No results for input(s): AST, ALT, ALKPHOS, BILITOT, PROT, ALBUMIN in the last 168 hours. No results for input(s): LIPASE, AMYLASE in the last 168 hours. No results for  input(s): AMMONIA in the last 168 hours. Coagulation Profile: No results for input(s): INR, PROTIME in the last 168 hours. Cardiac Enzymes: No results for input(s): CKTOTAL, CKMB, CKMBINDEX, TROPONINI in the last 168 hours. BNP (last 3 results) No results for input(s): PROBNP in the last 8760 hours. HbA1C: No results for input(s): HGBA1C in the last 72 hours. CBG: No results for input(s): GLUCAP in the last 168 hours. Lipid Profile: No results for input(s): CHOL, HDL, LDLCALC, TRIG, CHOLHDL, LDLDIRECT in the last 72 hours. Thyroid Function Tests: No results for input(s): TSH, T4TOTAL, FREET4, T3FREE, THYROIDAB in the last 72 hours. Anemia Panel: No results for input(s): VITAMINB12, FOLATE, FERRITIN, TIBC, IRON, RETICCTPCT in the last 72 hours. Urine analysis: No results found for: COLORURINE, APPEARANCEUR, [place], [place], GLUCOSEU, [place], [place], [place], [place], [place], NITRITE, LEUKOCYTESUR  Recent Results (from the past 240 hour(s))  Blood culture (routine x 2)     Status: Abnormal   Collection Time: 06/21/21  7:33 AM   Specimen: Chest; Blood  Result Value Ref Range Status   Specimen Description   Final    CHEST BOTTLES DRAWN AEROBIC AND ANAEROBIC Performed at [facility], [address] [place], [place] [address]    Special Requests   Final    Blood Culture adequate volume Performed at [facility], [address] [place], [place] [address]    Culture  Setup Time   Final    GRAM POSITIVE COCCI IN BOTH AEROBIC AND ANAEROBIC BOTTLES Gram Stain Report Called to,Read Back By and Verified With: brown,p @ 2133 by matthews,b 7.19.22 [facility] Performed at [facility], [address] [place], [place] [address]    Culture (A)  Final    STREPTOCOCCUS GROUP G SUSCEPTIBILITIES PERFORMED ON PREVIOUS CULTURE WITHIN THE LAST 5 DAYS. Performed at [facility], [place] [address] [place], [place] [address]    Report Status 06/24/2021 FINAL  Final  Blood  culture (routine x 2)     Status: Abnormal   Collection Time: 06/21/21  8:51 AM   Specimen: Chest; Blood  Result Value Ref Range Status   Specimen Description   Final    CHEST BOTTLES DRAWN AEROBIC AND ANAEROBIC Performed at [facility], [address] [place], [place] [address]    Special Requests   Final    Blood Culture  adequate volume Performed at [facility], [address], [place], [place] [address]    Culture  Setup Time   Final    GRAM POSITIVE COCCI IN BOTH AEROBIC AND ANAEROBIC BOTTLES Gram Stain Report Called to,Read Back By and Verified With: Brown,P '@2133'$  by Matthews,B 7.19.22 [facility]    Culture STREPTOCOCCUS GROUP G (A)  Final   Report Status 06/24/2021 FINAL  Final   Organism ID, Bacteria STREPTOCOCCUS GROUP G  Final      Susceptibility   Streptococcus group g - MIC*    CLINDAMYCIN <=0.25 SENSITIVE Sensitive     AMPICILLIN <=0.25 SENSITIVE Sensitive     ERYTHROMYCIN <=0.12 SENSITIVE Sensitive     VANCOMYCIN 0.5 SENSITIVE Sensitive     CEFTRIAXONE <=0.12 SENSITIVE Sensitive     LEVOFLOXACIN 0.5 SENSITIVE Sensitive     PENICILLIN Value in next row Sensitive      SENSITIVE0.06    * STREPTOCOCCUS GROUP G  Blood Culture ID Panel (Reflexed)     Status: Abnormal   Collection Time: 06/21/21  8:51 AM  Result Value Ref Range Status   Enterococcus faecalis NOT DETECTED NOT DETECTED Final   Enterococcus Faecium NOT DETECTED NOT DETECTED Final   Listeria monocytogenes NOT DETECTED NOT DETECTED Final   Staphylococcus species NOT DETECTED NOT DETECTED Final   Staphylococcus aureus (BCID) NOT DETECTED NOT DETECTED Final   Staphylococcus epidermidis NOT DETECTED NOT DETECTED Final   Staphylococcus lugdunensis NOT DETECTED NOT DETECTED Final   Streptococcus species DETECTED (A) NOT DETECTED Final    Comment: Not Enterococcus species, Streptococcus agalactiae, Streptococcus pyogenes, or Streptococcus pneumoniae. CRITICAL RESULT CALLED TO, READ BACK BY AND VERIFIED  WITH: P. BROWN RN, AT 0321 06/22/21 D. VANHOOK    Streptococcus agalactiae NOT DETECTED NOT DETECTED Final   Streptococcus pneumoniae NOT DETECTED NOT DETECTED Final   Streptococcus pyogenes NOT DETECTED NOT DETECTED Final   A.calcoaceticus-baumannii NOT DETECTED NOT DETECTED Final   Bacteroides fragilis NOT DETECTED NOT DETECTED Final   Enterobacterales NOT DETECTED NOT DETECTED Final   Enterobacter cloacae complex NOT DETECTED NOT DETECTED Final   Escherichia coli NOT DETECTED NOT DETECTED Final   Klebsiella aerogenes NOT DETECTED NOT DETECTED Final   Klebsiella oxytoca NOT DETECTED NOT DETECTED Final   Klebsiella pneumoniae NOT DETECTED NOT DETECTED Final   Proteus species NOT DETECTED NOT DETECTED Final   Salmonella species NOT DETECTED NOT DETECTED Final   Serratia marcescens NOT DETECTED NOT DETECTED Final   Haemophilus influenzae NOT DETECTED NOT DETECTED Final   Neisseria meningitidis NOT DETECTED NOT DETECTED Final   Pseudomonas aeruginosa NOT DETECTED NOT DETECTED Final   Stenotrophomonas maltophilia NOT DETECTED NOT DETECTED Final   Candida albicans NOT DETECTED NOT DETECTED Final   Candida auris NOT DETECTED NOT DETECTED Final   Candida glabrata NOT DETECTED NOT DETECTED Final   Candida krusei NOT DETECTED NOT DETECTED Final   Candida parapsilosis NOT DETECTED NOT DETECTED Final   Candida tropicalis NOT DETECTED NOT DETECTED Final   Cryptococcus neoformans/gattii NOT DETECTED NOT DETECTED Final    Comment: Performed at [facility], [address] [place], [place] [address]  Resp Panel by RT-PCR (Flu A&B, Covid) Nasopharyngeal Swab     Status: None   Collection Time: 06/21/21  8:52 AM   Specimen: Nasopharyngeal Swab; Nasopharyngeal(NP) swabs in vial transport medium  Result Value Ref Range Status   SARS Coronavirus 2 by RT PCR NEGATIVE NEGATIVE Final    Comment: (NOTE) SARS-CoV-2 target nucleic acids are  NOT DETECTED.  The SARS-CoV-2 RNA is generally detectable  in upper respiratory specimens during the acute phase of infection. The lowest concentration of SARS-CoV-2 viral copies this assay can detect is 138 copies/mL. A negative result does not preclude SARS-Cov-2 infection and should not be used as the sole basis for treatment or other patient management decisions. A negative result may occur with  improper specimen collection/handling, submission of specimen other than nasopharyngeal swab, presence of viral mutation(s) within the areas targeted by this assay, and inadequate number of viral copies(<138 copies/mL). A negative result must be combined with clinical observations, patient history, and epidemiological information. The expected result is Negative.  Fact Sheet for Patients:  EntrepreneurPulse.com.au  Fact Sheet for Healthcare Providers:  IncredibleEmployment.be  This test is no t yet approved or cleared by the [place] FDA and  has been authorized for detection and/or diagnosis of SARS-CoV-2 by FDA under an Emergency Use Authorization (EUA). This EUA will remain  in effect (meaning this test can be used) for the duration of the COVID-19 declaration under Section 564(b)(1) of the Act, 21 U.S.C.section 360bbb-3(b)(1), unless the authorization is terminated  or revoked sooner.       Influenza A by PCR NEGATIVE NEGATIVE Final   Influenza B by PCR NEGATIVE NEGATIVE Final    Comment: (NOTE) The Xpert Xpress SARS-CoV-2/FLU/RSV plus assay is intended as an aid in the diagnosis of influenza from Nasopharyngeal swab specimens and should not be used as a sole basis for treatment. Nasal washings and aspirates are unacceptable for Xpert Xpress SARS-CoV-2/FLU/RSV testing.  Fact Sheet for Patients: EntrepreneurPulse.com.au  Fact Sheet for Healthcare Providers: IncredibleEmployment.be  This test is not yet approved or cleared by the [place] FDA and has  been authorized for detection and/or diagnosis of SARS-CoV-2 by FDA under an Emergency Use Authorization (EUA). This EUA will remain in effect (meaning this test can be used) for the duration of the COVID-19 declaration under Section 564(b)(1) of the Act, 21 U.S.C. section 360bbb-3(b)(1), unless the authorization is terminated or revoked.  Performed at [facility], [address], [place], [place] [address]   Blood culture (routine single)     Status: None (Preliminary result)   Collection Time: 06/23/21  8:40 AM   Specimen: Right Antecubital; Blood  Result Value Ref Range Status   Specimen Description   Final    RIGHT ANTECUBITAL BOTTLES DRAWN AEROBIC AND ANAEROBIC   Special Requests   Final    Blood Culture results may not be optimal due to an inadequate volume of blood received in culture bottles   Culture   Final    NO GROWTH < 24 HOURS Performed at [facility], [address], [place], [place] [address]    Report Status PENDING  Incomplete     Radiology Studies: DG Chest Port 1 View  Result Date: 06/22/2021 CLINICAL DATA:  Port-A-Cath verification. EXAM: PORTABLE CHEST 1 VIEW COMPARISON:  None. FINDINGS: Accessed left chest port in place. The tip is in the region of the upper SVC. No pneumothorax. Patient is mildly rotated. The heart is normal in size. Normal mediastinal contours. Minimal left basilar opacity felt to be a prominent epicardial fat pad. No confluent airspace disease. No pleural effusion. No pneumothorax. Surgical clips in the left axilla. No acute osseous abnormalities are seen. IMPRESSION: Accessed left chest port in place with tip in the region of the upper SVC. No pneumothorax. Electronically Signed   By: Keith Rake M.D.   On: 06/22/2021 18:17  DG Chest Port 1V same Day  Result Date: 06/22/2021 CLINICAL DATA:  Port-A-Cath removed EXAM: PORTABLE CHEST 1 VIEW COMPARISON:  06/22/2021 FINDINGS: Removal of left-sided central venous port. Clips in the left  axilla. No focal opacity or pleural effusion. Normal heart size. No pneumothorax IMPRESSION: No active disease.  Removal of left-sided central venous port Electronically Signed   By: Donavan Foil M.D.   On: 06/22/2021 21:48   [place] EKG SITE RITE  Result Date: 06/24/2021 If [facility] image not attached, placement could not be confirmed due to current cardiac rhythm.   Scheduled Meds:  exemestane  25 mg Oral Daily   gabapentin  300 mg Oral BID   lidocaine (PF)  15 mL Intradermal Once   loratadine  10 mg Oral Daily   metoprolol succinate  25 mg Oral Daily   rosuvastatin  5 mg Oral QHS   sertraline  50 mg Oral Daily   topiramate  25 mg Oral BID   venlafaxine XR  150 mg Oral Daily   Continuous Infusions:  cefTRIAXone (ROCEPHIN)  IV 2 g (06/24/21 0917)     LOS: 3 days   Time spent: 40 minutes  If 7PM-7AM, please contact night-coverage [email or web address] Password [facility] 06/24/2021, 10:46 AM    Gerlene Fee, MD  [facility] How to contact the [facility] Attending or Consulting provider [place] or covering provider during after hours [place], for this patient?  Check the care team in [facility] and look for a) attending/consulting [facility] provider listed and b) the [facility] team listed Log into [email or web address] and use [place]'s universal password to access. If you do not have the password, please contact the hospital operator. Locate the [facility] provider you are looking for under [facility] and page to a number that you can be directly reached. If you still have difficulty reaching the provider, please page the [facility] (Director on Call) for the Hospitalists listed on amion for assistance.

## 2021-06-25 LAB — CBC WITH DIFFERENTIAL/PLATELET
Abs Immature Granulocytes: 0.06 10*3/uL (ref 0.00–0.07)
Basophils Absolute: 0 10*3/uL (ref 0.0–0.1)
Basophils Relative: 0 %
Eosinophils Absolute: 0.1 10*3/uL (ref 0.0–0.5)
Eosinophils Relative: 2 %
HCT: 34.3 % — ABNORMAL LOW (ref 36.0–46.0)
Hemoglobin: 11.2 g/dL — ABNORMAL LOW (ref 12.0–15.0)
Immature Granulocytes: 1 %
Lymphocytes Relative: 31 %
Lymphs Abs: 1.4 10*3/uL (ref 0.7–4.0)
MCH: 31.4 pg (ref 26.0–34.0)
MCHC: 32.7 g/dL (ref 30.0–36.0)
MCV: 96.1 fL (ref 80.0–100.0)
Monocytes Absolute: 0.4 10*3/uL (ref 0.1–1.0)
Monocytes Relative: 8 %
Neutro Abs: 2.6 10*3/uL (ref 1.7–7.7)
Neutrophils Relative %: 58 %
Platelets: 151 10*3/uL (ref 150–400)
RBC: 3.57 MIL/uL — ABNORMAL LOW (ref 3.87–5.11)
RDW: 14.5 % (ref 11.5–15.5)
WBC: 4.5 10*3/uL (ref 4.0–10.5)
nRBC: 0 % (ref 0.0–0.2)

## 2021-06-25 LAB — BASIC METABOLIC PANEL
Anion gap: 6 (ref 5–15)
BUN: 7 mg/dL (ref 6–20)
CO2: 24 mmol/L (ref 22–32)
Calcium: 8.4 mg/dL — ABNORMAL LOW (ref 8.9–10.3)
Chloride: 112 mmol/L — ABNORMAL HIGH (ref 98–111)
Creatinine, Ser: 0.62 mg/dL (ref 0.44–1.00)
GFR, Estimated: >60 mL/min (ref >60)
Glucose, Bld: 100 mg/dL — ABNORMAL HIGH (ref 70–99)
Potassium: 3.6 mmol/L (ref 3.5–5.1)
Sodium: 142 mmol/L (ref 135–145)

## 2021-06-25 LAB — MAGNESIUM: Magnesium: 2.3 mg/dL (ref 1.7–2.4)

## 2021-06-25 MED ORDER — HYDROXYZINE HCL 10 MG PO TABS: 10.0000 mg | ORAL_TABLET | Freq: Three times a day (TID) | ORAL | Status: DC | PRN

## 2021-06-25 MED ORDER — DIPHENHYDRAMINE-ZINC ACETATE 2-0.1 % EX CREA
TOPICAL_CREAM | Freq: Three times a day (TID) | CUTANEOUS | Status: DC | PRN
  Filled 2021-06-25: qty 28

## 2021-06-25 NOTE — Plan of Care (Signed)
  Problem: Education: Goal: Knowledge of General Education information will improve Description Including pain rating scale, medication(s)/side effects and non-pharmacologic comfort measures Outcome: Progressing   Problem: Health Behavior/Discharge Planning: Goal: Ability to manage health-related needs will improve Outcome: Progressing   

## 2021-06-25 NOTE — Progress Notes (Signed)
PROGRESS NOTE    Sharon Phillips  [id number] DOB: May 27, 1971 DOA: 06/21/2021 PCP: Patient, [name]    Brief Narrative:   Sharon Phillips is a 50 y.o. female with medical history significant for breast cancer-currently off chemotherapy and in remission from last follow up, hypertension, dyslipidemia, and depression who presented to the ED with complaints of left upper arm redness and pain. She states that the pain has been intense and restricting her movement. Denies any nausea, vomiting, fever, neck stiffness. The redness has expanded throughout her abdomen, but she was mostly unaware of the spread. Patient was tachycardic.   Of note she has a history of migraine and also presented with headache. Denies photophobia.   Workup in the ED showed normal WBC, platelet count 112,000. Left upper extremity US was performed and there was no sign of DVT. CT head was obtained to evaluate for CVA showed no acute findings. Concern was for hematogenous spread of cellulitis for which Unasyn was ordered by ED provider.   7/19 - She had complaint of lip edema, raising concern for angioedema and allergic reaction in setting of extensive cellulitis. She was on lisinopril so that was held. Serial exam showed stable and slight decrease in lip swelling with no involvement of tongue/airway. Patient did have a fever up to 103.3, tachycardia to 132 and BP 157/74 so metoprolol was given for rate control. Patient's fever was controlled with tylenol, fioricet and toradol.   7/20  AM Erythema spread throughout torso, and examination of the port shows like the infection was starting to spread from there. Patient reports that pain has improved and that she has more mobility with the arm.  Dr. Constance Haw removed the port 7/20 evening with no complications  [id number] - patient's cellulitis and erythema have improved with some clearing of erythema on arms. Blood culture from 7/19 grew Group G streptococcus. Will transition  antibiotics to ceftriaxone 2g IV q24h for easier dosing compared to mainstay treatment penicillin. Patient no longer has headache.   7/22 - lost IV access, spoke with Dr. Constance Haw planning for tunneled IJ cath on Monday.  Repeated blood cultures 7/21 no growth to date.   7/22 - Pt has peripheral IV, rash and skin pain improving. Pt agreeable to receiving tunneled IJ cath on Monday and home IV antibiotics to complete full 14 day course of therapy.   Assessment & Plan:   Principal Problem:   Cellulitis Active Problems:   Bacteremia   Infection due to Port-A-Cath   Cellulitis of left upper extremity   Fever, unspecified   Hypertension   History of breast cancer   High cholesterol   Immunocompromised (HCC)  Sepsis secondary to Streptococcus bacteremia  and cellulitis of trunk and left upper extremity - case discussed over phone with ID (Dr. Linus Salmons); appreciates recommendations -Currently nonpurulent, but quite extensive and painful -Left chest wall port with erythema radiating out from port and involves entire torso and down to left arm. No significant tenderness or fluctuant noted, no concern for abscess - Patient still has fever T max 102.9 overnight; broke with antipyretics.  -Per infectious disease, IV Ancef until sensitivity comes back, then can target with IV penicillin x 14 days. No need for TEE at this time to evaluate for endocarditis. - Port removed by Dr. Constance Haw (7/20). Dressing in placed - Repeated blood culture 7/21 no growth to date - continue rocephin 2g q24h as 3rd gen cephalosporin is a good substitute and will provide easier dosing (once vs four  times a day) - spoke with Dr. Constance Haw, planning tunneled cath placement on 7/25, with plans to DC home on IV antibiotics to complete full 14 day course.     Migraine headache - No acute complaint today -Continue home medications for pain management   Sinus tachycardia RESOLVED  -Monitor on telemetry -Likely related to  sepsis -Resumed home metoprolol   Thrombocytopenia - RESOLVED - Patient had a large bruise on right thigh,  - Avoid heparin agents - suspect this is secondary to sepsis  - outpatient hematology follow up   History of hypertension/dyslipidemia - stable  -Continue home medications   History of depression -Continue home medications  Pruritus  - this is from resolving severe cellulitis  - topical benadryl creme and oral hydroxyzine ordered as needed    DVT prophylaxis: SCDs Code Status: Full Family Communication: Spoke with son on phone 7/21  daughter at bedside 7/23 Disposition Plan: home after central line placed for home IV antibiotics   Consultants:  General surgery (Dr. Constance Haw) Infectious disease (over the phone Dr. Linus Salmons)   Procedures:  Central venous port removal by Dr. Constance Haw 06/22/21    Antimicrobials:  Antibiotics Given (last 72 hours)     Date/Time Action Medication Dose Rate   06/22/21 1420 New Bag/Given   ceFAZolin (ANCEF) IVPB 2g/100 mL premix 2 g 200 mL/hr   06/22/21 2102 New Bag/Given   ceFAZolin (ANCEF) IVPB 2g/100 mL premix 2 g 200 mL/hr   06/23/21 0554 New Bag/Given   ceFAZolin (ANCEF) IVPB 2g/100 mL premix 2 g 200 mL/hr   06/23/21 1432 New Bag/Given   ceFAZolin (ANCEF) IVPB 2g/100 mL premix 2 g 200 mL/hr   06/23/21 2103 New Bag/Given   ceFAZolin (ANCEF) IVPB 2g/100 mL premix 2 g 200 mL/hr   06/24/21 [id number] New Bag/Given   cefTRIAXone (ROCEPHIN) 2 g in sodium chloride 0.9 % 100 mL IVPB 2 g 200 mL/hr   06/25/21 1036 New Bag/Given  [medication not on floor]   cefTRIAXone (ROCEPHIN) 2 g in sodium chloride 0.9 % 100 mL IVPB 2 g 200 mL/hr      Subjective: Pt reports that painful rash continues to improve but now having pruritus on the healing skin, trying not to scratch   Objective: Vitals:   06/24/21 0558 06/24/21 1306 06/24/21 1953 06/25/21 0545  BP: 117/66 121/80 (!) 145/79 130/61  Pulse: 94 88 85 90  Resp: '19 18 16 18  '$ Temp: 98.2 F (36.8 C)  98.2 F (36.8 C) 98 F (36.7 C) 98.2 F (36.8 C)  TempSrc: Oral Oral Oral Oral  SpO2: 100% 100% 100% 98%  Weight:      Height:        Intake/Output Summary (Last 24 hours) at 06/25/2021 1114 Last data filed at 06/25/2021 0900 Gross per 24 hour  Intake 555.44 ml  Output --  Net 555.44 ml   Filed Weights   06/21/21 0717  Weight: 77.1 kg   Examination:  General exam: Appears calm and comfortable  Respiratory system: Clear to auscultation. Respiratory effort normal. Cardiovascular system: S1 & S2 heard. No JVD, murmurs, rubs, gallops or clicks. No pedal edema. Gastrointestinal system: Abdomen is nondistended, soft and nontender. No organomegaly or masses felt. Normal bowel sounds heard. Central nervous system: Alert and oriented. No focal neurological deficits. Extremities: Symmetric 5 x 5 power. Skin: Port site healing well, no drainage seen, erythematous rash nearly resolved on trunk and left upper extremity continues to improve, cool to touch.   Psychiatry: Judgement and insight  appear normal. Mood & affect appropriate.   Data Reviewed: I have personally reviewed following labs and imaging studies  CBC: Recent Labs  Lab 06/21/21 0852 06/22/21 0524 06/23/21 0456 06/24/21 0518 06/25/21 0454  WBC 5.8 4.2 4.4 5.2 4.5  NEUTROABS  --   --   --  3.3 2.6  HGB 14.3 12.1 11.4* 11.5* 11.2*  HCT 42.8 36.9 33.7* 35.4* 34.3*  MCV 94.7 96.3 94.9 97.8 96.1  PLT 112* 101* 100* 107* [id number]   Basic Metabolic Panel: Recent Labs  Lab 06/21/21 0852 06/22/21 0524 06/23/21 0456 06/24/21 0518 06/25/21 0454  NA 135 134* 135 141 142  K 3.6 3.7 3.3* 3.7 3.6  CL 106 108 109 116* 112*  CO2 22 21* 20* 18* 24  GLUCOSE 139* 119* 111* 95 100*  BUN '13 9 9 7 7  '$ CREATININE 0.87 0.79 0.74 0.55 0.62  CALCIUM 8.7* 8.1* 8.0* 8.1* 8.4*  MG  --  1.9 1.9 2.3 2.3   GFR: Estimated Creatinine Clearance: 86.5 mL/min (by C-G formula based on SCr of 0.62 mg/dL). Liver Function Tests: No results for  input(s): AST, ALT, ALKPHOS, BILITOT, PROT, ALBUMIN in the last 168 hours. No results for input(s): LIPASE, AMYLASE in the last 168 hours. No results for input(s): AMMONIA in the last 168 hours. Coagulation Profile: No results for input(s): INR, PROTIME in the last 168 hours. Cardiac Enzymes: No results for input(s): CKTOTAL, CKMB, CKMBINDEX, TROPONINI in the last 168 hours. BNP (last 3 results) No results for input(s): PROBNP in the last 8760 hours. HbA1C: No results for input(s): HGBA1C in the last 72 hours. CBG: No results for input(s): GLUCAP in the last 168 hours. Lipid Profile: No results for input(s): CHOL, HDL, LDLCALC, TRIG, CHOLHDL, LDLDIRECT in the last 72 hours. Thyroid Function Tests: No results for input(s): TSH, T4TOTAL, FREET4, T3FREE, THYROIDAB in the last 72 hours. Anemia Panel: No results for input(s): VITAMINB12, FOLATE, FERRITIN, TIBC, IRON, RETICCTPCT in the last 72 hours. Urine analysis: No results found for: COLORURINE, APPEARANCEUR, [place], [place], GLUCOSEU, No Name, [place], [place], [place], [place], NITRITE, LEUKOCYTESUR  Recent Results (from the past 240 hour(s))  Blood culture (routine x 2)     Status: Abnormal   Collection Time: 06/21/21  7:33 AM   Specimen: Chest; Blood  Result Value Ref Range Status   Specimen Description   Final    CHEST BOTTLES DRAWN AEROBIC AND ANAEROBIC Performed at [facility], [address], [place], [place] [address]    Special Requests   Final    Blood Culture adequate volume Performed at [facility], [address] [place], [place] [address]    Culture  Setup Time   Final    GRAM POSITIVE COCCI IN BOTH AEROBIC AND ANAEROBIC BOTTLES Gram Stain Report Called to,Read Back By and Verified With: brown,p @ 2133 by matthews,b 7.19.22 [facility] Performed at [facility], [address], [place], [place] [address]    Culture (A)  Final    STREPTOCOCCUS GROUP G SUSCEPTIBILITIES PERFORMED ON PREVIOUS  CULTURE WITHIN THE LAST 5 DAYS. Performed at [facility], [place] [address] [place], [place] [address]    Report Status 06/24/2021 FINAL  Final  Blood culture (routine x 2)     Status: Abnormal   Collection Time: 06/21/21  8:51 AM   Specimen: Chest; Blood  Result Value Ref Range Status   Specimen Description   Final    CHEST BOTTLES DRAWN AEROBIC AND ANAEROBIC Performed at [facility], [address], [place], [place] [address]    Special Requests   Final    Blood Culture adequate volume Performed at [facility], [address] [place], [place] [address]    Culture  Setup Time   Final    GRAM POSITIVE COCCI IN BOTH AEROBIC AND ANAEROBIC BOTTLES Gram Stain Report Called to,Read Back By and Verified With: Brown,P '@2133'$  by Matthews,B 7.19.22 [facility]    Culture STREPTOCOCCUS GROUP G (A)  Final   Report Status 06/24/2021 FINAL  Final   Organism ID, Bacteria STREPTOCOCCUS GROUP G  Final      Susceptibility   Streptococcus group g - MIC*    CLINDAMYCIN <=0.25 SENSITIVE Sensitive     AMPICILLIN <=0.25 SENSITIVE Sensitive     ERYTHROMYCIN <=0.12 SENSITIVE Sensitive     VANCOMYCIN 0.5 SENSITIVE Sensitive     CEFTRIAXONE <=0.12 SENSITIVE Sensitive     LEVOFLOXACIN 0.5 SENSITIVE Sensitive     PENICILLIN Value in next row Sensitive      SENSITIVE0.06    * STREPTOCOCCUS GROUP G  Blood Culture ID Panel (Reflexed)     Status: Abnormal   Collection Time: 06/21/21  8:51 AM  Result Value Ref Range Status   Enterococcus faecalis NOT DETECTED NOT DETECTED Final   Enterococcus Faecium NOT DETECTED NOT DETECTED Final   Listeria monocytogenes NOT DETECTED NOT DETECTED Final   Staphylococcus species NOT DETECTED NOT DETECTED Final   Staphylococcus aureus (BCID) NOT DETECTED NOT DETECTED Final   Staphylococcus epidermidis NOT DETECTED NOT DETECTED Final   Staphylococcus lugdunensis NOT DETECTED NOT DETECTED Final   Streptococcus species DETECTED (A) NOT DETECTED Final    Comment:  Not Enterococcus species, Streptococcus agalactiae, Streptococcus pyogenes, or Streptococcus pneumoniae. CRITICAL RESULT CALLED TO, READ BACK BY AND VERIFIED WITH: P. BROWN RN, AT 0321 06/22/21 D. VANHOOK    Streptococcus agalactiae NOT DETECTED NOT DETECTED Final   Streptococcus pneumoniae NOT DETECTED NOT DETECTED Final   Streptococcus pyogenes NOT DETECTED NOT DETECTED Final   A.calcoaceticus-baumannii NOT DETECTED NOT DETECTED Final   Bacteroides fragilis NOT DETECTED NOT DETECTED Final   Enterobacterales NOT DETECTED NOT DETECTED Final   Enterobacter cloacae complex NOT DETECTED NOT DETECTED Final   Escherichia coli NOT DETECTED NOT DETECTED Final   Klebsiella aerogenes NOT DETECTED NOT DETECTED Final   Klebsiella oxytoca NOT DETECTED NOT DETECTED Final   Klebsiella pneumoniae NOT DETECTED NOT DETECTED Final   Proteus species NOT DETECTED NOT DETECTED Final   Salmonella species NOT DETECTED NOT DETECTED Final   Serratia marcescens NOT DETECTED NOT DETECTED Final   Haemophilus influenzae NOT DETECTED NOT DETECTED Final   Neisseria meningitidis NOT DETECTED NOT DETECTED Final   Pseudomonas aeruginosa NOT DETECTED NOT DETECTED Final   Stenotrophomonas maltophilia NOT DETECTED NOT DETECTED Final   Candida albicans NOT DETECTED NOT DETECTED Final   Candida auris NOT DETECTED NOT DETECTED Final   Candida glabrata NOT DETECTED NOT DETECTED Final   Candida krusei NOT DETECTED NOT DETECTED Final   Candida parapsilosis NOT DETECTED NOT DETECTED Final   Candida tropicalis NOT DETECTED NOT DETECTED Final   Cryptococcus neoformans/gattii NOT DETECTED NOT DETECTED Final    Comment: Performed at [facility], [address], [place],  [address]  Resp Panel by RT-PCR (Flu A&B, Covid) Nasopharyngeal Swab     Status: None   Collection Time: 06/21/21  8:52 AM   Specimen: Nasopharyngeal Swab; Nasopharyngeal(NP) swabs in vial transport medium  Result Value Ref Range Status   SARS  Coronavirus 2 by RT PCR NEGATIVE NEGATIVE Final    Comment: (NOTE) SARS-CoV-2 target nucleic acids are NOT DETECTED.  The SARS-CoV-2 RNA is generally detectable in upper respiratory specimens during the acute phase of infection. The lowest concentration of SARS-CoV-2 viral copies this assay can detect is 138 copies/mL. A negative result does not preclude SARS-Cov-2 infection and should not be used as the sole basis for treatment or other patient management decisions. A negative result may occur with  improper specimen collection/handling, submission of specimen other than nasopharyngeal swab, presence of viral mutation(s) within the areas targeted by this assay, and inadequate number of viral copies(<138 copies/mL). A negative result must be combined with clinical observations, patient history, and epidemiological information. The expected result is Negative.  Fact Sheet for Patients:  EntrepreneurPulse.com.au  Fact Sheet for Healthcare Providers:  IncredibleEmployment.be  This test is no t yet approved or cleared by the [place] FDA and  has been authorized for detection and/or diagnosis of SARS-CoV-2 by FDA under an Emergency Use Authorization (EUA). This EUA will remain  in effect (meaning this test can be used) for the duration of the COVID-19 declaration under Section 564(b)(1) of the Act, 21 U.S.C.section 360bbb-3(b)(1), unless the authorization is terminated  or revoked sooner.       Influenza A by PCR NEGATIVE NEGATIVE Final   Influenza B by PCR NEGATIVE NEGATIVE Final    Comment: (NOTE) The Xpert Xpress SARS-CoV-2/FLU/RSV plus assay is intended as an aid in the diagnosis of influenza from Nasopharyngeal swab specimens and should not be used as a sole basis for treatment. Nasal washings and aspirates are unacceptable for Xpert Xpress SARS-CoV-2/FLU/RSV testing.  Fact Sheet for  Patients: EntrepreneurPulse.com.au  Fact Sheet for Healthcare Providers: IncredibleEmployment.be  This test is not yet approved or cleared by the [place] FDA and has been authorized for detection and/or diagnosis of SARS-CoV-2 by FDA under an Emergency Use Authorization (EUA). This EUA will remain in effect (meaning this test can be used) for the duration of the COVID-19 declaration under Section 564(b)(1) of the Act, 21 U.S.C. section 360bbb-3(b)(1), unless the authorization is terminated or revoked.  Performed at [facility], [address], [place], [place] [address]   Blood culture (routine single)     Status: None (Preliminary result)   Collection Time: 06/23/21  8:40 AM   Specimen: Right Antecubital; Blood  Result Value Ref Range Status   Specimen Description   Final    RIGHT ANTECUBITAL BOTTLES DRAWN AEROBIC AND ANAEROBIC   Special Requests   Final    Blood Culture results may not be optimal due to an inadequate volume of blood received in culture bottles   Culture   Final    NO GROWTH 2 DAYS Performed at [facility], [address], [place],  [address]    Report Status PENDING  Incomplete     Radiology Studies: [place] EKG SITE RITE  Result Date: 06/24/2021 If Site Rite image not attached, placement could not be confirmed due to current cardiac rhythm.   Scheduled Meds:  exemestane  25 mg Oral Daily   gabapentin  300 mg Oral BID   lidocaine (PF)  15 mL Intradermal Once   loratadine  10 mg Oral Daily   metoprolol succinate  25 mg Oral Daily   rosuvastatin  5 mg Oral QHS   sertraline  50 mg Oral Daily   topiramate  25 mg Oral BID   venlafaxine XR  150 mg Oral Daily  Continuous Infusions:  cefTRIAXone (ROCEPHIN)  IV 2 g (06/25/21 1036)     LOS: 4 days   Time spent: 35 minutes  If 7PM-7AM, please contact night-coverage [email or web address] Password [email or web address] 06/25/2021, 11:14 AM    Gerlene Fee, MD  [facility] How to contact the [facility] Attending or Consulting provider [place] or covering provider during after hours [place], for this patient?  Check the care team in [facility] and look for a) attending/consulting [facility] provider listed and b) the [facility] team listed Log into [email or web address] and use [place]'s universal password to access. If you do not have the password, please contact the hospital operator. Locate the [facility] provider you are looking for under [facility] and page to a number that you can be directly reached. If you still have difficulty reaching the provider, please page the [facility] (Director on Call) for the Hospitalists listed on amion for assistance.

## 2021-06-26 MED ORDER — CEFAZOLIN SODIUM-DEXTROSE 2-4 GM/100ML-% IV SOLN
2.0000 g | INTRAVENOUS | Status: AC
  Administered 2021-06-27: 2 g via INTRAVENOUS

## 2021-06-26 NOTE — Progress Notes (Signed)
PROGRESS NOTE    Sharon Phillips  [id number] DOB: September 28, 1971 DOA: 06/21/2021 PCP: Patient, [name]    Brief Narrative:   Sharon Phillips is a 50 y.o. female with medical history significant for breast cancer-currently off chemotherapy and in remission from last follow up, hypertension, dyslipidemia, and depression who presented to the ED with complaints of left upper arm redness and pain. She states that the pain has been intense and restricting her movement. Denies any nausea, vomiting, fever, neck stiffness. The redness has expanded throughout her abdomen, but she was mostly unaware of the spread. Patient was tachycardic.   Of note she has a history of migraine and also presented with headache. Denies photophobia.   Workup in the ED showed normal WBC, platelet count 112,000. Left upper extremity US was performed and there was no sign of DVT. CT head was obtained to evaluate for CVA showed no acute findings. Concern was for hematogenous spread of cellulitis for which Unasyn was ordered by ED provider.   7/19 - She had complaint of lip edema, raising concern for angioedema and allergic reaction in setting of extensive cellulitis. She was on lisinopril so that was held. Serial exam showed stable and slight decrease in lip swelling with no involvement of tongue/airway. Patient did have a fever up to 103.3, tachycardia to 132 and BP 157/74 so metoprolol was given for rate control. Patient's fever was controlled with tylenol, fioricet and toradol.   7/20  AM Erythema spread throughout torso, and examination of the port shows like the infection was starting to spread from there. Patient reports that pain has improved and that she has more mobility with the arm.  Dr. Constance Haw removed the port 7/20 evening with no complications  [id number] - patient's cellulitis and erythema have improved with some clearing of erythema on arms. Blood culture from 7/19 grew Group G streptococcus. Will transition  antibiotics to ceftriaxone 2g IV q24h for easier dosing compared to mainstay treatment penicillin. Patient no longer has headache.   7/22 - lost IV access, spoke with Dr. Constance Haw planning for tunneled IJ cath on Monday.  Repeated blood cultures 7/21 no growth to date.   7/22 - Pt has peripheral IV, rash and skin pain improving. Pt agreeable to receiving tunneled IJ cath on Monday and home IV antibiotics to complete full 14 day course of therapy.   Assessment & Plan:   Principal Problem:   Cellulitis Active Problems:   Bacteremia   Infection due to Port-A-Cath   Cellulitis of left upper extremity   Fever, unspecified   Hypertension   History of breast cancer   High cholesterol   Immunocompromised (HCC)  Sepsis secondary to Streptococcus bacteremia  and cellulitis of trunk and left upper extremity - case discussed over phone with ID (Dr. Linus Salmons); appreciates recommendations -Currently nonpurulent, but quite extensive and painful -Left chest wall port with erythema radiating out from port and involves entire torso and down to left arm. No significant tenderness or fluctuant noted, no concern for abscess - Patient still has fever T max 102.9 overnight; broke with antipyretics.  -Per infectious disease, IV Ancef until sensitivity comes back, then can target with IV penicillin x 14 days. No need for TEE at this time to evaluate for endocarditis. - Port removed by Dr. Constance Haw (7/20). Dressing in placed - Repeated blood culture 7/21 no growth to date - continue rocephin 2g q24h as 3rd gen cephalosporin is a good substitute and will provide easier dosing (once vs four  times a day) - spoke with Dr. Constance Haw, planning tunneled cath placement on 7/25, with plans to DC home on IV antibiotics to complete full 14 day course.     Migraine headache - No acute complaint today -Continue home medications for pain management   Sinus tachycardia RESOLVED  -Monitor on telemetry -Likely related to  sepsis -Resumed home metoprolol   Thrombocytopenia - RESOLVED - Patient had a large bruise on right thigh,  - Avoid heparin agents - suspect this is secondary to sepsis  - outpatient hematology follow up   History of hypertension/dyslipidemia - stable  -Continue home medications   History of depression -Continue home medications  Pruritus  - this is from resolving severe cellulitis  - topical benadryl creme and oral hydroxyzine ordered as needed    DVT prophylaxis: SCDs Code Status: Full Family Communication: Spoke with son on phone 7/21  daughter at bedside 7/23 Disposition Plan: home after central line placed for home IV antibiotics   Consultants:  General surgery (Dr. Constance Haw) Infectious disease (over the phone Dr. Linus Salmons)   Procedures:  Central venous port removal by Dr. Constance Haw 06/22/21    Antimicrobials:  Antibiotics Given (last 72 hours)     Date/Time Action Medication Dose Rate   06/23/21 1432 New Bag/Given   ceFAZolin (ANCEF) IVPB 2g/100 mL premix 2 g 200 mL/hr   06/23/21 2103 New Bag/Given   ceFAZolin (ANCEF) IVPB 2g/100 mL premix 2 g 200 mL/hr   06/24/21 [id number] New Bag/Given   cefTRIAXone (ROCEPHIN) 2 g in sodium chloride 0.9 % 100 mL IVPB 2 g 200 mL/hr   06/25/21 1036 New Bag/Given  [medication not on floor]   cefTRIAXone (ROCEPHIN) 2 g in sodium chloride 0.9 % 100 mL IVPB 2 g 200 mL/hr   06/26/21 [id number] New Bag/Given   cefTRIAXone (ROCEPHIN) 2 g in sodium chloride 0.9 % 100 mL IVPB 2 g 200 mL/hr      Subjective: Pt reports that she is noticing much improvement in rash and irritation.  Pt reports that she is agreeable to tunneled cath placement for home IV antibiotics.   Objective: Vitals:   06/25/21 0545 06/25/21 1407 06/25/21 2127 06/26/21 0509  BP: 130/61 137/81 (!) 156/97 116/68  Pulse: 90 90 85 75  Resp: '18 20 19 16  '$ Temp: 98.2 F (36.8 C) 98 F (36.7 C) 97.9 F (36.6 C) 98.2 F (36.8 C)  TempSrc: Oral Oral Oral Oral  SpO2: 98% 99% 100% 98%   Weight:      Height:        Intake/Output Summary (Last 24 hours) at 06/26/2021 1151 Last data filed at 06/25/2021 1700 Gross per 24 hour  Intake 300 ml  Output --  Net 300 ml   Filed Weights   06/21/21 0717  Weight: 77.1 kg   Examination:  General exam: Appears calm and comfortable  Respiratory system: Clear to auscultation. Respiratory effort normal. Cardiovascular system: S1 & S2 heard. No JVD, murmurs, rubs, gallops or clicks. No pedal edema. Gastrointestinal system: Abdomen is nondistended, soft and nontender. No organomegaly or masses felt. Normal bowel sounds heard. Central nervous system: Alert and oriented. No focal neurological deficits. Extremities: Symmetric 5 x 5 power. Skin: Port site healing well, no drainage seen, erythematous rash nearly resolved on trunk and left upper extremity.   Psychiatry: Judgement and insight appear normal. Mood & affect appropriate.   Data Reviewed: I have personally reviewed following labs and imaging studies  CBC: Recent Labs  Lab 06/21/21 0852 06/22/21 0524  06/23/21 0456 06/24/21 0518 06/25/21 0454  WBC 5.8 4.2 4.4 5.2 4.5  NEUTROABS  --   --   --  3.3 2.6  HGB 14.3 12.1 11.4* 11.5* 11.2*  HCT 42.8 36.9 33.7* 35.4* 34.3*  MCV 94.7 96.3 94.9 97.8 96.1  PLT 112* 101* 100* 107* [id number]   Basic Metabolic Panel: Recent Labs  Lab 06/21/21 0852 06/22/21 0524 06/23/21 0456 06/24/21 0518 06/25/21 0454  NA 135 134* 135 141 142  K 3.6 3.7 3.3* 3.7 3.6  CL 106 108 109 116* 112*  CO2 22 21* 20* 18* 24  GLUCOSE 139* 119* 111* 95 100*  BUN '13 9 9 7 7  '$ CREATININE 0.87 0.79 0.74 0.55 0.62  CALCIUM 8.7* 8.1* 8.0* 8.1* 8.4*  MG  --  1.9 1.9 2.3 2.3   GFR: Estimated Creatinine Clearance: 86.5 mL/min (by C-G formula based on SCr of 0.62 mg/dL). Liver Function Tests: No results for input(s): AST, ALT, ALKPHOS, BILITOT, PROT, ALBUMIN in the last 168 hours. No results for input(s): LIPASE, AMYLASE in the last 168 hours. No results for  input(s): AMMONIA in the last 168 hours. Coagulation Profile: No results for input(s): INR, PROTIME in the last 168 hours. Cardiac Enzymes: No results for input(s): CKTOTAL, CKMB, CKMBINDEX, TROPONINI in the last 168 hours. BNP (last 3 results) No results for input(s): PROBNP in the last 8760 hours. HbA1C: No results for input(s): HGBA1C in the last 72 hours. CBG: No results for input(s): GLUCAP in the last 168 hours. Lipid Profile: No results for input(s): CHOL, HDL, LDLCALC, TRIG, CHOLHDL, LDLDIRECT in the last 72 hours. Thyroid Function Tests: No results for input(s): TSH, T4TOTAL, FREET4, T3FREE, THYROIDAB in the last 72 hours. Anemia Panel: No results for input(s): VITAMINB12, FOLATE, FERRITIN, TIBC, IRON, RETICCTPCT in the last 72 hours. Urine analysis: No results found for: COLORURINE, APPEARANCEUR, [place], [place], GLUCOSEU, [place], [place], [place], [place], [place], NITRITE, LEUKOCYTESUR  Recent Results (from the past 240 hour(s))  Blood culture (routine x 2)     Status: Abnormal   Collection Time: 06/21/21  7:33 AM   Specimen: Chest; Blood  Result Value Ref Range Status   Specimen Description   Final    CHEST BOTTLES DRAWN AEROBIC AND ANAEROBIC Performed at [facility], [address], [place], [place] [address]    Special Requests   Final    Blood Culture adequate volume Performed at [facility], [address], [place], [place] [address]    Culture  Setup Time   Final    GRAM POSITIVE COCCI IN BOTH AEROBIC AND ANAEROBIC BOTTLES Gram Stain Report Called to,Read Back By and Verified With: brown,p @ 2133 by matthews,b 7.19.22 [facility] Performed at [facility], [address], [place], [place] [address]    Culture (A)  Final    STREPTOCOCCUS GROUP G SUSCEPTIBILITIES PERFORMED ON PREVIOUS CULTURE WITHIN THE LAST 5 DAYS. Performed at [facility], [address], [place], [place] [address]    Report Status 06/24/2021 FINAL  Final  Blood  culture (routine x 2)     Status: Abnormal   Collection Time: 06/21/21  8:51 AM   Specimen: Chest; Blood  Result Value Ref Range Status   Specimen Description   Final    CHEST BOTTLES DRAWN AEROBIC AND ANAEROBIC Performed at [facility], [address], [place], [place] [address]    Special Requests   Final    Blood Culture adequate volume Performed at [facility], [address], [place],  [place] [address]    Culture  Setup Time   Final    GRAM POSITIVE COCCI IN BOTH AEROBIC AND ANAEROBIC BOTTLES Gram Stain Report Called to,Read Back By and Verified With: Brown,P '@2133'$  by Matthews,B 7.19.22 [facility]    Culture STREPTOCOCCUS GROUP G (A)  Final   Report Status 06/24/2021 FINAL  Final   Organism ID, Bacteria STREPTOCOCCUS GROUP G  Final      Susceptibility   Streptococcus group g - MIC*    CLINDAMYCIN <=0.25 SENSITIVE Sensitive     AMPICILLIN <=0.25 SENSITIVE Sensitive     ERYTHROMYCIN <=0.12 SENSITIVE Sensitive     VANCOMYCIN 0.5 SENSITIVE Sensitive     CEFTRIAXONE <=0.12 SENSITIVE Sensitive     LEVOFLOXACIN 0.5 SENSITIVE Sensitive     PENICILLIN Value in next row Sensitive      SENSITIVE0.06    * STREPTOCOCCUS GROUP G  Blood Culture ID Panel (Reflexed)     Status: Abnormal   Collection Time: 06/21/21  8:51 AM  Result Value Ref Range Status   Enterococcus faecalis NOT DETECTED NOT DETECTED Final   Enterococcus Faecium NOT DETECTED NOT DETECTED Final   Listeria monocytogenes NOT DETECTED NOT DETECTED Final   Staphylococcus species NOT DETECTED NOT DETECTED Final   Staphylococcus aureus (BCID) NOT DETECTED NOT DETECTED Final   Staphylococcus epidermidis NOT DETECTED NOT DETECTED Final   Staphylococcus lugdunensis NOT DETECTED NOT DETECTED Final   Streptococcus species DETECTED (A) NOT DETECTED Final    Comment: Not Enterococcus species, Streptococcus agalactiae, Streptococcus pyogenes, or Streptococcus pneumoniae. CRITICAL RESULT CALLED TO, READ BACK BY AND VERIFIED  WITH: P. BROWN RN, AT 0321 06/22/21 D. VANHOOK    Streptococcus agalactiae NOT DETECTED NOT DETECTED Final   Streptococcus pneumoniae NOT DETECTED NOT DETECTED Final   Streptococcus pyogenes NOT DETECTED NOT DETECTED Final   A.calcoaceticus-baumannii NOT DETECTED NOT DETECTED Final   Bacteroides fragilis NOT DETECTED NOT DETECTED Final   Enterobacterales NOT DETECTED NOT DETECTED Final   Enterobacter cloacae complex NOT DETECTED NOT DETECTED Final   Escherichia coli NOT DETECTED NOT DETECTED Final   Klebsiella aerogenes NOT DETECTED NOT DETECTED Final   Klebsiella oxytoca NOT DETECTED NOT DETECTED Final   Klebsiella pneumoniae NOT DETECTED NOT DETECTED Final   Proteus species NOT DETECTED NOT DETECTED Final   Salmonella species NOT DETECTED NOT DETECTED Final   Serratia marcescens NOT DETECTED NOT DETECTED Final   Haemophilus influenzae NOT DETECTED NOT DETECTED Final   Neisseria meningitidis NOT DETECTED NOT DETECTED Final   Pseudomonas aeruginosa NOT DETECTED NOT DETECTED Final   Stenotrophomonas maltophilia NOT DETECTED NOT DETECTED Final   Candida albicans NOT DETECTED NOT DETECTED Final   Candida auris NOT DETECTED NOT DETECTED Final   Candida glabrata NOT DETECTED NOT DETECTED Final   Candida krusei NOT DETECTED NOT DETECTED Final   Candida parapsilosis NOT DETECTED NOT DETECTED Final   Candida tropicalis NOT DETECTED NOT DETECTED Final   Cryptococcus neoformans/gattii NOT DETECTED NOT DETECTED Final    Comment: Performed at [facility], [address], [place], [place] [address]  Resp Panel by RT-PCR (Flu A&B, Covid) Nasopharyngeal Swab     Status: None   Collection Time: 06/21/21  8:52 AM   Specimen: Nasopharyngeal Swab; Nasopharyngeal(NP) swabs in vial transport medium  Result Value Ref Range Status   SARS Coronavirus 2 by RT PCR NEGATIVE NEGATIVE Final    Comment: (NOTE) SARS-CoV-2 target nucleic acids are NOT DETECTED.  The SARS-CoV-2 RNA is generally detectable  in  upper respiratory specimens during the acute phase of infection. The lowest concentration of SARS-CoV-2 viral copies this assay can detect is 138 copies/mL. A negative result does not preclude SARS-Cov-2 infection and should not be used as the sole basis for treatment or other patient management decisions. A negative result may occur with  improper specimen collection/handling, submission of specimen other than nasopharyngeal swab, presence of viral mutation(s) within the areas targeted by this assay, and inadequate number of viral copies(<138 copies/mL). A negative result must be combined with clinical observations, patient history, and epidemiological information. The expected result is Negative.  Fact Sheet for Patients:  EntrepreneurPulse.com.au  Fact Sheet for Healthcare Providers:  IncredibleEmployment.be  This test is no t yet approved or cleared by the [place] FDA and  has been authorized for detection and/or diagnosis of SARS-CoV-2 by FDA under an Emergency Use Authorization (EUA). This EUA will remain  in effect (meaning this test can be used) for the duration of the COVID-19 declaration under Section 564(b)(1) of the Act, 21 U.S.C.section 360bbb-3(b)(1), unless the authorization is terminated  or revoked sooner.       Influenza A by PCR NEGATIVE NEGATIVE Final   Influenza B by PCR NEGATIVE NEGATIVE Final    Comment: (NOTE) The Xpert Xpress SARS-CoV-2/FLU/RSV plus assay is intended as an aid in the diagnosis of influenza from Nasopharyngeal swab specimens and should not be used as a sole basis for treatment. Nasal washings and aspirates are unacceptable for Xpert Xpress SARS-CoV-2/FLU/RSV testing.  Fact Sheet for Patients: EntrepreneurPulse.com.au  Fact Sheet for Healthcare Providers: IncredibleEmployment.be  This test is not yet approved or cleared by the [place] FDA and has  been authorized for detection and/or diagnosis of SARS-CoV-2 by FDA under an Emergency Use Authorization (EUA). This EUA will remain in effect (meaning this test can be used) for the duration of the COVID-19 declaration under Section 564(b)(1) of the Act, 21 U.S.C. section 360bbb-3(b)(1), unless the authorization is terminated or revoked.  Performed at [facility], [address], [place], [place] [address]   Blood culture (routine single)     Status: None (Preliminary result)   Collection Time: 06/23/21  8:40 AM   Specimen: Right Antecubital; Blood  Result Value Ref Range Status   Specimen Description   Final    RIGHT ANTECUBITAL BOTTLES DRAWN AEROBIC AND ANAEROBIC   Special Requests   Final    Blood Culture results may not be optimal due to an inadequate volume of blood received in culture bottles   Culture   Final    NO GROWTH 2 DAYS Performed at [facility], [address], [place], [place] [address]    Report Status PENDING  Incomplete     Radiology Studies: No results found.  Scheduled Meds:  exemestane  25 mg Oral Daily   gabapentin  300 mg Oral BID   lidocaine (PF)  15 mL Intradermal Once   loratadine  10 mg Oral Daily   metoprolol succinate  25 mg Oral Daily   rosuvastatin  5 mg Oral QHS   sertraline  50 mg Oral Daily   topiramate  25 mg Oral BID   venlafaxine XR  150 mg Oral Daily   Continuous Infusions:  cefTRIAXone (ROCEPHIN)  IV 2 g (06/26/21 0923)     LOS: 5 days   Time spent: 35 minutes  If 7PM-7AM, please contact night-coverage [email or web address] Password [facility] 06/26/2021, 11:51 AM    Gerlene Fee, MD  [facility] How to contact the  [facility] Attending or Consulting provider [place] or covering provider during after hours [place], for this patient?  Check the care team in [facility] and look for a) attending/consulting [facility] provider listed and b) the [facility] team listed Log into [email or web address] and use [place]'s universal password to access. If you do not have  the password, please contact the hospital operator. Locate the [facility] provider you are looking for under [facility] and page to a number that you can be directly reached. If you still have difficulty reaching the provider, please page the [facility] (Director on Call) for the Hospitalists listed on amion for assistance.

## 2021-06-26 NOTE — Progress Notes (Signed)
[facility]  Discussed plan for tunneled central line tomorrow with the patient in the right IJ. Discussed risk of bleeding, infection, pneumothorax, and removal with lidocaine.   Discussed use of [place] and fluorsoscopy.   Orders placed.  Curlene Labrum, MD [facility] [address] [place], [place] [address] [phone] (office)

## 2021-06-26 NOTE — Plan of Care (Signed)
  Problem: Education: Goal: Knowledge of General Education information will improve Description Including pain rating scale, medication(s)/side effects and non-pharmacologic comfort measures Outcome: Progressing   Problem: Health Behavior/Discharge Planning: Goal: Ability to manage health-related needs will improve Outcome: Progressing   

## 2021-06-27 ENCOUNTER — Inpatient Hospital Stay (HOSPITAL_COMMUNITY): Payer: Medicare Other

## 2021-06-27 ENCOUNTER — Inpatient Hospital Stay (HOSPITAL_COMMUNITY): Payer: Medicare Other | Admitting: Anesthesiology

## 2021-06-27 ENCOUNTER — Encounter (HOSPITAL_COMMUNITY)
Admission: EM | Disposition: A | Discharge status: Home Health | Payer: Self-pay | Source: Home / Self Care | Attending: Family Medicine

## 2021-06-27 ENCOUNTER — Encounter (HOSPITAL_COMMUNITY): Payer: Self-pay | Admitting: Internal Medicine

## 2021-06-27 DIAGNOSIS — Z9013 Acquired absence of bilateral breasts and nipples: Secondary | ICD-10-CM | POA: Diagnosis not present

## 2021-06-27 DIAGNOSIS — R7881 Bacteremia: Secondary | ICD-10-CM | POA: Diagnosis not present

## 2021-06-27 DIAGNOSIS — L03114 Cellulitis of left upper limb: Secondary | ICD-10-CM | POA: Diagnosis not present

## 2021-06-27 HISTORY — PX: CENTRAL VENOUS CATHETER INSERTION: SHX401

## 2021-06-27 LAB — SURGICAL PCR SCREEN
MRSA, PCR: NEGATIVE
Staphylococcus aureus: NEGATIVE

## 2021-06-27 IMAGING — DX DG CHEST 1V PORT
1 series · 1 of 1 positions shown · non-contrast
Comparison: [DATE]

CLINICAL DATA: INTERNAL JUGULAR CATHETER PLACEMENT.

EXAM:
PORTABLE CHEST 1 VIEW

[chest ap]
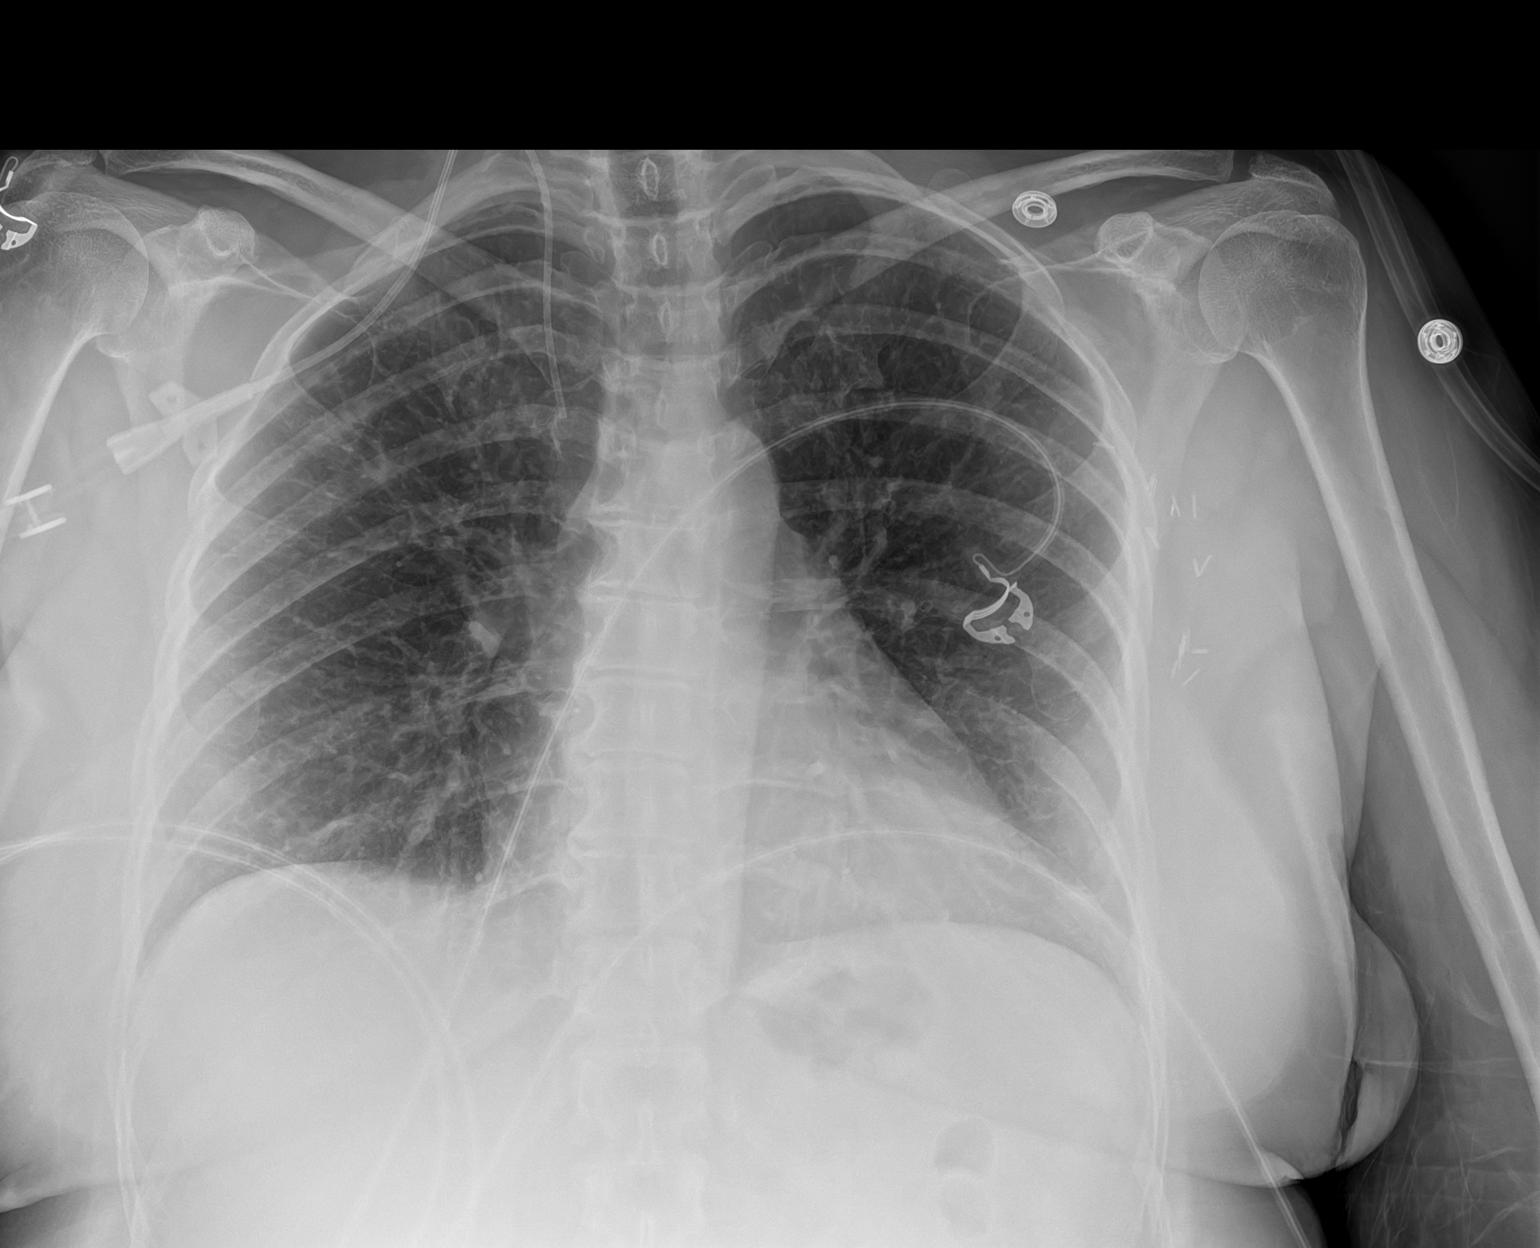

[1 of 1 positions shown; findings below may reference images not displayed]

FINDINGS: RIGHT INTERNAL JUGULAR CENTRAL LINE TIP IS IN THE SVC 1 CM ABOVE THE
AZYGOS LEVEL. NO PNEUMOTHORAX. THE LUNGS REMAIN CLEAR. SURGICAL
CLIPS IN THE LEFT AXILLA AS SEEN PREVIOUSLY.
IMPRESSION: Central line tip in the SVC 1 cm above the azygos level. No
pneumothorax.

## 2021-06-27 SURGERY — INSERTION, CATHETER, CENTRAL VENOUS, ADULT
Anesthesia: General | Site: Neck | Laterality: Right

## 2021-06-27 MED ORDER — CEFTRIAXONE IV (FOR PTA / DISCHARGE USE ONLY): 2.0000 g | INTRAVENOUS | 0 refills | Status: AC

## 2021-06-27 MED ORDER — FENTANYL CITRATE (PF) 100 MCG/2ML IJ SOLN
INTRAMUSCULAR | Status: DC | PRN
  Administered 2021-06-27: 25 ug via INTRAVENOUS

## 2021-06-27 MED ORDER — CEFAZOLIN SODIUM-DEXTROSE 2-4 GM/100ML-% IV SOLN
INTRAVENOUS | Status: AC
  Filled 2021-06-27: qty 100

## 2021-06-27 MED ORDER — PROPOFOL 500 MG/50ML IV EMUL
INTRAVENOUS | Status: DC | PRN
  Administered 2021-06-27: 125 ug/kg/min via INTRAVENOUS

## 2021-06-27 MED ORDER — FENTANYL CITRATE (PF) 100 MCG/2ML IJ SOLN: 25.0000 ug | INTRAMUSCULAR | Status: DC | PRN

## 2021-06-27 MED ORDER — FENTANYL CITRATE (PF) 100 MCG/2ML IJ SOLN
INTRAMUSCULAR | Status: AC
  Filled 2021-06-27: qty 2

## 2021-06-27 MED ORDER — SODIUM CHLORIDE 0.9 % IV SOLN
INTRAVENOUS | Status: AC | PRN
  Administered 2021-06-27: 500 mL via INTRAMUSCULAR

## 2021-06-27 MED ORDER — PROPOFOL 10 MG/ML IV BOLUS
INTRAVENOUS | Status: AC
  Filled 2021-06-27: qty 20

## 2021-06-27 MED ORDER — HEPARIN SOD (PORK) LOCK FLUSH 100 UNIT/ML IV SOLN
INTRAVENOUS | Status: AC
  Filled 2021-06-27: qty 5

## 2021-06-27 MED ORDER — LIDOCAINE HCL (PF) 2 % IJ SOLN
INTRAMUSCULAR | Status: AC
  Filled 2021-06-27: qty 5

## 2021-06-27 MED ORDER — LACTATED RINGERS IV SOLN: INTRAVENOUS | Status: DC

## 2021-06-27 MED ORDER — MIDAZOLAM HCL 2 MG/2ML IJ SOLN
INTRAMUSCULAR | Status: AC
  Filled 2021-06-27: qty 2

## 2021-06-27 MED ORDER — LIDOCAINE HCL (PF) 1 % IJ SOLN
INTRAMUSCULAR | Status: DC | PRN
  Administered 2021-06-27: 7 mL

## 2021-06-27 MED ORDER — MIDAZOLAM HCL 2 MG/2ML IJ SOLN
INTRAMUSCULAR | Status: DC | PRN
  Administered 2021-06-27: 2 mg via INTRAVENOUS

## 2021-06-27 MED ORDER — HEPARIN SOD (PORK) LOCK FLUSH 100 UNIT/ML IV SOLN
INTRAVENOUS | Status: DC | PRN
  Administered 2021-06-27: 400 [IU] via INTRAVENOUS

## 2021-06-27 MED ORDER — LIDOCAINE HCL (CARDIAC) PF 100 MG/5ML IV SOSY
PREFILLED_SYRINGE | INTRAVENOUS | Status: DC | PRN
  Administered 2021-06-27: 100 mg via INTRATRACHEAL

## 2021-06-27 MED ORDER — LIDOCAINE HCL (PF) 1 % IJ SOLN
INTRAMUSCULAR | Status: AC
  Filled 2021-06-27: qty 60

## 2021-06-27 MED ORDER — PROPOFOL 10 MG/ML IV BOLUS: INTRAVENOUS | Status: DC | PRN

## 2021-06-27 SURGICAL SUPPLY — 41 items
BAG DECANTER FOR FLEXI CONT (MISCELLANEOUS) ×1 IMPLANT
BIOPATCH RED 1 DISK 7.0 (GAUZE/BANDAGES/DRESSINGS) ×1 IMPLANT
BLADE 15 SAFETY STRL DISP (BLADE) ×1 IMPLANT
COVER LIGHT HANDLE STERIS (MISCELLANEOUS) ×2 IMPLANT
COVER PROBE U/S 5X48 (MISCELLANEOUS) ×2 IMPLANT
DERMABOND ADVANCED (GAUZE/BANDAGES/DRESSINGS) ×1
DERMABOND ADVANCED .7 DNX12 (GAUZE/BANDAGES/DRESSINGS) IMPLANT
DRAPE C-ARM FOLDED MOBILE STRL (DRAPES) ×1 IMPLANT
DRAPE CHEST BREAST 15X10 FENES (DRAPES) ×1 IMPLANT
DRSG SORBAVIEW 3.5X5-5/16 MED (GAUZE/BANDAGES/DRESSINGS) ×1 IMPLANT
DURAPREP 6ML APPLICATOR 50/CS (WOUND CARE) ×1 IMPLANT
ELECT REM PT RETURN 9FT ADLT (ELECTROSURGICAL) ×2
ELECTRODE REM PT RTRN 9FT ADLT (ELECTROSURGICAL) IMPLANT
GAUZE 4X4 16PLY ~~LOC~~+RFID DBL (SPONGE) ×1 IMPLANT
GEL ULTRASOUND 20GR AQUASONIC (MISCELLANEOUS) ×1 IMPLANT
GLOVE SURG ENC MOIS LTX SZ6.5 (GLOVE) ×2 IMPLANT
GLOVE SURG UNDER POLY LF SZ6.5 (GLOVE) ×1 IMPLANT
GLOVE SURG UNDER POLY LF SZ7 (GLOVE) ×1 IMPLANT
GOWN STRL REUS W/TWL LRG LVL3 (GOWN DISPOSABLE) ×2 IMPLANT
IV NS 500ML (IV SOLUTION) ×1
IV NS 500ML BAXH (IV SOLUTION) IMPLANT
KIT BLADEGUARD II DBL (SET/KITS/TRAYS/PACK) ×1 IMPLANT
KIT CVC 2 LUMEN POWERLINE 6FR (CATHETERS) IMPLANT
KIT DUAL LUMEN POWERLINE 6FR (CATHETERS) ×1
KIT TURNOVER KIT A (KITS) ×1 IMPLANT
MARKER SKIN DUAL TIP RULER LAB (MISCELLANEOUS) ×1 IMPLANT
NDL HYPO 18GX1.5 BLUNT FILL (NEEDLE) IMPLANT
NDL HYPO 25X1 1.5 SAFETY (NEEDLE) IMPLANT
NEEDLE HYPO 18GX1.5 BLUNT FILL (NEEDLE) ×2 IMPLANT
NEEDLE HYPO 25X1 1.5 SAFETY (NEEDLE) ×2 IMPLANT
PACK BASIC III (CUSTOM PROCEDURE TRAY) ×1
PACK SRG BSC III STRL LF ECLPS (CUSTOM PROCEDURE TRAY) IMPLANT
PAD ARMBOARD 7.5X6 YLW CONV (MISCELLANEOUS) ×2 IMPLANT
PENCIL SMOKE EVACUATOR COATED (MISCELLANEOUS) ×1 IMPLANT
SET BASIN LINEN APH (SET/KITS/TRAYS/PACK) ×1 IMPLANT
SUT MNCRL AB 4-0 PS2 18 (SUTURE) ×1 IMPLANT
SUT SILK 2 0 FSL 18 (SUTURE) ×1 IMPLANT
SUT VIC AB 3-0 SH 27 (SUTURE) ×1
SUT VIC AB 3-0 SH 27X BRD (SUTURE) IMPLANT
SYR 10ML LL (SYRINGE) ×2 IMPLANT
SYR CONTROL 10ML LL (SYRINGE) ×1 IMPLANT

## 2021-06-27 NOTE — Anesthesia Preprocedure Evaluation (Signed)
Anesthesia Evaluation  Patient identified by MRN, date of birth, ID band Patient awake    Reviewed: Allergy & Precautions, NPO status , Patient's Chart, lab work & pertinent test results, reviewed documented beta blocker date and time   Airway Mallampati: II  TM Distance: >3 FB Neck ROM: Full    Dental  (+) Dental Advisory Given, Teeth Intact   Pulmonary neg pulmonary ROS,    Pulmonary exam normal breath sounds clear to auscultation       Cardiovascular Exercise Tolerance: Good hypertension, Pt. on medications and Pt. on home beta blockers Normal cardiovascular exam Rhythm:Regular Rate:Normal     Neuro/Psych PSYCHIATRIC DISORDERS Anxiety Depression    GI/Hepatic negative GI ROS, Neg liver ROS,   Endo/Other  negative endocrine ROS  Renal/GU negative Renal ROS     Musculoskeletal negative musculoskeletal ROS (+)   Abdominal   Peds  Hematology negative hematology ROS (+)   Anesthesia Other Findings Immunocompromised  Left upper extremity cellulitis B/l Breast cancer, chemo/radiation Infected port cath  Reproductive/Obstetrics negative OB ROS                            Anesthesia Physical Anesthesia Plan  ASA: 3  Anesthesia Plan: General   Post-op Pain Management:    Induction: Intravenous  PONV Risk Score and Plan: Propofol infusion  Airway Management Planned: Nasal Cannula and Natural Airway  Additional Equipment:   Intra-op Plan:   Post-operative Plan:   Informed Consent: I have reviewed the patients History and Physical, chart, labs and discussed the procedure including the risks, benefits and alternatives for the proposed anesthesia with the patient or authorized representative who has indicated his/her understanding and acceptance.       Plan Discussed with: CRNA and Surgeon  Anesthesia Plan Comments:         Anesthesia Quick Evaluation

## 2021-06-27 NOTE — Progress Notes (Signed)
Pt assessed by Glenard Haring, RN in rm 327, no changes in assessment.

## 2021-06-27 NOTE — Transfer of Care (Signed)
Immediate Anesthesia Transfer of Care Note  Patient: Sharon Phillips  Procedure(s) Performed: INSERTION CENTRAL LINE ADULT, tunneled (Right: Neck)  Patient Location: PACU  Anesthesia Type:General  Level of Consciousness: awake, alert  and oriented  Airway & Oxygen Therapy: Patient Spontanous Breathing and Patient connected to nasal cannula oxygen  Post-op Assessment: Report given to RN and Post -op Vital signs reviewed and stable  Post vital signs: Reviewed and stable  Last Vitals:  Vitals Value Taken Time  BP    Temp    Pulse 82 06/27/21 1424  Resp 21 06/27/21 1424  SpO2 100 % 06/27/21 1424  Vitals shown include unvalidated device data.  Last Pain:  Vitals:   06/27/21 1130  TempSrc:   PainSc: 2       Patients Stated Pain Goal: 2 ([id number])  Complications: No notable events documented.

## 2021-06-27 NOTE — Discharge Instructions (Signed)
Please call Dr. Constance Haw office when it is time to have the central line removed after 07/06/21.     IMPORTANT INFORMATION: PAY CLOSE ATTENTION   PHYSICIAN DISCHARGE INSTRUCTIONS  Follow with Primary care provider  Patient, [name]  and other consultants as instructed by your Hospitalist Physician  [place] IF SYMPTOMS COME BACK, WORSEN OR NEW PROBLEM DEVELOPS   Please note: You were cared for by a hospitalist during your hospital stay. Every effort will be made to forward records to your primary care provider.  You can request that your primary care provider send for your hospital records if they have not received them.  Once you are discharged, your primary care physician will handle any further medical issues. Please note that NO REFILLS for any discharge medications will be authorized once you are discharged, as it is imperative that you return to your primary care physician (or establish a relationship with a primary care physician if you do not have one) for your post hospital discharge needs so that they can reassess your need for medications and monitor your lab values.  Please get a complete blood count and chemistry panel checked by your Primary MD at your next visit, and again as instructed by your Primary MD.  Get Medicines reviewed and adjusted: Please take all your medications with you for your next visit with your Primary MD  Laboratory/radiological data: Please request your Primary MD to go over all hospital tests and procedure/radiological results at the follow up, please ask your primary care provider to get all Hospital records sent to his/her office.  In some cases, they will be blood work, cultures and biopsy results pending at the time of your discharge. Please request that your primary care provider follow up on these results.  If you are diabetic, please bring your blood sugar readings with you to your follow up  appointment with primary care.    Please call and make your follow up appointments as soon as possible.    Also Note the following: If you experience worsening of your admission symptoms, develop shortness of breath, life threatening emergency, suicidal or homicidal thoughts you must seek medical attention immediately by calling 911 or calling your MD immediately  if symptoms less severe.  You must read complete instructions/literature along with all the possible adverse reactions/side effects for all the Medicines you take and that have been prescribed to you. Take any new Medicines after you have completely understood and accpet all the possible adverse reactions/side effects.   Do not drive when taking Pain medications or sleeping medications (Benzodiazepines)  Do not take more than prescribed Pain, Sleep and Anxiety Medications. It is not advisable to combine anxiety,sleep and pain medications without talking with your primary care practitioner  Special Instructions: If you have smoked or chewed Tobacco  in the last 2 yrs please stop smoking, stop any regular Alcohol  and or any Recreational drug use.  Wear Seat belts while driving.  Do not drive if taking any narcotic, mind altering or controlled substances or recreational drugs or alcohol.

## 2021-06-27 NOTE — Op Note (Signed)
Operative Note 06/27/21   Preoperative Diagnosis: Limited access, bilateral axillary dissection, bacteremia     Postoperative Diagnosis: Same   Procedure(s) Performed: Tunneled Dual Lumen 6 [place], Right Internal Jugular    Surgeon: Lanell Matar. Constance Haw, MD   Assistants: No qualified resident was available   Anesthesia: Monitored anesthesia care   Anesthesiologist: Denese Killings, MD    Specimens: None   Estimated Blood Loss: Minimal   Blood Replacement: None    Complications: None    Operative Findings: Normal anatomy  Indications: Ms. Oien is a 50 yo with a history of breast cancer, BRCA, who is s/p bilateral mastectomy and axillary dissection. She needs a tunneled central line for antibiotics for bacteremia and cellulitis. Her port was removed, we discussed tunneled central line placement on the right internal jugular and risk of bleeding, infection, pneumothorax, and injury to vessels.   Procedure: The patient was brought into the operating room and monitored anesthesia was induced.   The right chest and neck was prepped and draped in the usual sterile fashion.  Preoperative antibiotics were given.   An Ultrasound was used to verify that the right internal jugular vein was patent.  One percent lidocaine was used for local anesthesia.  The micropuncture needle was advanced into the right internal jugular vein using the Seldinger technique without difficulty.  A guidewire was then advanced into the right atrium under fluoroscopic guidance.  Ectopia was not noted.  The wire was secured.  An incision was made over the right chest and the catheter was tunneled to the neck.  The ultrasound again confirmed the wire was going into the vein only.  An introducer and peel-away sheath were placed over the guidewire. The 6 french power line dual lumen catheter was trimmed down to size at 17cm after I estimated the location of the SVC/ Atrial junction on fluorsoscopy.  The catheter was then inserted through the peel-away sheath and the peel-away sheath was removed.  A spot film was performed to confirm the position.  The catheter drew back and flushed easily on the purple lumen but was slightly more difficult on the red lumen. The lumens were flushed with heparin. Hemostats were used to position the catheter in the neck incision. The neck incision was closed with 4-0 Monocryl and Dermabond. The catheter was secured with 2-0 silk suture and a sterile Biopatch and dressing was applied.  Hemostasis was confirmed.     All tape and needle counts were correct at the end of the procedure. The patient was transferred to PACU in stable condition. A chest x-ray will be performed at that time.  Curlene Labrum, MD [facility] [address] [place], [place] [address] [phone] (office)

## 2021-06-27 NOTE — Care Management Important Message (Signed)
Important Message  Patient Details  Name: Sharon Phillips MRN: [id number] Date of Birth: 1971/08/13   Medicare Important Message Given:  Yes     Tommy Medal 06/27/2021, 11:50 AM

## 2021-06-27 NOTE — Plan of Care (Signed)

## 2021-06-27 NOTE — Progress Notes (Signed)
Patient dressing to removed left port site exchanged. Wet to dry gauze. Wound bed is nice and pink/red with no drainage or odor. Patient tolerated well.

## 2021-06-27 NOTE — Discharge Summary (Signed)
Physician Discharge Summary  Sharon Phillips FWY:[id number] DOB: Apr 13, 1971 DOA: 06/21/2021  PCP: Patient, [name]  Admit date: 06/21/2021 Discharge date: 06/27/2021  Admitted From: Disposition:   Recommendations for Outpatient Follow-up:  Follow up with PCP in 1 weeks for hospital follow up and recheck Follow up with Dr. Constance Haw after 8/3 when it is time to remove central line Establish care at [facility] for cancer surveillance Complete IV antibiotics thru 8/3 Follow up with [facility] for infectious disease in 2 weeks to follow up regarding bacteremia  Home Health:  RN for home IV antibiotic therapy  Discharge Condition: STABLE    CODE STATUS: FULL  DIET: heart healthy   Brief Hospitalization Summary: Please see all hospital notes, images, labs for full details of the hospitalization. ADMISSION HPI: Sharon Phillips is a 50 y.o. female with medical history significant for breast cancer-currently off chemotherapy, hypertension, dyslipidemia, and depression who presented to the ED with complaints of worsening migraine headaches in the last couple days.  She does have a history of migraine headaches and takes multiple home medications for this.  She has not had any nausea, vomiting, fever, or neck stiffness.  She does have some redness noted to her left arm and this has been associated with swelling.  The redness has also expanded throughout her chest wall, but she was mostly unaware of this.  She denies any injuries to her arms.  No photophobia noted.   ED Course: Vital signs demonstrate elevated heart rates in the ED and minimal temperature elevation.  She is noted to have no leukocytosis and platelet count of 112,000.  Left upper extremity ultrasound was performed and there is no sign of DVT.  CT head with no acute findings.  Concern was for severity of cellulitis for which Unasyn was ordered by EDP.  HOSPITAL COURSE BY PROBLEM   Brief Narrative:    Sharon Phillips is a 50 y.o. female with medical history significant for breast cancer-currently off chemotherapy and in remission from last follow up, hypertension, dyslipidemia, and depression who presented to the ED with complaints of left upper arm redness and pain. She states that the pain has been intense and restricting her movement. Denies any nausea, vomiting, fever, neck stiffness. The redness has expanded throughout her abdomen, but she was mostly unaware of the spread. Patient was tachycardic.   Of note she has a history of migraine and also presented with headache. Denies photophobia.   Workup in the ED showed normal WBC, platelet count 112,000. Left upper extremity US was performed and there was no sign of DVT. CT head was obtained to evaluate for CVA showed no acute findings. Concern was for hematogenous spread of cellulitis for which Unasyn was ordered by ED provider.   7/19 - She had complaint of lip edema, raising concern for angioedema and allergic reaction in setting of extensive cellulitis. She was on lisinopril so that was held. Serial exam showed stable and slight decrease in lip swelling with no involvement of tongue/airway. Patient did have a fever up to 103.3, tachycardia to 132 and BP 157/74 so metoprolol was given for rate control. Patient's fever was controlled with tylenol, fioricet and toradol.   7/20  AM Erythema spread throughout torso, and examination of the port shows like the infection was starting to spread from there. Patient reports that pain has improved and that she has more mobility with the arm.   Dr. Constance Haw removed the port 7/20 evening with no  complications   8/01 - patient's cellulitis and erythema have improved with some clearing of erythema on arms. Blood culture from 7/19 grew Group G streptococcus. Will transition antibiotics to ceftriaxone 2g IV q24h for easier dosing compared to mainstay treatment penicillin. Patient no longer has headache.   7/22 - lost IV  access, spoke with Dr. Constance Haw planning for tunneled IJ cath on Monday.  Repeated blood cultures 7/21 no growth to date.   7/22 - Pt has peripheral IV, rash and skin pain improving. Pt agreeable to receiving tunneled IJ cath on Monday and home IV antibiotics to complete full 14 day course of therapy.  7/25 - Pt had tunneled central line placed and discharge home to complete home IV antibiotics.    Assessment & Plan:   Principal Problem:   Cellulitis Active Problems:   Bacteremia   Infection due to Port-A-Cath   Cellulitis of left upper extremity   Fever, unspecified   Hypertension   History of breast cancer   High cholesterol   Immunocompromised (HCC)   Sepsis secondary to Streptococcus bacteremia  and cellulitis of trunk and left upper extremity - case discussed over phone with ID (Dr. Linus Salmons); appreciates recommendations -Currently nonpurulent, but quite extensive and painful -Left chest wall port with erythema radiating out from port and involves entire torso and down to left arm. No significant tenderness or fluctuant noted, no concern for abscess - Patient still has fever T max 102.9 overnight; broke with antipyretics. -Per infectious disease, IV Ancef until sensitivity comes back, then can target with IV penicillin x 14 days. No need for TEE at this time to evaluate for endocarditis. - Port removed by Dr. Constance Haw (7/20). Dressing in placed - Repeated blood culture 7/21 no growth to date - continue rocephin 2g q24h as 3rd gen cephalosporin is a good substitute and will provide easier dosing (once vs four times a day) - spoke with Dr. Constance Haw, planning tunneled cath placement on 7/25, with plans to DC home on IV antibiotics to complete full 14 day course.   -Pt had tunneled central line placement on 7/25 and can discharge home to complete full 14 day course of ceftriaxone thru 07/06/21.   - Follow up with PCP in 1 week.  - Follow up with Dr. Constance Haw after 8/3 when line needs to be  removed. - Follow up with [facility]    Migraine headache - No acute complaint today -Continue home medications for pain management   Sinus tachycardia RESOLVED -Monitor on telemetry -Likely related to sepsis -Resumed home metoprolol   Thrombocytopenia - RESOLVED - Patient had a large bruise on right thigh,  - Avoid heparin agents - suspect this is secondary to sepsis - outpatient hematology follow up   History of hypertension/dyslipidemia - stable -Continue home medications   History of depression -Continue home medications   Pruritus RESOLVED  - this is from resolving severe cellulitis - topical benadryl creme and oral hydroxyzine ordered as needed    DVT prophylaxis: SCDs Code Status: Full Family Communication: Spoke with son on phone 7/21  daughter at bedside 7/23 Disposition Plan: home after central line placed for home IV antibiotics   Consultants:  General surgery (Dr. Constance Haw) Infectious disease (over the phone Dr. Linus Salmons)   Procedures:  Central venous port removal by Dr. Constance Haw 06/22/21 Tunneled central line placement Dr. Constance Haw on 06/27/21     Discharge Diagnoses:  Principal Problem:   Cellulitis Active Problems:   Bacteremia   Infection due to Port-A-Cath  Cellulitis of left upper extremity   Fever, unspecified   Hypertension   History of breast cancer   High cholesterol   Immunocompromised (HCC)   History of bilateral mastectomy   Discharge Instructions: Discharge Instructions     [facility] pharmacist to adjust dose for Vancomycin, Aminoglycosides and other anti-infective therapies as requested by physician.   Complete by: As directed    [facility] to provide Cath Flo 35m   Complete by: As directed    Administer for PICC line occlusion and as ordered by physician for other access device issues.   Ambulatory referral to Hematology / Oncology   Complete by: As directed    Establish care relocated regarding history  of breast cancer   Anaphylaxis Kit: Provided to treat any anaphylactic reaction to the medication being provided to the patient if First Dose or when requested by physician   Complete by: As directed    Epinephrine 118mml vial / amp: Administer 0.22m69m0.22ml20mubcutaneously once for moderate to severe anaphylaxis, nurse to call physician and pharmacy when reaction occurs and call 911 if needed for immediate care   Diphenhydramine 50mg78mIV vial: Administer 25-50mg 65mM PRN for first dose reaction, rash, itching, mild reaction, nurse to call physician and pharmacy when reaction occurs   Sodium Chloride 0.9% NS 500ml I32mdminister if needed for hypovolemic blood pressure drop or as ordered by physician after call to physician with anaphylactic reaction   Change dressing on IV access line weekly and PRN   Complete by: As directed    Flush IV access with Sodium Chloride 0.9% and Heparin 10 units/ml or 100 units/ml   Complete by: As directed    Home infusion instructions - [facility]   Complete by: As directed    Instructions: Flush IV access with Sodium Chloride 0.9% and Heparin 10units/ml or 100units/ml   Change dressing on IV access line: Weekly and PRN   Instructions Cath Flo 2mg: Ad57mister for PICC Line occlusion and as ordered by physician for other access device   [facility] pharmacist to adjust dose for: Vancomycin, Aminoglycosides and other anti-infective therapies as requested by physician   Method of administration may be changed at the discretion of home infusion pharmacist based upon assessment of the patient and/or caregiver's ability to self-administer the medication ordered   Complete by: As directed    Outpatient Parenteral Antibiotic Therapy Information Antibiotic: Ceftriaxone (Rocephin) IVPB; Indications for use: bacteremia; End Date: 07/06/2021   Complete by: As directed    Antibiotic: Ceftriaxone (Rocephin) IVPB   Indications for use: bacteremia   End  Date: 07/06/2021      Allergies as of 06/27/2021       Reactions   Hibiclens [chlorhexidine Gluconate]         Medication List     TAKE these medications    acetaminophen 325 MG tablet Commonly known as: TYLENOL Take 650 mg by mouth every 6 (six) hours as needed for mild pain, moderate pain, fever or headache.   Ajovy 225 MG/1.5ML Soaj Generic drug: Fremanezumab-vfrm Inject into the skin every 30 (thirty) days.   Butalbital-Acetaminophen 50-300 MG Caps Take 1 capsule by mouth every 4 (four) hours as needed for headache.   cefTRIAXone  IVPB Commonly known as: ROCEPHIN Inject 2 g into the vein daily for 9 days. Indication:  Strep bacteremia First Dose: No Last Day of Therapy:  07/06/2021 Labs - Once weekly:  CBC/D and BMP, Labs - Every other week:  ESR and CRP Method of administration: IV Push Method of administration may be changed at the discretion of home infusion pharmacist based upon assessment of the patient and/or caregiver's ability to self-administer the medication ordered.   cetirizine 10 MG tablet Commonly known as: ZYRTEC Take 10 mg by mouth daily.   exemestane 25 MG tablet Commonly known as: AROMASIN Take 25 mg by mouth daily.   gabapentin 300 MG capsule Commonly known as: NEURONTIN Take 300 mg by mouth 2 (two) times daily.   lisinopril 10 MG tablet Commonly known as: ZESTRIL Take 10 mg by mouth 2 (two) times daily.   meclizine 12.5 MG tablet Commonly known as: ANTIVERT Take 12.5 mg by mouth daily as needed for dizziness.   metoprolol succinate 25 MG 24 hr tablet Commonly known as: TOPROL-XL Take 25 mg by mouth daily.   ondansetron 8 MG disintegrating tablet Commonly known as: ZOFRAN-ODT Take 8 mg by mouth every 8 (eight) hours as needed for nausea or vomiting.   QUEtiapine 50 MG tablet Commonly known as: SEROQUEL Take 50 mg by mouth daily as needed (mood).   rosuvastatin 5 MG tablet Commonly known as: CRESTOR Take 5 mg by mouth at  bedtime.   sertraline 50 MG tablet Commonly known as: ZOLOFT Take 50 mg by mouth daily.   SUMAtriptan 25 MG tablet Commonly known as: IMITREX Take 25 mg by mouth every 2 (two) hours as needed for migraine.   topiramate 25 MG tablet Commonly known as: TOPAMAX Take 25 mg by mouth 2 (two) times daily.   triamcinolone 0.025 % cream Commonly known as: KENALOG Apply 1 application topically 2 (two) times daily.   venlafaxine XR 150 MG 24 hr capsule Commonly known as: EFFEXOR-XR Take 150 mg by mouth daily.               Discharge Care Instructions  (From admission, onward)           Start     Ordered   06/27/21 0000  Change dressing on IV access line weekly and PRN  (Home infusion instructions - [facility] )        06/27/21 1443            Follow-up Information     Virl Cagey, MD Follow up.   Specialty: General Surgery Why: call the office once line needs to be removed Contact information: [address] [place] [facility] [address] [phone]         [facility]. Schedule an appointment as soon as possible for a visit in 2 week(s).   Specialty: Infectious Diseases Why: Hospital Follow Up for bacteremia / strep infection Contact information: [place], [place] [id number] [place] [phone]        Primary Care Provider. Schedule an appointment as soon as possible for a visit in 1 week(s).   Why: Hospital Follow Up        [facility]. Schedule an appointment as soon as possible for a visit in 1 month(s).   Specialty: Oncology Why: Establish care for history of breast cancer Contact information: [address] [id number] [place] [address] [phone]               Allergies  Allergen Reactions   Hibiclens [Chlorhexidine Gluconate]    Allergies as of 06/27/2021       Reactions   Hibiclens  [chlorhexidine Gluconate]  Medication List     TAKE these medications    acetaminophen 325 MG tablet Commonly known as: TYLENOL Take 650 mg by mouth every 6 (six) hours as needed for mild pain, moderate pain, fever or headache.   Ajovy 225 MG/1.5ML Soaj Generic drug: Fremanezumab-vfrm Inject into the skin every 30 (thirty) days.   Butalbital-Acetaminophen 50-300 MG Caps Take 1 capsule by mouth every 4 (four) hours as needed for headache.   cefTRIAXone  IVPB Commonly known as: ROCEPHIN Inject 2 g into the vein daily for 9 days. Indication:  Strep bacteremia First Dose: No Last Day of Therapy:  07/06/2021 Labs - Once weekly:  CBC/D and BMP, Labs - Every other week:  ESR and CRP Method of administration: IV Push Method of administration may be changed at the discretion of home infusion pharmacist based upon assessment of the patient and/or caregiver's ability to self-administer the medication ordered.   cetirizine 10 MG tablet Commonly known as: ZYRTEC Take 10 mg by mouth daily.   exemestane 25 MG tablet Commonly known as: AROMASIN Take 25 mg by mouth daily.   gabapentin 300 MG capsule Commonly known as: NEURONTIN Take 300 mg by mouth 2 (two) times daily.   lisinopril 10 MG tablet Commonly known as: ZESTRIL Take 10 mg by mouth 2 (two) times daily.   meclizine 12.5 MG tablet Commonly known as: ANTIVERT Take 12.5 mg by mouth daily as needed for dizziness.   metoprolol succinate 25 MG 24 hr tablet Commonly known as: TOPROL-XL Take 25 mg by mouth daily.   ondansetron 8 MG disintegrating tablet Commonly known as: ZOFRAN-ODT Take 8 mg by mouth every 8 (eight) hours as needed for nausea or vomiting.   QUEtiapine 50 MG tablet Commonly known as: SEROQUEL Take 50 mg by mouth daily as needed (mood).   rosuvastatin 5 MG tablet Commonly known as: CRESTOR Take 5 mg by mouth at bedtime.   sertraline 50 MG tablet Commonly known as: ZOLOFT Take 50 mg by mouth  daily.   SUMAtriptan 25 MG tablet Commonly known as: IMITREX Take 25 mg by mouth every 2 (two) hours as needed for migraine.   topiramate 25 MG tablet Commonly known as: TOPAMAX Take 25 mg by mouth 2 (two) times daily.   triamcinolone 0.025 % cream Commonly known as: KENALOG Apply 1 application topically 2 (two) times daily.   venlafaxine XR 150 MG 24 hr capsule Commonly known as: EFFEXOR-XR Take 150 mg by mouth daily.               Discharge Care Instructions  (From admission, onward)           Start     Ordered   06/27/21 0000  Change dressing on IV access line weekly and PRN  (Home infusion instructions - [facility] )        06/27/21 1443            Procedures/Studies: CT Head Wo Contrast  Result Date: 06/21/2021 CLINICAL DATA:  Headache. EXAM: CT HEAD WITHOUT CONTRAST TECHNIQUE: Contiguous axial images were obtained from the base of the skull through the vertex without intravenous contrast. COMPARISON:  None. FINDINGS: Brain: There is no evidence for acute hemorrhage, hydrocephalus, mass lesion, or abnormal extra-axial fluid collection. No definite CT evidence for acute infarction. Vascular: No hyperdense vessel or unexpected calcification. Skull: No evidence for fracture. No worrisome lytic or sclerotic lesion. Sinuses/Orbits: The visualized paranasal sinuses and mastoid air cells are clear. Visualized portions of the globes  and intraorbital fat are unremarkable. Other: None. IMPRESSION: Unremarkable study.  No acute intracranial abnormality. Electronically Signed   By: Misty Stanley M.D.   On: 06/21/2021 08:24   US Venous Img Upper Uni Left  Result Date: 06/21/2021 CLINICAL DATA:  Left upper extremity swelling for 3 days EXAM: LEFT UPPER EXTREMITY VENOUS DOPPLER ULTRASOUND TECHNIQUE: Gray-scale sonography with graded compression, as well as color Doppler and duplex ultrasound were performed to evaluate the upper extremity deep venous system from the  level of the subclavian vein and including the jugular, axillary, basilic, radial, ulnar and upper cephalic vein. Spectral Doppler was utilized to evaluate flow at rest and with distal augmentation maneuvers. COMPARISON:  None. FINDINGS: Contralateral Subclavian Vein: Respiratory phasicity is normal and symmetric with the symptomatic side. No evidence of thrombus. Normal compressibility. Internal Jugular Vein: No evidence of thrombus. Normal compressibility, respiratory phasicity and response to augmentation. Subclavian Vein: No evidence of thrombus. Normal compressibility, respiratory phasicity and response to augmentation. Axillary Vein: No evidence of thrombus. Normal compressibility, respiratory phasicity and response to augmentation. Cephalic Vein: No evidence of thrombus. Normal compressibility, respiratory phasicity and response to augmentation. Basilic Vein: No evidence of thrombus. Normal compressibility, respiratory phasicity and response to augmentation. Brachial Veins: No evidence of thrombus. Normal compressibility, respiratory phasicity and response to augmentation. Radial Veins: No evidence of thrombus. Normal compressibility, respiratory phasicity and response to augmentation. Ulnar Veins: No evidence of thrombus. Normal compressibility, respiratory phasicity and response to augmentation. IMPRESSION: No evidence of DVT within the left upper extremity. Electronically Signed   By: Jerilynn Mages.  Shick M.D.   On: 06/21/2021 10:15   DG Chest Port 1 View  Result Date: 06/22/2021 CLINICAL DATA:  Port-A-Cath verification. EXAM: PORTABLE CHEST 1 VIEW COMPARISON:  None. FINDINGS: Accessed left chest port in place. The tip is in the region of the upper SVC. No pneumothorax. Patient is mildly rotated. The heart is normal in size. Normal mediastinal contours. Minimal left basilar opacity felt to be a prominent epicardial fat pad. No confluent airspace disease. No pleural effusion. No pneumothorax. Surgical clips in the  left axilla. No acute osseous abnormalities are seen. IMPRESSION: Accessed left chest port in place with tip in the region of the upper SVC. No pneumothorax. Electronically Signed   By: Keith Rake M.D.   On: 06/22/2021 18:17   DG Chest Port 1V same Day  Result Date: 06/22/2021 CLINICAL DATA:  Port-A-Cath removed EXAM: PORTABLE CHEST 1 VIEW COMPARISON:  06/22/2021 FINDINGS: Removal of left-sided central venous port. Clips in the left axilla. No focal opacity or pleural effusion. Normal heart size. No pneumothorax IMPRESSION: No active disease.  Removal of left-sided central venous port Electronically Signed   By: Donavan Foil M.D.   On: 06/22/2021 21:48   DG C-Arm 1-60 Min-No Report  Result Date: 06/27/2021 Fluoroscopy was utilized by the requesting physician.  No radiographic interpretation.   [place] EKG SITE RITE  Result Date: 06/24/2021 If [facility] image not attached, placement could not be confirmed due to current cardiac rhythm.    Subjective: Pt without complaints.  Tolerated procedure well. She verbalized understanding to the discharge instructions.    Discharge Exam: Vitals:   06/27/21 1430 06/27/21 1445  BP: (!) 153/79 (!) 155/87  Pulse: (!) 3 69  Resp: (!) 22 18  Temp:    SpO2: 100% 98%   Vitals:   06/27/21 1130 06/27/21 1423 06/27/21 1430 06/27/21 1445  BP: (!) 164/86 (!) 148/83 (!) 153/79 (!) 155/87  Pulse: 88 82 (!)  3 69  Resp: 18 (!) 23 (!) 22 18  Temp: 97.8 F (36.6 C) (!) 97.5 F (36.4 C)    TempSrc:      SpO2: 98% 100% 100% 98%  Weight:      Height:        General exam: Appears calm and comfortable Respiratory system: Clear to auscultation. Respiratory effort normal. Cardiovascular system: S1 & S2 heard. No JVD, murmurs, rubs, gallops or clicks. No pedal edema. Gastrointestinal system: Abdomen is nondistended, soft and nontender. No organomegaly or masses felt. Normal bowel sounds heard. Central nervous system: Alert and oriented. No focal neurological  deficits. Extremities: Symmetric 5 x 5 power. Skin: Port site healing well, no drainage seen, erythematous rash nearly resolved on trunk and left upper extremity.   Psychiatry: Judgement and insight appear normal. Mood & affect appropriate.     The results of significant diagnostics from this hospitalization (including imaging, microbiology, ancillary and laboratory) are listed below for reference.     Microbiology: Recent Results (from the past 240 hour(s))  Blood culture (routine x 2)     Status: Abnormal   Collection Time: 06/21/21  7:33 AM   Specimen: Chest; Blood  Result Value Ref Range Status   Specimen Description   Final    CHEST BOTTLES DRAWN AEROBIC AND ANAEROBIC Performed at [facility], [address], [place], [place] [address]    Special Requests   Final    Blood Culture adequate volume Performed at [facility], [address] [place], [place] [address]    Culture  Setup Time   Final    GRAM POSITIVE COCCI IN BOTH AEROBIC AND ANAEROBIC BOTTLES Gram Stain Report Called to,Read Back By and Verified With: brown,p @ 2133 by matthews,b 7.19.22 [facility] Performed at [facility], [address] [place], [place] [address]    Culture (A)  Final    STREPTOCOCCUS GROUP G SUSCEPTIBILITIES PERFORMED ON PREVIOUS CULTURE WITHIN THE LAST 5 DAYS. Performed at [facility], [place] [address], [place], [place] [address]    Report Status 06/24/2021 FINAL  Final  Blood culture (routine x 2)     Status: Abnormal   Collection Time: 06/21/21  8:51 AM   Specimen: Chest; Blood  Result Value Ref Range Status   Specimen Description   Final    CHEST BOTTLES DRAWN AEROBIC AND ANAEROBIC Performed at [facility], [address] [place],  [address]    Special Requests   Final    Blood Culture adequate volume Performed at [facility], [address] [place],  [address]    Culture  Setup Time   Final    GRAM POSITIVE COCCI IN BOTH AEROBIC AND ANAEROBIC  BOTTLES Gram Stain Report Called to,Read Back By and Verified With: Brown,P _0  by Matthews,B 7.19.22 [facility]    Culture STREPTOCOCCUS GROUP G (A)  Final   Report Status 06/24/2021 FINAL  Final   Organism ID, Bacteria STREPTOCOCCUS GROUP G  Final      Susceptibility   Streptococcus group g - MIC*    CLINDAMYCIN <=0.25 SENSITIVE Sensitive     AMPICILLIN <=0.25 SENSITIVE Sensitive     ERYTHROMYCIN <=0.12 SENSITIVE Sensitive     VANCOMYCIN 0.5 SENSITIVE Sensitive     CEFTRIAXONE <=0.12 SENSITIVE Sensitive     LEVOFLOXACIN 0.5 SENSITIVE Sensitive     PENICILLIN Value in next row Sensitive      SENSITIVE0.06    * STREPTOCOCCUS GROUP G  Blood Culture ID  Panel (Reflexed)     Status: Abnormal   Collection Time: 06/21/21  8:51 AM  Result Value Ref Range Status   Enterococcus faecalis NOT DETECTED NOT DETECTED Final   Enterococcus Faecium NOT DETECTED NOT DETECTED Final   Listeria monocytogenes NOT DETECTED NOT DETECTED Final   Staphylococcus species NOT DETECTED NOT DETECTED Final   Staphylococcus aureus (BCID) NOT DETECTED NOT DETECTED Final   Staphylococcus epidermidis NOT DETECTED NOT DETECTED Final   Staphylococcus lugdunensis NOT DETECTED NOT DETECTED Final   Streptococcus species DETECTED (A) NOT DETECTED Final    Comment: Not Enterococcus species, Streptococcus agalactiae, Streptococcus pyogenes, or Streptococcus pneumoniae. CRITICAL RESULT CALLED TO, READ BACK BY AND VERIFIED WITH: P. BROWN RN, AT 0321 06/22/21 D. VANHOOK    Streptococcus agalactiae NOT DETECTED NOT DETECTED Final   Streptococcus pneumoniae NOT DETECTED NOT DETECTED Final   Streptococcus pyogenes NOT DETECTED NOT DETECTED Final   A.calcoaceticus-baumannii NOT DETECTED NOT DETECTED Final   Bacteroides fragilis NOT DETECTED NOT DETECTED Final   Enterobacterales NOT DETECTED NOT DETECTED Final   Enterobacter cloacae complex NOT DETECTED NOT DETECTED Final   Escherichia coli NOT DETECTED NOT DETECTED  Final   Klebsiella aerogenes NOT DETECTED NOT DETECTED Final   Klebsiella oxytoca NOT DETECTED NOT DETECTED Final   Klebsiella pneumoniae NOT DETECTED NOT DETECTED Final   Proteus species NOT DETECTED NOT DETECTED Final   Salmonella species NOT DETECTED NOT DETECTED Final   Serratia marcescens NOT DETECTED NOT DETECTED Final   Haemophilus influenzae NOT DETECTED NOT DETECTED Final   Neisseria meningitidis NOT DETECTED NOT DETECTED Final   Pseudomonas aeruginosa NOT DETECTED NOT DETECTED Final   Stenotrophomonas maltophilia NOT DETECTED NOT DETECTED Final   Candida albicans NOT DETECTED NOT DETECTED Final   Candida auris NOT DETECTED NOT DETECTED Final   Candida glabrata NOT DETECTED NOT DETECTED Final   Candida krusei NOT DETECTED NOT DETECTED Final   Candida parapsilosis NOT DETECTED NOT DETECTED Final   Candida tropicalis NOT DETECTED NOT DETECTED Final   Cryptococcus neoformans/gattii NOT DETECTED NOT DETECTED Final    Comment: Performed at [facility], [address] [place], [place] [address]  Resp Panel by RT-PCR (Flu A&B, Covid) Nasopharyngeal Swab     Status: None   Collection Time: 06/21/21  8:52 AM   Specimen: Nasopharyngeal Swab; Nasopharyngeal(NP) swabs in vial transport medium  Result Value Ref Range Status   SARS Coronavirus 2 by RT PCR NEGATIVE NEGATIVE Final    Comment: (NOTE) SARS-CoV-2 target nucleic acids are NOT DETECTED.  The SARS-CoV-2 RNA is generally detectable in upper respiratory specimens during the acute phase of infection. The lowest concentration of SARS-CoV-2 viral copies this assay can detect is 138 copies/mL. A negative result does not preclude SARS-Cov-2 infection and should not be used as the sole basis for treatment or other patient management decisions. A negative result may occur with  improper specimen collection/handling, submission of specimen other than nasopharyngeal swab, presence of viral mutation(s) within the areas targeted by  this assay, and inadequate number of viral copies(<138 copies/mL). A negative result must be combined with clinical observations, patient history, and epidemiological information. The expected result is Negative.  Fact Sheet for Patients:  EntrepreneurPulse.com.au  Fact Sheet for Healthcare Providers:  IncredibleEmployment.be  This test is no t yet approved or cleared by the [place] FDA and  has been authorized for detection and/or diagnosis of SARS-CoV-2 by FDA under an Emergency Use Authorization (EUA). This EUA will remain  in effect (  meaning this test can be used) for the duration of the COVID-19 declaration under Section 564(b)(1) of the Act, 21 U.S.C.section 360bbb-3(b)(1), unless the authorization is terminated  or revoked sooner.       Influenza A by PCR NEGATIVE NEGATIVE Final   Influenza B by PCR NEGATIVE NEGATIVE Final    Comment: (NOTE) The Xpert Xpress SARS-CoV-2/FLU/RSV plus assay is intended as an aid in the diagnosis of influenza from Nasopharyngeal swab specimens and should not be used as a sole basis for treatment. Nasal washings and aspirates are unacceptable for Xpert Xpress SARS-CoV-2/FLU/RSV testing.  Fact Sheet for Patients: EntrepreneurPulse.com.au  Fact Sheet for Healthcare Providers: IncredibleEmployment.be  This test is not yet approved or cleared by the [place] FDA and has been authorized for detection and/or diagnosis of SARS-CoV-2 by FDA under an Emergency Use Authorization (EUA). This EUA will remain in effect (meaning this test can be used) for the duration of the COVID-19 declaration under Section 564(b)(1) of the Act, 21 U.S.C. section 360bbb-3(b)(1), unless the authorization is terminated or revoked.  Performed at [facility], [address], [place], [place] [address]   Blood culture (routine single)     Status: None (Preliminary result)   Collection  Time: 06/23/21  8:40 AM   Specimen: Right Antecubital; Blood  Result Value Ref Range Status   Specimen Description   Final    RIGHT ANTECUBITAL BOTTLES DRAWN AEROBIC AND ANAEROBIC   Special Requests   Final    Blood Culture results may not be optimal due to an inadequate volume of blood received in culture bottles   Culture   Final    NO GROWTH 4 DAYS Performed at [facility], [address], [place], [place] [address]    Report Status PENDING  Incomplete  Surgical PCR screen     Status: None   Collection Time: 06/27/21  3:41 AM   Specimen: Nasal Mucosa; Nasal Swab  Result Value Ref Range Status   MRSA, PCR NEGATIVE NEGATIVE Final   Staphylococcus aureus NEGATIVE NEGATIVE Final    Comment: (NOTE) The Xpert SA Assay (FDA approved for NASAL specimens in patients 30 years of age and older), is one component of a comprehensive surveillance program. It is not intended to diagnose infection nor to guide or monitor treatment. Performed at [facility], [address], [place], [place] [address]      Labs: BNP (last 3 results) No results for input(s): BNP in the last 8760 hours. Basic Metabolic Panel: Recent Labs  Lab 06/21/21 0852 06/22/21 0524 06/23/21 0456 06/24/21 0518 06/25/21 0454  NA 135 134* 135 141 142  K 3.6 3.7 3.3* 3.7 3.6  CL 106 108 109 116* 112*  CO2 22 21* 20* 18* 24  GLUCOSE 139* 119* 111* 95 100*  BUN _0 CREATININE 0.87 0.79 0.74 0.55 0.62  CALCIUM 8.7* 8.1* 8.0* 8.1* 8.4*  MG  --  1.9 1.9 2.3 2.3   Liver Function Tests: No results for input(s): AST, ALT, ALKPHOS, BILITOT, PROT, ALBUMIN in the last 168 hours. No results for input(s): LIPASE, AMYLASE in the last 168 hours. No results for input(s): AMMONIA in the last 168 hours. CBC: Recent Labs  Lab 06/21/21 0852 06/22/21 0524 06/23/21 0456 06/24/21 0518 06/25/21 0454  WBC 5.8 4.2 4.4 5.2 4.5  NEUTROABS  --   --   --  3.3 2.6  HGB 14.3 12.1 11.4* 11.5* 11.2*  HCT 42.8 36.9 33.7* 35.4*  34.3*  MCV 94.7  96.3 94.9 97.8 96.1  PLT 112* 101* 100* 107* 151   Cardiac Enzymes: No results for input(s): CKTOTAL, CKMB, CKMBINDEX, TROPONINI in the last 168 hours. BNP: Invalid input(s): POCBNP CBG: No results for input(s): GLUCAP in the last 168 hours. D-Dimer No results for input(s): DDIMER in the last 72 hours. Hgb A1c No results for input(s): HGBA1C in the last 72 hours. Lipid Profile No results for input(s): CHOL, HDL, LDLCALC, TRIG, CHOLHDL, LDLDIRECT in the last 72 hours. Thyroid function studies No results for input(s): TSH, T4TOTAL, T3FREE, THYROIDAB in the last 72 hours.  Invalid input(s): FREET3 Anemia work up No results for input(s): VITAMINB12, FOLATE, FERRITIN, TIBC, IRON, RETICCTPCT in the last 72 hours. Urinalysis No results found for: COLORURINE, APPEARANCEUR, , [place], GLUCOSEU, [place], [place], [place], [place], UROBILINOGEN, NITRITE, LEUKOCYTESUR Sepsis Labs Invalid input(s): PROCALCITONIN,  WBC,  LACTICIDVEN Microbiology Recent Results (from the past 240 hour(s))  Blood culture (routine x 2)     Status: Abnormal   Collection Time: 06/21/21  7:33 AM   Specimen: Chest; Blood  Result Value Ref Range Status   Specimen Description   Final    CHEST BOTTLES DRAWN AEROBIC AND ANAEROBIC Performed at [facility], [address], [place], [place] [address]    Special Requests   Final    Blood Culture adequate volume Performed at [facility], [address], [place], [place] [address]    Culture  Setup Time   Final    GRAM POSITIVE COCCI IN BOTH AEROBIC AND ANAEROBIC BOTTLES Gram Stain Report Called to,Read Back By and Verified With: brown,p @ 2133 by matthews,b 7.19.22 [facility] Performed at [facility], [address], [place], [place] [address]    Culture (A)  Final    STREPTOCOCCUS GROUP G SUSCEPTIBILITIES PERFORMED ON PREVIOUS CULTURE WITHIN THE LAST 5 DAYS. Performed at [facility], [place] [address], [place], [place]  [address]    Report Status 06/24/2021 FINAL  Final  Blood culture (routine x 2)     Status: Abnormal   Collection Time: 06/21/21  8:51 AM   Specimen: Chest; Blood  Result Value Ref Range Status   Specimen Description   Final    CHEST BOTTLES DRAWN AEROBIC AND ANAEROBIC Performed at [facility], [address], [place], [place] [address]    Special Requests   Final    Blood Culture adequate volume Performed at [facility], [address], [place], [place] [address]    Culture  Setup Time   Final    GRAM POSITIVE COCCI IN BOTH AEROBIC AND ANAEROBIC BOTTLES Gram Stain Report Called to,Read Back By and Verified With: Brown,P _0  by Matthews,B 7.19.22 [facility]    Culture STREPTOCOCCUS GROUP G (A)  Final   Report Status 06/24/2021 FINAL  Final   Organism ID, Bacteria STREPTOCOCCUS GROUP G  Final      Susceptibility   Streptococcus group g - MIC*    CLINDAMYCIN <=0.25 SENSITIVE Sensitive     AMPICILLIN <=0.25 SENSITIVE Sensitive     ERYTHROMYCIN <=0.12 SENSITIVE Sensitive     VANCOMYCIN 0.5 SENSITIVE Sensitive     CEFTRIAXONE <=0.12 SENSITIVE Sensitive     LEVOFLOXACIN 0.5 SENSITIVE Sensitive     PENICILLIN Value in next row Sensitive      SENSITIVE0.06    * STREPTOCOCCUS GROUP G  Blood Culture ID Panel (Reflexed)     Status: Abnormal   Collection Time: 06/21/21  8:51 AM  Result Value Ref Range Status  Enterococcus faecalis NOT DETECTED NOT DETECTED Final   Enterococcus Faecium NOT DETECTED NOT DETECTED Final   Listeria monocytogenes NOT DETECTED NOT DETECTED Final   Staphylococcus species NOT DETECTED NOT DETECTED Final   Staphylococcus aureus (BCID) NOT DETECTED NOT DETECTED Final   Staphylococcus epidermidis NOT DETECTED NOT DETECTED Final   Staphylococcus lugdunensis NOT DETECTED NOT DETECTED Final   Streptococcus species DETECTED (A) NOT DETECTED Final    Comment: Not Enterococcus species, Streptococcus agalactiae, Streptococcus pyogenes, or Streptococcus  pneumoniae. CRITICAL RESULT CALLED TO, READ BACK BY AND VERIFIED WITH: P. BROWN RN, AT 0321 06/22/21 D. VANHOOK    Streptococcus agalactiae NOT DETECTED NOT DETECTED Final   Streptococcus pneumoniae NOT DETECTED NOT DETECTED Final   Streptococcus pyogenes NOT DETECTED NOT DETECTED Final   A.calcoaceticus-baumannii NOT DETECTED NOT DETECTED Final   Bacteroides fragilis NOT DETECTED NOT DETECTED Final   Enterobacterales NOT DETECTED NOT DETECTED Final   Enterobacter cloacae complex NOT DETECTED NOT DETECTED Final   Escherichia coli NOT DETECTED NOT DETECTED Final   Klebsiella aerogenes NOT DETECTED NOT DETECTED Final   Klebsiella oxytoca NOT DETECTED NOT DETECTED Final   Klebsiella pneumoniae NOT DETECTED NOT DETECTED Final   Proteus species NOT DETECTED NOT DETECTED Final   Salmonella species NOT DETECTED NOT DETECTED Final   Serratia marcescens NOT DETECTED NOT DETECTED Final   Haemophilus influenzae NOT DETECTED NOT DETECTED Final   Neisseria meningitidis NOT DETECTED NOT DETECTED Final   Pseudomonas aeruginosa NOT DETECTED NOT DETECTED Final   Stenotrophomonas maltophilia NOT DETECTED NOT DETECTED Final   Candida albicans NOT DETECTED NOT DETECTED Final   Candida auris NOT DETECTED NOT DETECTED Final   Candida glabrata NOT DETECTED NOT DETECTED Final   Candida krusei NOT DETECTED NOT DETECTED Final   Candida parapsilosis NOT DETECTED NOT DETECTED Final   Candida tropicalis NOT DETECTED NOT DETECTED Final   Cryptococcus neoformans/gattii NOT DETECTED NOT DETECTED Final    Comment: Performed at [facility], [address] [place], [place] [address]  Resp Panel by RT-PCR (Flu A&B, Covid) Nasopharyngeal Swab     Status: None   Collection Time: 06/21/21  8:52 AM   Specimen: Nasopharyngeal Swab; Nasopharyngeal(NP) swabs in vial transport medium  Result Value Ref Range Status   SARS Coronavirus 2 by RT PCR NEGATIVE NEGATIVE Final    Comment: (NOTE) SARS-CoV-2 target nucleic  acids are NOT DETECTED.  The SARS-CoV-2 RNA is generally detectable in upper respiratory specimens during the acute phase of infection. The lowest concentration of SARS-CoV-2 viral copies this assay can detect is 138 copies/mL. A negative result does not preclude SARS-Cov-2 infection and should not be used as the sole basis for treatment or other patient management decisions. A negative result may occur with  improper specimen collection/handling, submission of specimen other than nasopharyngeal swab, presence of viral mutation(s) within the areas targeted by this assay, and inadequate number of viral copies(<138 copies/mL). A negative result must be combined with clinical observations, patient history, and epidemiological information. The expected result is Negative.  Fact Sheet for Patients:  EntrepreneurPulse.com.au  Fact Sheet for Healthcare Providers:  IncredibleEmployment.be  This test is no t yet approved or cleared by the [place] FDA and  has been authorized for detection and/or diagnosis of SARS-CoV-2 by FDA under an Emergency Use Authorization (EUA). This EUA will remain  in effect (meaning this test can be used) for the duration of the COVID-19 declaration under Section 564(b)(1) of the Act, 21 U.S.C.section 360bbb-3(b)(1), unless the  authorization is terminated  or revoked sooner.       Influenza A by PCR NEGATIVE NEGATIVE Final   Influenza B by PCR NEGATIVE NEGATIVE Final    Comment: (NOTE) The Xpert Xpress SARS-CoV-2/FLU/RSV plus assay is intended as an aid in the diagnosis of influenza from Nasopharyngeal swab specimens and should not be used as a sole basis for treatment. Nasal washings and aspirates are unacceptable for Xpert Xpress SARS-CoV-2/FLU/RSV testing.  Fact Sheet for Patients: EntrepreneurPulse.com.au  Fact Sheet for Healthcare Providers: IncredibleEmployment.be  This  test is not yet approved or cleared by the [place] FDA and has been authorized for detection and/or diagnosis of SARS-CoV-2 by FDA under an Emergency Use Authorization (EUA). This EUA will remain in effect (meaning this test can be used) for the duration of the COVID-19 declaration under Section 564(b)(1) of the Act, 21 U.S.C. section 360bbb-3(b)(1), unless the authorization is terminated or revoked.  Performed at [facility], [address], [place], [place] [address]   Blood culture (routine single)     Status: None (Preliminary result)   Collection Time: 06/23/21  8:40 AM   Specimen: Right Antecubital; Blood  Result Value Ref Range Status   Specimen Description   Final    RIGHT ANTECUBITAL BOTTLES DRAWN AEROBIC AND ANAEROBIC   Special Requests   Final    Blood Culture results may not be optimal due to an inadequate volume of blood received in culture bottles   Culture   Final    NO GROWTH 4 DAYS Performed at [facility], [address], [place], [place] [address]    Report Status PENDING  Incomplete  Surgical PCR screen     Status: None   Collection Time: 06/27/21  3:41 AM   Specimen: Nasal Mucosa; Nasal Swab  Result Value Ref Range Status   MRSA, PCR NEGATIVE NEGATIVE Final   Staphylococcus aureus NEGATIVE NEGATIVE Final    Comment: (NOTE) The Xpert SA Assay (FDA approved for NASAL specimens in patients 36 years of age and older), is one component of a comprehensive surveillance program. It is not intended to diagnose infection nor to guide or monitor treatment. Performed at [facility], [address], [place], [place] [address]     Time coordinating discharge: 36 minutes   SIGNED:  Irwin Brakeman, MD  [facility] 06/27/2021, 2:55 PM How to contact the [facility] Attending or Consulting provider [place] or covering provider during after hours [place], for this patient?  Check the care team in [facility] and look for a) attending/consulting [facility] provider listed and b)  the [facility] team listed Log into [email or web address] and use [place]'s universal password to access. If you do not have the password, please contact the hospital operator. Locate the [facility] provider you are looking for under [facility] and page to a number that you can be directly reached. If you still have difficulty reaching the provider, please page the [facility] (Director on Call) for the Hospitalists listed on amion for assistance.

## 2021-06-27 NOTE — Clinical Social Work Note (Signed)
Patient referred to [facility] via [facility] for [facility]. [facility] accepting of referral.    Lavern Maslow, Clydene Pugh, LCSW

## 2021-06-27 NOTE — Progress Notes (Signed)
PHARMACY CONSULT NOTE FOR:  OUTPATIENT  PARENTERAL ANTIBIOTIC THERAPY (OPAT)  Indication: Strep Bacteremia Regimen: ceftriaxone 2gm IV q24h End date: 07/06/2021  IV antibiotic discharge orders are pended. To discharging provider:  please sign these orders via discharge navigator,  Select New Orders & click on the button choice - Manage This Unsigned Work.     Thank you for allowing pharmacy to be a part of this patient's care.  Isac Sarna, BS [place], BCPS Clinical Pharmacist Pager [phone] 06/27/2021, 10:15 AM

## 2021-06-27 NOTE — Progress Notes (Signed)
[facility]  CXR with CVL in the SVC, no PTX. Purple lumen worked better with flushing and pulling back.  Curlene Labrum, MD [facility] [address] [place], [place] [address] [phone] (office)

## 2021-06-27 NOTE — Progress Notes (Signed)
Patient up at the sink doing a bath. No complaints of pain. Eager for procedure today. Has been NPO.

## 2021-06-27 NOTE — Progress Notes (Signed)
Patient given extra wound care supplies for home until home health can see patient. Iv removed from air, central line intact and patent. Patient instructions discussed.

## 2021-06-27 NOTE — Anesthesia Postprocedure Evaluation (Signed)
Anesthesia Post Note  Patient: Nadelyn Chrisp  Procedure(s) Performed: INSERTION CENTRAL LINE ADULT, tunneled (Right: Neck)  Patient location during evaluation: PACU Anesthesia Type: General Level of consciousness: awake and alert and oriented Pain management: pain level controlled Vital Signs Assessment: post-procedure vital signs reviewed and stable Respiratory status: spontaneous breathing and respiratory function stable Cardiovascular status: stable and blood pressure returned to baseline Postop Assessment: no apparent nausea or vomiting Anesthetic complications: no   No notable events documented.   Last Vitals:  Vitals:   06/27/21 1445 06/27/21 1500  BP: (!) 155/87 (!) 157/78  Pulse: 69 64  Resp: 18 14  Temp:    SpO2: 98% 96%    Last Pain:  Vitals:   06/27/21 1500  TempSrc:   PainSc: 0-No pain                 Romond Pipkins C Densel Kronick

## 2021-06-27 NOTE — Interval H&P Note (Signed)
History and Physical Interval Note:  06/27/2021 12:43 PM  Sharon Phillips  has presented today for surgery, with the diagnosis of bacteremia, axillary dissection bilateral, needs longterm access.  The various methods of treatment have been discussed with the patient and family. After consideration of risks, benefits and other options for treatment, the patient has consented to  Procedure(s) with comments: INSERTION CENTRAL LINE ADULT, tunneled (N/A) - tunneled central line will use powerpicc and cut to size, need fluoro) as a surgical intervention.  The patient's history has been reviewed, patient examined, no change in status, stable for surgery.  I have reviewed the patient's chart and labs.  Questions were answered to the patient's satisfaction.   Tunneled RIJ to be placed for access   Virl Cagey

## 2021-06-28 ENCOUNTER — Encounter (HOSPITAL_COMMUNITY): Payer: Self-pay | Admitting: General Surgery

## 2021-06-28 LAB — CULTURE, BLOOD (SINGLE): Culture: NO GROWTH

## 2021-06-30 ENCOUNTER — Telehealth: Payer: Self-pay

## 2021-06-30 ENCOUNTER — Other Ambulatory Visit: Payer: Self-pay | Admitting: Family Medicine

## 2021-06-30 DIAGNOSIS — Z9013 Acquired absence of bilateral breasts and nipples: Secondary | ICD-10-CM

## 2021-06-30 DIAGNOSIS — Z853 Personal history of malignant neoplasm of breast: Secondary | ICD-10-CM

## 2021-06-30 NOTE — Telephone Encounter (Signed)
-----   Message from Thayer Headings, MD sent at 06/30/2021  1:11 PM EDT ----- Vague recollection.  That is fine, she would be a new patient, 30 min ----- Message ----- From: Beryle Flock, RN Sent: 06/30/2021  12:02 PM EDT To: Thayer Headings, MD  [facility] from [facility] called to get patient scheduled. I see on her discharge summary the hospitalist wanted her to follow up with [place] and did a phone consult with you, just didn't see any ID notes. Do you want me to put her on your schedule in 2 weeks? Thanks

## 2021-06-30 NOTE — Telephone Encounter (Signed)
Patient scheduled for follow up 07/13/21 with Dr. Linus Salmons. Relayed appointment time to [facility] with [place].   Beryle Flock, RN

## 2021-07-12 ENCOUNTER — Other Ambulatory Visit: Payer: Self-pay

## 2021-07-12 ENCOUNTER — Ambulatory Visit (INDEPENDENT_AMBULATORY_CARE_PROVIDER_SITE_OTHER): Payer: Medicare Other | Admitting: General Surgery

## 2021-07-12 ENCOUNTER — Encounter: Payer: Self-pay | Admitting: General Surgery

## 2021-07-12 VITALS — BP 157/95 | HR 91 | Temp 98.2°F | Resp 16 | Ht 64.0 in | Wt 178.4 lb

## 2021-07-12 DIAGNOSIS — L0390 Cellulitis, unspecified: Secondary | ICD-10-CM

## 2021-07-12 DIAGNOSIS — Z09 Encounter for follow-up examination after completed treatment for conditions other than malignant neoplasm: Secondary | ICD-10-CM

## 2021-07-13 ENCOUNTER — Other Ambulatory Visit: Payer: Self-pay

## 2021-07-13 ENCOUNTER — Encounter: Payer: Self-pay | Admitting: Internal Medicine

## 2021-07-13 ENCOUNTER — Ambulatory Visit (INDEPENDENT_AMBULATORY_CARE_PROVIDER_SITE_OTHER): Payer: Medicare Other | Admitting: Internal Medicine

## 2021-07-13 VITALS — BP 160/91 | HR 107 | Temp 97.9°F | Wt 178.8 lb

## 2021-07-13 DIAGNOSIS — L03114 Cellulitis of left upper limb: Secondary | ICD-10-CM | POA: Diagnosis not present

## 2021-07-13 DIAGNOSIS — T80219A Unspecified infection due to central venous catheter, initial encounter: Secondary | ICD-10-CM | POA: Diagnosis not present

## 2021-07-13 DIAGNOSIS — R7881 Bacteremia: Secondary | ICD-10-CM | POA: Diagnosis not present

## 2021-07-13 NOTE — Progress Notes (Signed)
Subjective:     Sharon Phillips  Patient presents for central line removal.  She has completed her course of IV antibiotics. Objective:    BP (!) 157/95   Pulse 91   Temp 98.2 F (36.8 C) (Oral)   Resp 16   Ht '5\' 4"'$  (1.626 m)   Wt 178 lb 6.4 oz (80.9 kg)   SpO2 95%   BMI 30.62 kg/m   General:  alert, cooperative, and no distress  Right IJ tunneled central line removed in total without difficulty.  [place]A-Cath site is healing well with granulation tissue present at the base.  Her left arm cellulitis seems to be resolving.     Assessment:    Central line removed without difficulty.    Plan:   Patient is scheduled to return to see Dr. Constance Haw in a few weeks.  Follow-up with oncology and ID as scheduled.

## 2021-07-13 NOTE — Progress Notes (Signed)
[facility]      Reason for Consult: bacteremia    Referring Physician: Dr. Wynetta Emery    Patient ID: Sharon Phillips, female    DOB: [birth year], 50 y.o.   MRN: [id number]  HPI:   She is here for evaluation after a recent hospitalization for bacteremia and cellulitis.  She has a history of breast cancer with a port-a-cath in place and completed chemotherapy and radiation therapy in [place].  She moved to [place] and has not established with any providers.  She developed chest wall and left arm cellulitis that appeared to start from the port site and spread from there.  Blood cultures were positive for Streptococcus group G and she was treated for 2 weeks with IV ceftriaxone by my recommendation.   Port was removed and a tunneled catheter placed, which was removed by Dr. Arnoldo Morale yesterday.  Her erythema resolved.   She is here with her daughter.   Past Medical History:  Diagnosis Date   Anxiety    Cancer ([place])    Depression    High cholesterol    Hypertension     Prior to Admission medications   Medication Sig Start Date End Date Taking? Authorizing Provider  acetaminophen (TYLENOL) 325 MG tablet Take 650 mg by mouth every 6 (six) hours as needed for mild pain, moderate pain, fever or headache.   Yes [provider]  AJOVY 225 MG/1.5ML SOAJ Inject into the skin every 30 (thirty) days. 06/08/21  Yes [provider]  betamethasone valerate (VALISONE) 0.1 % cream Apply topically 2 (two) times daily.   Yes [provider]  Butalbital-Acetaminophen 50-300 MG CAPS Take 1 capsule by mouth every 4 (four) hours as needed for headache. 02/11/20  Yes [provider]  cetirizine (ZYRTEC) 10 MG tablet Take 10 mg by mouth daily. 04/13/21  Yes [provider]  clonazePAM (KLONOPIN) 0.5 MG tablet Take 0.5 mg by mouth 2 (two) times daily as needed for anxiety.   Yes [provider]  exemestane (AROMASIN) 25 MG tablet Take 25 mg by  mouth daily. 05/30/21  Yes [provider]  fluticasone (FLONASE) 50 MCG/ACT nasal spray Place into both nostrils daily.   Yes [provider]  gabapentin (NEURONTIN) 300 MG capsule Take 300 mg by mouth 2 (two) times daily. 04/13/21  Yes [provider]  lisinopril (ZESTRIL) 10 MG tablet Take 10 mg by mouth 2 (two) times daily. 06/07/21  Yes [provider]  meclizine (ANTIVERT) 12.5 MG tablet Take 12.5 mg by mouth daily as needed for dizziness.   Yes [provider]  metoprolol succinate (TOPROL-XL) 25 MG 24 hr tablet Take 25 mg by mouth daily. 04/13/21  Yes [provider]  Multiple Vitamin (MULTIVITAMIN) capsule Take 1 capsule by mouth daily.   Yes [provider]  ondansetron (ZOFRAN-ODT) 8 MG disintegrating tablet Take 8 mg by mouth every 8 (eight) hours as needed for nausea or vomiting. 05/04/21  Yes [provider]  prochlorperazine (COMPAZINE) 10 MG tablet Take 10 mg by mouth every 6 (six) hours as needed for nausea or vomiting.   Yes [provider]  QUEtiapine (SEROQUEL) 50 MG tablet Take 50 mg by mouth daily as needed (mood). 05/30/21  Yes [provider]  rosuvastatin (CRESTOR) 5 MG tablet Take 5 mg by mouth at bedtime. 03/02/21  Yes [provider]  sertraline (ZOLOFT) 50 MG tablet Take 50 mg by mouth daily.   Yes [provider]  SUMAtriptan (IMITREX) 25 MG tablet Take 25 mg by mouth every 2 (two) hours as needed for migraine. 08/20/20  Yes [provider]  topiramate (TOPAMAX) 25 MG tablet Take 25 mg by mouth 2 (two) times daily. 05/03/21  Yes [provider]  triamcinolone (KENALOG) 0.025 % cream Apply 1 application topically 2 (two) times daily. 04/13/21  Yes [provider]  venlafaxine XR (EFFEXOR-XR) 150 MG 24 hr capsule Take 150 mg by mouth daily. 04/18/21  Yes [provider]  vitamin E 180 MG (400 UNITS) capsule Take 400 Units by mouth daily.   Yes  [provider]    Allergies  Allergen Reactions   Hibiclens [Chlorhexidine Gluconate]     Social History   Tobacco Use   Smoking status: Never   Smokeless tobacco: Never  Substance Use Topics   Alcohol use: Never   Drug use: Never   [place]; + cardiac disease  Review of Systems  Constitutional: negative for fevers, chills, and malaise Gastrointestinal: negative for nausea and diarrhea Musculoskeletal: negative for myalgias and arthralgias All other systems reviewed and are negative    Constitutional: in no apparent distress There were no vitals filed for this visit. EYES: anicteric ENMT: no thrush Cardiovascular: Cor RRR Respiratory: clear Musculoskeletal: no pedal edema noted Skin: left arm with no erythema, some dry skin, no swelling; left former port area with some denuded skin, no erythema  Labs: Lab Results  Component Value Date   WBC 4.5 06/25/2021   HGB 11.2 (L) 06/25/2021   HCT 34.3 (L) 06/25/2021   MCV 96.1 06/25/2021   PLT 151 06/25/2021    Lab Results  Component Value Date   CREATININE 0.62 06/25/2021   BUN 7 06/25/2021   NA 142 06/25/2021   K 3.6 06/25/2021   CL 112 (H) 06/25/2021   CO2 24 06/25/2021    Lab Results  Component Value Date   ALT 80 (H) 12/05/2020   AST 58 (H) 12/05/2020   ALKPHOS 129 (H) 12/05/2020   BILITOT 0.6 12/05/2020     Assessment: bacteremia with cellulitis, resolved.  I discussed the results with her, the negative follow up blood cultures and expected findings of her skin changes after an infection.  No further antibiotics indicated.    Plan: 1)  follow up with oncology Follow up here as needed.

## 2021-07-21 ENCOUNTER — Encounter: Payer: Self-pay | Admitting: Internal Medicine

## 2021-07-29 ENCOUNTER — Encounter (HOSPITAL_COMMUNITY): Payer: Self-pay

## 2021-07-29 NOTE — Progress Notes (Signed)
Call placed to the patient today. I introduced myself and explained my role in the patient's care. I provided my contact information and encouraged the patient to call with questions or concerns. I will plan to meet with the patient during initial visit with Dr. Delton Coombes on Monday. Patient appreciative of my call.

## 2021-07-31 NOTE — Progress Notes (Signed)
[facility] [address], [place] [address]   Patient Care Team: Patient, [name] as PCP - General (General Practice) Derek Jack, MD as Medical Oncologist (Oncology) Brien Mates, RN as Oncology Nurse Navigator (Oncology)  CHIEF COMPLAINTS/PURPOSE OF CONSULTATION:  Diagnosed history of bilateral breast cancer  HISTORY OF PRESENTING ILLNESS:  Sharon Phillips 50 y.o. female is here because of history of breast cancer.  Today she reports feeling well. She has a a history of bilateral breast cancer for which she received radiation, chemotherapy and bilateral mastectomies with reconstruction. She also has a history of ovarian cancer which was treated with hysterectomy in 2019. She is taking anastrozole and tolerating it well. She moved to [place] from [place] in June 2022. She reports her port-a-cath on her left chest was infected and was removed. She denies vomiting and tingling/numbness in hands and feet, and she reports chronic headaches and nausea. She also reports pain in her hands, especially while washing dishes. She is not currently taking Calcium or Vitamin D.   She denies any family history of cancer. She is not currently working, but she previously was a Glass blower/designer. She denies history of smoking and alcohol consumption.  In terms of breast cancer risk profile:  She menarched at early age of 57 and went to menopause at age 89 due to hysterectomy  She had 4 pregnancies, her first child was born at age 64  She never received birth control pills. She was never exposed to fertility medications or hormone replacement therapy.  She has no family history of Breast/GYN/GI cancer  I reviewed her records extensively and collaborated the history with the patient.  SUMMARY OF ONCOLOGIC HISTORY: Oncology History   No history exists.    MEDICAL HISTORY:  Past Medical History:  Diagnosis Date   Anxiety    Cancer ([place])     Depression    High cholesterol    Hypertension     SURGICAL HISTORY: Past Surgical History:  Procedure Laterality Date   ABDOMINAL HYSTERECTOMY     CENTRAL VENOUS CATHETER INSERTION Right 06/27/2021   Procedure: INSERTION CENTRAL LINE ADULT, tunneled;  Surgeon: Virl Cagey, MD;  Location: [facility];  Service: General;  Laterality: Right;   MASTECTOMY     bilateral    SOCIAL HISTORY: Social History   Socioeconomic History   Marital status: Legally Separated    Spouse name: Not on file   Number of children: Not on file   Years of education: Not on file   Highest education level: Not on file  Occupational History   Not on file  Tobacco Use   Smoking status: Never   Smokeless tobacco: Never  Substance and Sexual Activity   Alcohol use: Never   Drug use: Never   Sexual activity: Not on file  Other Topics Concern   Not on file  Social History Narrative   Not on file   Social Determinants of Health   Financial Resource Strain: Medium Risk   Difficulty of Paying Living Expenses: Somewhat hard  Food Insecurity: Food Insecurity Present   Worried About Running Out of Food in the Last Year: Sometimes true   Ran Out of Food in the Last Year: Sometimes true  Transportation Needs: No Transportation Needs   Lack of Transportation (Medical): No   Lack of Transportation (Non-Medical): No  Physical Activity: Sufficiently Active   Days of Exercise per Week: 7 days   Minutes of Exercise per  Session: 30 min  Stress: No Stress Concern Present   Feeling of Stress : Only a little  Social Connections: Moderately Isolated   Frequency of Communication with Friends and Family: More than three times a week   Frequency of Social Gatherings with Friends and Family: More than three times a week   Attends Religious Services: 1 to 4 times per year   Active Member of [employer] or Organizations: No   Attends Archivist Meetings: Never   Marital Status: Separated  Intimate Partner  Violence: Not At Risk   Fear of Current or Ex-Partner: No   Emotionally Abused: No   Physically Abused: No   Sexually Abused: No    FAMILY HISTORY: No family history on file.  ALLERGIES:  is allergic to chlorhexidine, hibiclens [chlorhexidine gluconate], and povidone-iodine.  MEDICATIONS:  Current Outpatient Medications  Medication Sig Dispense Refill   AJOVY 225 MG/1.5ML SOAJ Inject into the skin every 30 (thirty) days.     betamethasone valerate (VALISONE) 0.1 % cream Apply topically 2 (two) times daily.     cetirizine (ZYRTEC) 10 MG tablet Take 10 mg by mouth daily.     clonazePAM (KLONOPIN) 0.5 MG tablet Take 0.5 mg by mouth 2 (two) times daily as needed for anxiety.     exemestane (AROMASIN) 25 MG tablet Take 25 mg by mouth daily.     fluticasone (FLONASE) 50 MCG/ACT nasal spray Place into both nostrils daily.     gabapentin (NEURONTIN) 300 MG capsule Take 300 mg by mouth 2 (two) times daily.     lisinopril (ZESTRIL) 10 MG tablet Take 10 mg by mouth 2 (two) times daily.     Multiple Vitamin (MULTIVITAMIN) capsule Take 1 capsule by mouth daily.     sertraline (ZOLOFT) 50 MG tablet Take 50 mg by mouth daily.     topiramate (TOPAMAX) 25 MG tablet Take 25 mg by mouth 2 (two) times daily.     triamcinolone (KENALOG) 0.025 % cream Apply 1 application topically 2 (two) times daily.     vitamin E 180 MG (400 UNITS) capsule Take 400 Units by mouth daily.     acetaminophen (TYLENOL) 325 MG tablet Take 650 mg by mouth every 6 (six) hours as needed for mild pain, moderate pain, fever or headache. (Patient not taking: Reported on 08/01/2021)     Butalbital-Acetaminophen 50-300 MG CAPS Take 1 capsule by mouth every 4 (four) hours as needed for headache. (Patient not taking: Reported on 08/01/2021)     meclizine (ANTIVERT) 12.5 MG tablet Take 12.5 mg by mouth daily as needed for dizziness. (Patient not taking: Reported on 08/01/2021)     ondansetron (ZOFRAN-ODT) 8 MG disintegrating tablet Take 8 mg  by mouth every 8 (eight) hours as needed for nausea or vomiting. (Patient not taking: Reported on 08/01/2021)     prochlorperazine (COMPAZINE) 10 MG tablet Take 10 mg by mouth every 6 (six) hours as needed for nausea or vomiting. (Patient not taking: Reported on 08/01/2021)     QUEtiapine (SEROQUEL) 50 MG tablet Take 50 mg by mouth daily as needed (mood). (Patient not taking: Reported on 08/01/2021)     SUMAtriptan (IMITREX) 25 MG tablet Take 25 mg by mouth every 2 (two) hours as needed for migraine. (Patient not taking: Reported on 08/01/2021)     No current facility-administered medications for this visit.    REVIEW OF SYSTEMS:   Review of Systems  Constitutional:  Negative for appetite change and fatigue (75%).  Gastrointestinal:  Positive for nausea.  Musculoskeletal:  Positive for arthralgias.  Neurological:  Positive for dizziness and headaches.  Psychiatric/Behavioral:  Positive for sleep disturbance (falling asleep).   All other systems reviewed and are negative.  PHYSICAL EXAMINATION: ECOG PERFORMANCE STATUS: 0 - Asymptomatic  Vitals:   08/01/21 1319  Pulse: 96  Resp: 18  Temp: (!) 97 F (36.1 C)  SpO2: 100%   Filed Weights   08/01/21 1319  Weight: 180 lb 3.2 oz (81.7 kg)   Physical Exam Vitals reviewed.  Constitutional:      Appearance: Normal appearance.  Cardiovascular:     Rate and Rhythm: Normal rate and regular rhythm.     Pulses: Normal pulses.     Heart sounds: Normal heart sounds.  Pulmonary:     Effort: Pulmonary effort is normal.     Breath sounds: Normal breath sounds.  Chest:  Breasts:    Right: No mass, skin change (implants witin normal limits) or tenderness.     Left: No mass, skin change (implants within normal limits) or tenderness.  Musculoskeletal:     Right lower leg: No edema.     Left lower leg: No edema.  Neurological:     General: No focal deficit present.     Mental Status: She is alert and oriented to person, place, and time.   Psychiatric:        Mood and Affect: Mood normal.        Behavior: Behavior normal.    Breast Exam Chaperone: Thana Ates    LABORATORY DATA:  I have reviewed the data as listed Recent Results (from the past 2160 hour(s))  Blood culture (routine x 2)     Status: Abnormal   Collection Time: 06/21/21  7:33 AM   Specimen: Chest; Blood  Result Value Ref Range   Specimen Description      CHEST BOTTLES DRAWN AEROBIC AND ANAEROBIC Performed at [facility], [address] [place], [place] [address]    Special Requests      Blood Culture adequate volume Performed at [facility], [address], [place], [place] [address]    Culture  Setup Time      GRAM POSITIVE COCCI IN BOTH AEROBIC AND ANAEROBIC BOTTLES Gram Stain Report Called to,Read Back By and Verified With: brown,p @ 2133 by matthews,b 7.19.22  hosp Performed at [facility], [address], [place], [place] [address]    Culture (A)     STREPTOCOCCUS GROUP G SUSCEPTIBILITIES PERFORMED ON PREVIOUS CULTURE WITHIN THE LAST 5 DAYS. Performed at [facility], [place] [address], [place], [place] [address]    Report Status 06/24/2021 FINAL   HIV Antibody (routine testing w rflx)     Status: None   Collection Time: 06/21/21  7:33 AM  Result Value Ref Range   HIV Screen 4th Generation wRfx Non Reactive Non Reactive    Comment: Performed at [facility], [place] [address] [place], [place] [address]  Blood culture (routine x 2)     Status: Abnormal   Collection Time: 06/21/21  8:51 AM   Specimen: Chest; Blood  Result Value Ref Range   Specimen Description      CHEST BOTTLES DRAWN AEROBIC AND ANAEROBIC Performed at [facility], [address], [place], [place] [address]    Special Requests      Blood Culture adequate volume Performed at [facility], [address], [place], [place] [address]    Culture  Setup Time  GRAM POSITIVE COCCI IN BOTH AEROBIC AND ANAEROBIC BOTTLES Gram Stain Report Called  to,Read Back By and Verified With: Brown,P _0  by Matthews,B 7.19.22 [facility]    Culture STREPTOCOCCUS GROUP G (A)    Report Status 06/24/2021 FINAL    Organism ID, Bacteria STREPTOCOCCUS GROUP G       Susceptibility   Streptococcus group g - MIC*    CLINDAMYCIN <=0.25 SENSITIVE Sensitive     AMPICILLIN <=0.25 SENSITIVE Sensitive     ERYTHROMYCIN <=0.12 SENSITIVE Sensitive     VANCOMYCIN 0.5 SENSITIVE Sensitive     CEFTRIAXONE <=0.12 SENSITIVE Sensitive     LEVOFLOXACIN 0.5 SENSITIVE Sensitive     PENICILLIN Value in next row Sensitive      SENSITIVE0.06    * STREPTOCOCCUS GROUP G  Blood Culture ID Panel (Reflexed)     Status: Abnormal   Collection Time: 06/21/21  8:51 AM  Result Value Ref Range   Enterococcus faecalis NOT DETECTED NOT DETECTED   Enterococcus Faecium NOT DETECTED NOT DETECTED   Listeria monocytogenes NOT DETECTED NOT DETECTED   Staphylococcus species NOT DETECTED NOT DETECTED   Staphylococcus aureus (BCID) NOT DETECTED NOT DETECTED   Staphylococcus epidermidis NOT DETECTED NOT DETECTED   Staphylococcus lugdunensis NOT DETECTED NOT DETECTED   Streptococcus species DETECTED (A) NOT DETECTED    Comment: Not Enterococcus species, Streptococcus agalactiae, Streptococcus pyogenes, or Streptococcus pneumoniae. CRITICAL RESULT CALLED TO, READ BACK BY AND VERIFIED WITH: P. BROWN RN, AT 0321 06/22/21 D. VANHOOK    Streptococcus agalactiae NOT DETECTED NOT DETECTED   Streptococcus pneumoniae NOT DETECTED NOT DETECTED   Streptococcus pyogenes NOT DETECTED NOT DETECTED   A.calcoaceticus-baumannii NOT DETECTED NOT DETECTED   Bacteroides fragilis NOT DETECTED NOT DETECTED   Enterobacterales NOT DETECTED NOT DETECTED   Enterobacter cloacae complex NOT DETECTED NOT DETECTED   Escherichia coli NOT DETECTED NOT DETECTED   Klebsiella aerogenes NOT DETECTED NOT DETECTED   Klebsiella oxytoca NOT DETECTED NOT DETECTED   Klebsiella pneumoniae NOT DETECTED NOT DETECTED    Proteus species NOT DETECTED NOT DETECTED   Salmonella species NOT DETECTED NOT DETECTED   Serratia marcescens NOT DETECTED NOT DETECTED   Haemophilus influenzae NOT DETECTED NOT DETECTED   Neisseria meningitidis NOT DETECTED NOT DETECTED   Pseudomonas aeruginosa NOT DETECTED NOT DETECTED   Stenotrophomonas maltophilia NOT DETECTED NOT DETECTED   Candida albicans NOT DETECTED NOT DETECTED   Candida auris NOT DETECTED NOT DETECTED   Candida glabrata NOT DETECTED NOT DETECTED   Candida krusei NOT DETECTED NOT DETECTED   Candida parapsilosis NOT DETECTED NOT DETECTED   Candida tropicalis NOT DETECTED NOT DETECTED   Cryptococcus neoformans/gattii NOT DETECTED NOT DETECTED    Comment: Performed at [facility], [address] [place], [place] [address]  CBC     Status: Abnormal   Collection Time: 06/21/21  8:52 AM  Result Value Ref Range   WBC 5.8 4.0 - 10.5 K/uL   RBC 4.52 3.87 - 5.11 MIL/uL   Hemoglobin 14.3 12.0 - 15.0 g/dL   HCT 42.8 36.0 - 46.0 %   MCV 94.7 80.0 - 100.0 fL   MCH 31.6 26.0 - 34.0 pg   MCHC 33.4 30.0 - 36.0 g/dL   RDW 13.8 11.5 - 15.5 %   Platelets 112 (L) 150 - 400 K/uL    Comment: SPECIMEN CHECKED FOR CLOTS REPEATED TO VERIFY PLATELET COUNT CONFIRMED BY SMEAR    nRBC 0.0 0.0 - 0.2 %    Comment: Performed  at [facility], [address] [place], [place] [address]  Basic metabolic panel     Status: Abnormal   Collection Time: 06/21/21  8:52 AM  Result Value Ref Range   Sodium 135 135 - 145 mmol/L   Potassium 3.6 3.5 - 5.1 mmol/L   Chloride 106 98 - 111 mmol/L   CO2 22 22 - 32 mmol/L   Glucose, Bld 139 (H) 70 - 99 mg/dL    Comment: Glucose reference range applies only to samples taken after fasting for at least 8 hours.   BUN 13 6 - 20 mg/dL   Creatinine, Ser 0.87 0.44 - 1.00 mg/dL   Calcium 8.7 (L) 8.9 - 10.3 mg/dL   GFR, Estimated >60 >60 mL/min    Comment: (NOTE) Calculated using the CKD-EPI Creatinine Equation (2021)    Anion gap 7 5 - 15     Comment: Performed at [facility], [address], [place], [place] [address]  Resp Panel by RT-PCR (Flu A&B, Covid) Nasopharyngeal Swab     Status: None   Collection Time: 06/21/21  8:52 AM   Specimen: Nasopharyngeal Swab; Nasopharyngeal(NP) swabs in vial transport medium  Result Value Ref Range   SARS Coronavirus 2 by RT PCR NEGATIVE NEGATIVE    Comment: (NOTE) SARS-CoV-2 target nucleic acids are NOT DETECTED.  The SARS-CoV-2 RNA is generally detectable in upper respiratory specimens during the acute phase of infection. The lowest concentration of SARS-CoV-2 viral copies this assay can detect is 138 copies/mL. A negative result does not preclude SARS-Cov-2 infection and should not be used as the sole basis for treatment or other patient management decisions. A negative result may occur with  improper specimen collection/handling, submission of specimen other than nasopharyngeal swab, presence of viral mutation(s) within the areas targeted by this assay, and inadequate number of viral copies(<138 copies/mL). A negative result must be combined with clinical observations, patient history, and epidemiological information. The expected result is Negative.  Fact Sheet for Patients:  EntrepreneurPulse.com.au  Fact Sheet for Healthcare Providers:  IncredibleEmployment.be  This test is no t yet approved or cleared by the [place] FDA and  has been authorized for detection and/or diagnosis of SARS-CoV-2 by FDA under an Emergency Use Authorization (EUA). This EUA will remain  in effect (meaning this test can be used) for the duration of the COVID-19 declaration under Section 564(b)(1) of the Act, 21 U.S.C.section 360bbb-3(b)(1), unless the authorization is terminated  or revoked sooner.       Influenza A by PCR NEGATIVE NEGATIVE   Influenza B by PCR NEGATIVE NEGATIVE    Comment: (NOTE) The Xpert Xpress SARS-CoV-2/FLU/RSV plus assay is intended  as an aid in the diagnosis of influenza from Nasopharyngeal swab specimens and should not be used as a sole basis for treatment. Nasal washings and aspirates are unacceptable for Xpert Xpress SARS-CoV-2/FLU/RSV testing.  Fact Sheet for Patients: EntrepreneurPulse.com.au  Fact Sheet for Healthcare Providers: IncredibleEmployment.be  This test is not yet approved or cleared by the [place] FDA and has been authorized for detection and/or diagnosis of SARS-CoV-2 by FDA under an Emergency Use Authorization (EUA). This EUA will remain in effect (meaning this test can be used) for the duration of the COVID-19 declaration under Section 564(b)(1) of the Act, 21 U.S.C. section 360bbb-3(b)(1), unless the authorization is terminated or revoked.  Performed at [facility], [address] [place], [place] [address]   Lactic acid, plasma     Status: None   Collection Time: 06/21/21  9:10 AM  Result Value Ref Range   Lactic Acid, Venous 0.9 0.5 - 1.9 mmol/L    Comment: Performed at [facility], [address] [place], [place] [address]  Magnesium     Status: None   Collection Time: 06/22/21  5:24 AM  Result Value Ref Range   Magnesium 1.9 1.7 - 2.4 mg/dL    Comment: Performed at [facility], [address] [place], [place] [address]  Basic metabolic panel     Status: Abnormal   Collection Time: 06/22/21  5:24 AM  Result Value Ref Range   Sodium 134 (L) 135 - 145 mmol/L   Potassium 3.7 3.5 - 5.1 mmol/L   Chloride 108 98 - 111 mmol/L   CO2 21 (L) 22 - 32 mmol/L   Glucose, Bld 119 (H) 70 - 99 mg/dL    Comment: Glucose reference range applies only to samples taken after fasting for at least 8 hours.   BUN 9 6 - 20 mg/dL   Creatinine, Ser 0.79 0.44 - 1.00 mg/dL   Calcium 8.1 (L) 8.9 - 10.3 mg/dL   GFR, Estimated >60 >60 mL/min    Comment: (NOTE) Calculated using the CKD-EPI Creatinine Equation (2021)    Anion gap 5 5 - 15    Comment: Performed at  [facility], [address], [place], [place] [address]  CBC     Status: Abnormal   Collection Time: 06/22/21  5:24 AM  Result Value Ref Range   WBC 4.2 4.0 - 10.5 K/uL   RBC 3.83 (L) 3.87 - 5.11 MIL/uL   Hemoglobin 12.1 12.0 - 15.0 g/dL   HCT 36.9 36.0 - 46.0 %   MCV 96.3 80.0 - 100.0 fL   MCH 31.6 26.0 - 34.0 pg   MCHC 32.8 30.0 - 36.0 g/dL   RDW 14.0 11.5 - 15.5 %   Platelets 101 (L) 150 - 400 K/uL    Comment: SPECIMEN CHECKED FOR CLOTS Immature Platelet Fraction may be clinically indicated, consider ordering this additional test [id number] CONSISTENT WITH PREVIOUS RESULT    nRBC 0.0 0.0 - 0.2 %    Comment: Performed at [facility], [address] [place], [place] [address]  CBC Once     Status: Abnormal   Collection Time: 06/23/21  4:56 AM  Result Value Ref Range   WBC 4.4 4.0 - 10.5 K/uL   RBC 3.55 (L) 3.87 - 5.11 MIL/uL   Hemoglobin 11.4 (L) 12.0 - 15.0 g/dL   HCT 33.7 (L) 36.0 - 46.0 %   MCV 94.9 80.0 - 100.0 fL   MCH 32.1 26.0 - 34.0 pg   MCHC 33.8 30.0 - 36.0 g/dL   RDW 13.9 11.5 - 15.5 %   Platelets 100 (L) 150 - 400 K/uL    Comment: SPECIMEN CHECKED FOR CLOTS Immature Platelet Fraction may be clinically indicated, consider ordering this additional test [id number] CONSISTENT WITH PREVIOUS RESULT    nRBC 0.0 0.0 - 0.2 %    Comment: Performed at [facility], [address] [place],  [address]  Basic metabolic panel Once     Status: Abnormal   Collection Time: 06/23/21  4:56 AM  Result Value Ref Range   Sodium 135 135 - 145 mmol/L   Potassium 3.3 (L) 3.5 - 5.1 mmol/L   Chloride 109 98 - 111 mmol/L   CO2 20 (L) 22 - 32 mmol/L   Glucose, Bld 111 (H) 70 - 99 mg/dL    Comment: Glucose reference range applies only  to samples taken after fasting for at least 8 hours.   BUN 9 6 - 20 mg/dL   Creatinine, Ser 0.74 0.44 - 1.00 mg/dL   Calcium 8.0 (L) 8.9 - 10.3 mg/dL   GFR, Estimated >60 >60 mL/min    Comment: (NOTE) Calculated using the CKD-EPI Creatinine  Equation (2021)    Anion gap 6 5 - 15    Comment: Performed at [facility], [address], [place], [place] [address]  Magnesium     Status: None   Collection Time: 06/23/21  4:56 AM  Result Value Ref Range   Magnesium 1.9 1.7 - 2.4 mg/dL    Comment: Performed at [facility], [address], [place], [place] [address]  Blood culture (routine single)     Status: None   Collection Time: 06/23/21  8:40 AM   Specimen: Right Antecubital; Blood  Result Value Ref Range   Specimen Description      RIGHT ANTECUBITAL BOTTLES DRAWN AEROBIC AND ANAEROBIC   Special Requests      Blood Culture results may not be optimal due to an inadequate volume of blood received in culture bottles   Culture      NO GROWTH 5 DAYS Performed at [facility], [address] [place], [place] [address]    Report Status 06/28/2021 FINAL   CBC with Differential/Platelet     Status: Abnormal   Collection Time: 06/24/21  5:18 AM  Result Value Ref Range   WBC 5.2 4.0 - 10.5 K/uL   RBC 3.62 (L) 3.87 - 5.11 MIL/uL   Hemoglobin 11.5 (L) 12.0 - 15.0 g/dL   HCT 35.4 (L) 36.0 - 46.0 %   MCV 97.8 80.0 - 100.0 fL   MCH 31.8 26.0 - 34.0 pg   MCHC 32.5 30.0 - 36.0 g/dL   RDW 14.2 11.5 - 15.5 %   Platelets 107 (L) 150 - 400 K/uL    Comment: SPECIMEN CHECKED FOR CLOTS Immature Platelet Fraction may be clinically indicated, consider ordering this additional test [id number] CONSISTENT WITH PREVIOUS RESULT REPEATED TO VERIFY    nRBC 0.0 0.0 - 0.2 %   Neutrophils Relative % 62 %   Neutro Abs 3.3 1.7 - 7.7 K/uL   Lymphocytes Relative 25 %   Lymphs Abs 1.3 0.7 - 4.0 K/uL   Monocytes Relative 9 %   Monocytes Absolute 0.4 0.1 - 1.0 K/uL   Eosinophils Relative 2 %   Eosinophils Absolute 0.1 0.0 - 0.5 K/uL   Basophils Relative 1 %   Basophils Absolute 0.0 0.0 - 0.1 K/uL   Immature Granulocytes 1 %   Abs Immature Granulocytes 0.04 0.00 - 0.07 K/uL    Comment: Performed at [facility], [address], [place], [place]  [address]  Basic metabolic panel     Status: Abnormal   Collection Time: 06/24/21  5:18 AM  Result Value Ref Range   Sodium 141 135 - 145 mmol/L   Potassium 3.7 3.5 - 5.1 mmol/L   Chloride 116 (H) 98 - 111 mmol/L   CO2 18 (L) 22 - 32 mmol/L   Glucose, Bld 95 70 - 99 mg/dL    Comment: Glucose reference range applies only to samples taken after fasting for at least 8 hours.   BUN 7 6 - 20 mg/dL   Creatinine, Ser 0.55 0.44 - 1.00 mg/dL   Calcium 8.1 (L) 8.9 - 10.3 mg/dL   GFR, Estimated >60 >60 mL/min    Comment: (NOTE) Calculated using the CKD-EPI Creatinine  Equation (2021)    Anion gap 7 5 - 15    Comment: Performed at [facility], [address], [place], [place] [address]  Magnesium     Status: None   Collection Time: 06/24/21  5:18 AM  Result Value Ref Range   Magnesium 2.3 1.7 - 2.4 mg/dL    Comment: Performed at [facility], [address], [place], [place] [address]  CBC with Differential/Platelet     Status: Abnormal   Collection Time: 06/25/21  4:54 AM  Result Value Ref Range   WBC 4.5 4.0 - 10.5 K/uL   RBC 3.57 (L) 3.87 - 5.11 MIL/uL   Hemoglobin 11.2 (L) 12.0 - 15.0 g/dL   HCT 34.3 (L) 36.0 - 46.0 %   MCV 96.1 80.0 - 100.0 fL   MCH 31.4 26.0 - 34.0 pg   MCHC 32.7 30.0 - 36.0 g/dL   RDW 14.5 11.5 - 15.5 %   Platelets 151 150 - 400 K/uL   nRBC 0.0 0.0 - 0.2 %   Neutrophils Relative % 58 %   Neutro Abs 2.6 1.7 - 7.7 K/uL   Lymphocytes Relative 31 %   Lymphs Abs 1.4 0.7 - 4.0 K/uL   Monocytes Relative 8 %   Monocytes Absolute 0.4 0.1 - 1.0 K/uL   Eosinophils Relative 2 %   Eosinophils Absolute 0.1 0.0 - 0.5 K/uL   Basophils Relative 0 %   Basophils Absolute 0.0 0.0 - 0.1 K/uL   Immature Granulocytes 1 %   Abs Immature Granulocytes 0.06 0.00 - 0.07 K/uL    Comment: Performed at [facility], [address] [place], [place] [address]  Basic metabolic panel     Status: Abnormal   Collection Time: 06/25/21  4:54 AM  Result Value Ref Range   Sodium 142 135 - 145  mmol/L   Potassium 3.6 3.5 - 5.1 mmol/L   Chloride 112 (H) 98 - 111 mmol/L   CO2 24 22 - 32 mmol/L   Glucose, Bld 100 (H) 70 - 99 mg/dL    Comment: Glucose reference range applies only to samples taken after fasting for at least 8 hours.   BUN 7 6 - 20 mg/dL   Creatinine, Ser 0.62 0.44 - 1.00 mg/dL   Calcium 8.4 (L) 8.9 - 10.3 mg/dL   GFR, Estimated >60 >60 mL/min    Comment: (NOTE) Calculated using the CKD-EPI Creatinine Equation (2021)    Anion gap 6 5 - 15    Comment: Performed at [facility], [address] [place], [place] [id number]  Magnesium     Status: None   Collection Time: 06/25/21  4:54 AM  Result Value Ref Range   Magnesium 2.3 1.7 - 2.4 mg/dL    Comment: Performed at [facility], [address] [place], [place] [address]  Surgical PCR screen     Status: None   Collection Time: 06/27/21  3:41 AM   Specimen: Nasal Mucosa; Nasal Swab  Result Value Ref Range   MRSA, PCR NEGATIVE NEGATIVE   Staphylococcus aureus NEGATIVE NEGATIVE    Comment: (NOTE) The Xpert SA Assay (FDA approved for NASAL specimens in patients 29 years of age and older), is one component of a comprehensive surveillance program. It is not intended to diagnose infection nor to guide or monitor treatment. Performed at [facility], [address] [place], [place] [address]   Comprehensive metabolic panel     Status: Abnormal   Collection Time: 08/01/21  2:21 PM  Result Value  Ref Range   Sodium 139 135 - 145 mmol/L   Potassium 3.7 3.5 - 5.1 mmol/L   Chloride 108 98 - 111 mmol/L   CO2 26 22 - 32 mmol/L   Glucose, Bld 88 70 - 99 mg/dL    Comment: Glucose reference range applies only to samples taken after fasting for at least 8 hours.   BUN 14 6 - 20 mg/dL   Creatinine, Ser 0.70 0.44 - 1.00 mg/dL   Calcium 8.8 (L) 8.9 - 10.3 mg/dL   Total Protein 7.6 6.5 - 8.1 g/dL   Albumin 4.2 3.5 - 5.0 g/dL   AST 30 15 - 41 U/L   ALT 24 0 - 44 U/L   Alkaline Phosphatase 117 38 - 126 U/L   Total Bilirubin  0.4 0.3 - 1.2 mg/dL   GFR, Estimated >60 >60 mL/min    Comment: (NOTE) Calculated using the CKD-EPI Creatinine Equation (2021)    Anion gap 5 5 - 15    Comment: Performed at [facility], [address], [place], [place] [address]  CBC with Differential     Status: Abnormal   Collection Time: 08/01/21  2:21 PM  Result Value Ref Range   WBC 3.8 (L) 4.0 - 10.5 K/uL   RBC 4.13 3.87 - 5.11 MIL/uL   Hemoglobin 13.1 12.0 - 15.0 g/dL   HCT 41.1 36.0 - 46.0 %   MCV 99.5 80.0 - 100.0 fL   MCH 31.7 26.0 - 34.0 pg   MCHC 31.9 30.0 - 36.0 g/dL   RDW 13.3 11.5 - 15.5 %   Platelets 176 150 - 400 K/uL   nRBC 0.0 0.0 - 0.2 %   Neutrophils Relative % 47 %   Neutro Abs 1.8 1.7 - 7.7 K/uL   Lymphocytes Relative 40 %   Lymphs Abs 1.5 0.7 - 4.0 K/uL   Monocytes Relative 9 %   Monocytes Absolute 0.3 0.1 - 1.0 K/uL   Eosinophils Relative 3 %   Eosinophils Absolute 0.1 0.0 - 0.5 K/uL   Basophils Relative 1 %   Basophils Absolute 0.0 0.0 - 0.1 K/uL   Immature Granulocytes 0 %   Abs Immature Granulocytes 0.01 0.00 - 0.07 K/uL    Comment: Performed at [facility], [address] [place], [place] [address]    RADIOGRAPHIC STUDIES: I have personally reviewed the radiological reports and agreed with the findings in the report. No results found.   ASSESSMENT:  1.  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  Changes consistent with HPV effect.  Uterus, cervix, bilateral fallopian tubes and ovaries are negative for malignancy.  2.  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 - Non-smoker. - No family history of malignancies.  PLAN:  1.  T1CN1A right breast IDC, ER/PR positive, HER2 negative and T1CN1A left breast TNBC: -  I have reviewed her records from previous practice.  She is tolerating exemestane reasonably well. - Physical examination today did not reveal any palpable masses.  Implants were within normal limits. - She was previously treated in [place], [place].  She is transferring her oncological care to our clinic as she moved to [place] recently. - She had Port-A-Cath which was removed due to infection. - Recommend blood work including tumor markers and vitamin D level. - We will also obtain baseline bone density test.  2.  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  No tingling or numbness in the feet.  No pharmacological intervention needed.  3.  Bone health: - I have recommended her to start taking calcium and vitamin D supplements daily. - We will obtain bone density test as baseline.  We will also check vitamin D level.  4.  Nausea: - She has chronic nausea since she was treated with chemotherapy. - She will continue Compazine and Zofran as needed.    Derek Jack, MD 08/01/21 5:27 PM  [place] [phone]   I, Thana Ates, am acting as a scribe for Dr. Derek Jack.  I, Derek Jack MD, have reviewed the above documentation for accuracy and completeness, and I agree with the above.

## 2021-08-01 ENCOUNTER — Inpatient Hospital Stay (HOSPITAL_COMMUNITY): Payer: Medicare Other

## 2021-08-01 ENCOUNTER — Inpatient Hospital Stay (HOSPITAL_COMMUNITY): Payer: Medicare Other | Attending: Hematology | Admitting: Hematology

## 2021-08-01 ENCOUNTER — Encounter (HOSPITAL_COMMUNITY): Payer: Self-pay | Admitting: Hematology

## 2021-08-01 ENCOUNTER — Other Ambulatory Visit: Payer: Self-pay

## 2021-08-01 VITALS — HR 96 | Temp 97.0°F | Resp 18 | Ht 64.0 in | Wt 180.2 lb

## 2021-08-01 DIAGNOSIS — Z90710 Acquired absence of both cervix and uterus: Secondary | ICD-10-CM | POA: Insufficient documentation

## 2021-08-01 DIAGNOSIS — I10 Essential (primary) hypertension: Secondary | ICD-10-CM | POA: Insufficient documentation

## 2021-08-01 DIAGNOSIS — Z853 Personal history of malignant neoplasm of breast: Secondary | ICD-10-CM | POA: Insufficient documentation

## 2021-08-01 DIAGNOSIS — Z170 Estrogen receptor positive status [ER+]: Secondary | ICD-10-CM

## 2021-08-01 DIAGNOSIS — M810 Age-related osteoporosis without current pathological fracture: Secondary | ICD-10-CM

## 2021-08-01 DIAGNOSIS — C50912 Malignant neoplasm of unspecified site of left female breast: Secondary | ICD-10-CM | POA: Insufficient documentation

## 2021-08-01 DIAGNOSIS — C50911 Malignant neoplasm of unspecified site of right female breast: Secondary | ICD-10-CM | POA: Insufficient documentation

## 2021-08-01 DIAGNOSIS — E559 Vitamin D deficiency, unspecified: Secondary | ICD-10-CM

## 2021-08-01 LAB — COMPREHENSIVE METABOLIC PANEL
ALT: 24 U/L (ref 0–44)
AST: 30 U/L (ref 15–41)
Albumin: 4.2 g/dL (ref 3.5–5.0)
Alkaline Phosphatase: 117 U/L (ref 38–126)
Anion gap: 5 (ref 5–15)
BUN: 14 mg/dL (ref 6–20)
CO2: 26 mmol/L (ref 22–32)
Calcium: 8.8 mg/dL — ABNORMAL LOW (ref 8.9–10.3)
Chloride: 108 mmol/L (ref 98–111)
Creatinine, Ser: 0.7 mg/dL (ref 0.44–1.00)
GFR, Estimated: >60 mL/min (ref >60)
Glucose, Bld: 88 mg/dL (ref 70–99)
Potassium: 3.7 mmol/L (ref 3.5–5.1)
Sodium: 139 mmol/L (ref 135–145)
Total Bilirubin: 0.4 mg/dL (ref 0.3–1.2)
Total Protein: 7.6 g/dL (ref 6.5–8.1)

## 2021-08-01 LAB — CBC WITH DIFFERENTIAL/PLATELET
Abs Immature Granulocytes: 0.01 10*3/uL (ref 0.00–0.07)
Basophils Absolute: 0 10*3/uL (ref 0.0–0.1)
Basophils Relative: 1 %
Eosinophils Absolute: 0.1 10*3/uL (ref 0.0–0.5)
Eosinophils Relative: 3 %
HCT: 41.1 % (ref 36.0–46.0)
Hemoglobin: 13.1 g/dL (ref 12.0–15.0)
Immature Granulocytes: 0 %
Lymphocytes Relative: 40 %
Lymphs Abs: 1.5 10*3/uL (ref 0.7–4.0)
MCH: 31.7 pg (ref 26.0–34.0)
MCHC: 31.9 g/dL (ref 30.0–36.0)
MCV: 99.5 fL (ref 80.0–100.0)
Monocytes Absolute: 0.3 10*3/uL (ref 0.1–1.0)
Monocytes Relative: 9 %
Neutro Abs: 1.8 10*3/uL (ref 1.7–7.7)
Neutrophils Relative %: 47 %
Platelets: 176 10*3/uL (ref 150–400)
RBC: 4.13 MIL/uL (ref 3.87–5.11)
RDW: 13.3 % (ref 11.5–15.5)
WBC: 3.8 10*3/uL — ABNORMAL LOW (ref 4.0–10.5)
nRBC: 0 % (ref 0.0–0.2)

## 2021-08-01 LAB — VITAMIN D 25 HYDROXY (VIT D DEFICIENCY, FRACTURES): Vit D, 25-Hydroxy: 34.7 ng/mL (ref 30–100)

## 2021-08-01 NOTE — Patient Instructions (Addendum)
[place] at [facility] Discharge Instructions  You were seen and examined today by Dr. Delton Coombes. Dr. Delton Coombes discussed your past history of cancer.  Dr. Delton Coombes has recommended that you continue Anastrozole, but has recommended rechecking your bone strength. Dr. Delton Coombes has also recommended additional lab work to check your blood counts, electrolytes, liver/kidney function and breast cancer tumor markers.  Follow-up as scheduled in about 4 months.   Thank you for choosing [place] at [facility] to provide your oncology and hematology care.  To afford each patient quality time with our provider, please arrive at least 15 minutes before your scheduled appointment time.   If you have a lab appointment with the [facility] please come in thru the Main Entrance and check in at the main information desk  You need to re-schedule your appointment should you arrive 10 or more minutes late.  We strive to give you quality time with our providers, and arriving late affects you and other patients whose appointments are after yours.  Also, if you no show three or more times for appointments you may be dismissed from the clinic at the providers discretion.     Again, thank you for choosing [facility].  Our hope is that these requests will decrease the amount of time that you wait before being seen by our physicians.       _____________________________________________________________  Should you have questions after your visit to [facility], please contact our office at [phone] between the hours of 8:00 a.m. and 4:30 p.m.  Voicemails left after 4:00 p.m. will not be returned until the following business day.  For prescription refill requests, have your pharmacy contact our office and allow 72 hours.    Cancer Center Support Programs:   > Cancer Support Group  2nd Tuesday of the month 1pm-2pm, Journey Room

## 2021-08-02 LAB — CANCER ANTIGEN 15-3: CA 15-3: 22.2 U/mL (ref 0.0–25.0)

## 2021-08-02 LAB — CANCER ANTIGEN 27.29: CA 27.29: 21.9 U/mL (ref 0.0–38.6)

## 2021-08-05 ENCOUNTER — Ambulatory Visit (HOSPITAL_COMMUNITY)
Admission: RE | Admit: 2021-08-05 | Discharge: 2021-08-05 | Disposition: A | Discharge status: Home / Self Care | Payer: Medicare Other | Source: Ambulatory Visit | Attending: Hematology | Admitting: Hematology

## 2021-08-05 ENCOUNTER — Other Ambulatory Visit: Payer: Self-pay

## 2021-08-05 DIAGNOSIS — M810 Age-related osteoporosis without current pathological fracture: Secondary | ICD-10-CM

## 2021-08-05 DIAGNOSIS — Z853 Personal history of malignant neoplasm of breast: Secondary | ICD-10-CM | POA: Diagnosis present

## 2021-08-12 ENCOUNTER — Other Ambulatory Visit (HOSPITAL_COMMUNITY): Payer: Self-pay | Admitting: Surgery

## 2021-08-12 DIAGNOSIS — Z853 Personal history of malignant neoplasm of breast: Secondary | ICD-10-CM

## 2021-08-12 DIAGNOSIS — C50412 Malignant neoplasm of upper-outer quadrant of left female breast: Secondary | ICD-10-CM

## 2021-08-12 DIAGNOSIS — C50411 Malignant neoplasm of upper-outer quadrant of right female breast: Secondary | ICD-10-CM

## 2021-08-12 MED ORDER — EXEMESTANE 25 MG PO TABS: 25.0000 mg | ORAL_TABLET | Freq: Every day | ORAL | 3 refills | Status: DC

## 2021-12-07 NOTE — Progress Notes (Deleted)
Elevated alk phos 154 in Aug 2022. Repeat end of August all normal.

## 2021-12-08 ENCOUNTER — Ambulatory Visit: Payer: Medicare Other | Admitting: Gastroenterology

## 2021-12-12 ENCOUNTER — Inpatient Hospital Stay (HOSPITAL_COMMUNITY): Payer: Medicare Other | Attending: Hematology

## 2021-12-19 ENCOUNTER — Ambulatory Visit (HOSPITAL_COMMUNITY): Payer: Medicare Other | Admitting: Hematology

## 2021-12-28 ENCOUNTER — Ambulatory Visit: Payer: Medicare Other | Admitting: Gastroenterology

## 2021-12-28 ENCOUNTER — Ambulatory Visit (INDEPENDENT_AMBULATORY_CARE_PROVIDER_SITE_OTHER): Payer: Medicare Other | Admitting: Internal Medicine

## 2021-12-28 ENCOUNTER — Encounter: Payer: Self-pay | Admitting: Internal Medicine

## 2021-12-28 ENCOUNTER — Other Ambulatory Visit: Payer: Self-pay

## 2021-12-28 VITALS — BP 179/105 | HR 92 | Temp 97.5°F | Ht 64.0 in | Wt 178.6 lb

## 2021-12-28 DIAGNOSIS — Z853 Personal history of malignant neoplasm of breast: Secondary | ICD-10-CM | POA: Diagnosis not present

## 2021-12-28 DIAGNOSIS — Z1502 Genetic susceptibility to malignant neoplasm of ovary: Secondary | ICD-10-CM | POA: Diagnosis not present

## 2021-12-28 DIAGNOSIS — Z1509 Genetic susceptibility to other malignant neoplasm: Secondary | ICD-10-CM

## 2021-12-28 DIAGNOSIS — C50919 Malignant neoplasm of unspecified site of unspecified female breast: Secondary | ICD-10-CM

## 2021-12-28 DIAGNOSIS — K760 Fatty (change of) liver, not elsewhere classified: Secondary | ICD-10-CM | POA: Diagnosis not present

## 2021-12-28 DIAGNOSIS — Z1589 Genetic susceptibility to other disease: Secondary | ICD-10-CM

## 2021-12-28 NOTE — Progress Notes (Signed)
Primary Care Physician:  Patient, [name] Primary Gastroenterologist:  Dr. Abbey Chatters  Chief Complaint  Patient presents with   elevated liver enzymes    Previously seen in [place] (recently moved to [facility]).    HPI:   Sharon Phillips is a 51 y.o. female who presents to clinic today as a new patient for nonalcoholic fatty liver disease.  Previously seen in [place].  Moved to our area within the last year and wishes to establish care.  She has a history of bilateral breast cancer status postsurgical resection, radiation, chemotherapy, genetic work-up including heterozygous P ABL 2 mutation.  Initially saw GI due to concern for mutation effects on GI tract.  She also has a history of elevated liver function tests including chronically elevated alkaline phosphatase.  Alk phos isoenzymes showed normal distributions.  She underwent fibrosure which showed F0, no fibrosis.  Serological work-up unremarkable besides elevated ASMA of 32.  Hep B&C negative, ceruloplasmin, alpha 1 antitrypsin, iron saturation were all normal.  Celiac panel negative.  AMA normal.  Underwent liver biopsy October 2021 which showed fatty liver and mild inflammation.  Colonoscopy 02/12/2020 showed 1 small hyperplastic polyp removed with biopsies, internal hemorrhoids.  Recommended 10-year recall.  Past Medical History:  Diagnosis Date   Anxiety    Cancer ([place])    Depression    High cholesterol    Hypertension     Past Surgical History:  Procedure Laterality Date   ABDOMINAL HYSTERECTOMY     CENTRAL VENOUS CATHETER INSERTION Right 06/27/2021   Procedure: INSERTION CENTRAL LINE ADULT, tunneled;  Surgeon: Virl Cagey, MD;  Location: [facility];  Service: General;  Laterality: Right;   MASTECTOMY     bilateral    Current Outpatient Medications  Medication Sig Dispense Refill   AJOVY 225 MG/1.5ML SOAJ Inject into the skin every 30 (thirty) days.     betamethasone valerate (VALISONE) 0.1 % cream  Apply topically 2 (two) times daily.     cetirizine (ZYRTEC) 10 MG tablet Take 10 mg by mouth daily.     clonazePAM (KLONOPIN) 0.5 MG tablet Take 0.5 mg by mouth 2 (two) times daily as needed for anxiety.     exemestane (AROMASIN) 25 MG tablet Take 1 tablet (25 mg total) by mouth daily. 30 tablet 3   fluticasone (FLONASE) 50 MCG/ACT nasal spray Place into both nostrils daily.     gabapentin (NEURONTIN) 300 MG capsule Take 300 mg by mouth 2 (two) times daily.     lisinopril (ZESTRIL) 10 MG tablet Take 10 mg by mouth 2 (two) times daily.     meclizine (ANTIVERT) 12.5 MG tablet Take 12.5 mg by mouth daily as needed for dizziness.     Multiple Vitamin (MULTIVITAMIN) capsule Take 1 capsule by mouth daily.     ondansetron (ZOFRAN-ODT) 8 MG disintegrating tablet Take 8 mg by mouth every 8 (eight) hours as needed for nausea or vomiting.     prochlorperazine (COMPAZINE) 10 MG tablet Take 10 mg by mouth every 6 (six) hours as needed for nausea or vomiting.     sertraline (ZOLOFT) 50 MG tablet Take 50 mg by mouth daily.     SUMAtriptan (IMITREX) 100 MG tablet Take 100 mg by mouth every 2 (two) hours as needed for migraine. May repeat in 2 hours if headache persists or recurs.     topiramate (TOPAMAX) 25 MG tablet Take 25 mg by mouth 2 (two) times daily.     vitamin E  180 MG (400 UNITS) capsule Take 400 Units by mouth daily.     acetaminophen (TYLENOL) 325 MG tablet Take 650 mg by mouth every 6 (six) hours as needed for mild pain, moderate pain, fever or headache. (Patient not taking: Reported on 08/01/2021)     Butalbital-Acetaminophen 50-300 MG CAPS Take 1 capsule by mouth every 4 (four) hours as needed for headache. (Patient not taking: Reported on 08/01/2021)     QUEtiapine (SEROQUEL) 50 MG tablet Take 50 mg by mouth daily as needed (mood). (Patient not taking: Reported on 08/01/2021)     SUMAtriptan (IMITREX) 25 MG tablet Take 25 mg by mouth every 2 (two) hours as needed for migraine. (Patient not taking:  Reported on 08/01/2021)     triamcinolone (KENALOG) 0.025 % cream Apply 1 application topically 2 (two) times daily. (Patient not taking: Reported on 12/28/2021)     No current facility-administered medications for this visit.    Allergies as of 12/28/2021 - Review Complete 12/28/2021  Allergen Reaction Noted   Chlorhexidine Itching 06/19/2018   Hibiclens [chlorhexidine gluconate]  12/05/2020   Povidone-iodine  06/19/2018    No family history on file.  Social History   Socioeconomic History   Marital status: Legally Separated    Spouse name: Not on file   Number of children: Not on file   Years of education: Not on file   Highest education level: Not on file  Occupational History   Not on file  Tobacco Use   Smoking status: Never   Smokeless tobacco: Never  Substance and Sexual Activity   Alcohol use: Never   Drug use: Never   Sexual activity: Not on file  Other Topics Concern   Not on file  Social History Narrative   Not on file   Social Determinants of Health   Financial Resource Strain: Medium Risk   Difficulty of Paying Living Expenses: Somewhat hard  Food Insecurity: Food Insecurity Present   Worried About Running Out of Food in the Last Year: Sometimes true   Ran Out of Food in the Last Year: Sometimes true  Transportation Needs: No Transportation Needs   Lack of Transportation (Medical): No   Lack of Transportation (Non-Medical): No  Physical Activity: Sufficiently Active   Days of Exercise per Week: 7 days   Minutes of Exercise per Session: 30 min  Stress: No Stress Concern Present   Feeling of Stress : Only a little  Social Connections: Moderately Isolated   Frequency of Communication with Friends and Family: More than three times a week   Frequency of Social Gatherings with Friends and Family: More than three times a week   Attends Religious Services: 1 to 4 times per year   Active Member of [employer] or Organizations: No   Attends Archivist  Meetings: Never   Marital Status: Separated  Intimate Partner Violence: Not At Risk   Fear of Current or Ex-Partner: No   Emotionally Abused: No   Physically Abused: No   Sexually Abused: No    Subjective: Review of Systems  Constitutional:  Negative for chills and fever.  HENT:  Negative for congestion and hearing loss.   Eyes:  Negative for blurred vision and double vision.  Respiratory:  Negative for cough and shortness of breath.   Cardiovascular:  Negative for chest pain and palpitations.  Gastrointestinal:  Negative for abdominal pain, blood in stool, constipation, diarrhea, heartburn, melena and vomiting.  Genitourinary:  Negative for dysuria and urgency.  Musculoskeletal:  Negative  for joint pain and myalgias.  Skin:  Negative for itching and rash.  Neurological:  Negative for dizziness and headaches.  Psychiatric/Behavioral:  Negative for depression. The patient is not nervous/anxious.       Objective: BP (!) 179/105 Comment: right lower leg (had lymph nodes removed both arms)   Pulse 92    Temp (!) 97.5 F (36.4 C) (Temporal)    Ht 5' 4" (1.626 m)    Wt 178 lb 9.6 oz (81 kg)    BMI 30.66 kg/m  Physical Exam Constitutional:      Appearance: Normal appearance.  HENT:     Head: Normocephalic and atraumatic.  Eyes:     Extraocular Movements: Extraocular movements intact.     Conjunctiva/sclera: Conjunctivae normal.  Cardiovascular:     Rate and Rhythm: Normal rate and regular rhythm.  Pulmonary:     Effort: Pulmonary effort is normal.     Breath sounds: Normal breath sounds.  Abdominal:     General: Bowel sounds are normal.     Palpations: Abdomen is soft.  Musculoskeletal:        General: No swelling. Normal range of motion.     Cervical back: Normal range of motion and neck supple.  Skin:    General: Skin is warm and dry.     Coloration: Skin is not jaundiced.  Neurological:     General: No focal deficit present.     Mental Status: She is alert and  oriented to person, place, and time.  Psychiatric:        Mood and Affect: Mood normal.        Behavior: Behavior normal.     Assessment: *Nonalcoholic fatty liver disease *PALB2 heterozygous mutation  Plan: Discussed nonalcoholic fatty liver disease in depth with patient today.  Recommend 1-2# weight loss per week until ideal body weight through exercise & diet. Low fat/cholesterol diet.   Avoid sweets, sodas, fruit juices, sweetened beverages like tea, etc. Gradually increase exercise from 15 min daily up to 1 hr per day 5 days/week. Limit alcohol use.  Will refer to genetic counselor regards to her PALB2 heterozygous mutation.  Unclear data, appears to be slight increased risk of pancreatic cancer though no good screening guidelines at present.  Effect on stomach and colon malignancy unclear.  Follow-up in 4 months or sooner if needed.  12/28/2021 4:04 PM   Disclaimer: This note was dictated with voice recognition software. Similar sounding words can inadvertently be transcribed and may not be corrected upon review.

## 2021-12-28 NOTE — Patient Instructions (Addendum)
We will continue to watch your fatty liver disease.  I have printed out some recommendations in regards to this below.  I am going to refer you to a genetic counselor in regards to your PALB 2 gene mutation.  Once I received all of your previous GI records, I will go through these closely to see if there is anything else we need to do at present time.  Otherwise follow-up in 4 months.  It was very nice meeting you today.  Dr. Abbey Chatters  Nonalcoholic Fatty Liver Disease Diet, Adult Nonalcoholic fatty liver disease is a condition that causes fat to build up in and around the liver. The disease makes it harder for the liver to work the way that it should. Following a healthy diet can help to keep nonalcoholic fatty liver disease under control. It can also help to prevent or improve conditions that are associated with the disease, such as heart disease, diabetes, high blood pressure, and abnormal cholesterol levels. Along with regular exercise, this diet: Promotes weight loss. Helps to control blood sugar levels. Helps to improve the way that the body uses insulin. What are tips for following this plan? Reading food labels  Always check food labels for: The amount of saturated fat in a food. You should limit your intake of saturated fat. Saturated fat is found in foods that come from animals, including meat and dairy products such as butter, cheese, and whole milk. The amount of fiber in a food. You should choose high-fiber foods such as fruits, vegetables, and whole grains. Try to get 25-30 grams (g) of fiber a day.   Cooking When cooking, use heart-healthy oils that are high in monounsaturated fats. These include olive oil, canola oil, and avocado oil. Limit frying or deep-frying foods. Cook foods using healthy methods such as baking, boiling, steaming, and grilling instead. Meal planning You may want to keep track of how many calories you take in. Eating the right amount of calories will  help you achieve a healthy weight. Meeting with a registered dietitian can help you get started. Limit how often you eat takeout and fast food. These foods are usually very high in fat, salt, and sugar. Use the glycemic index (GI) to plan your meals. The index tells you how quickly a food will raise your blood sugar. Choose low-GI foods (GI less than 55). These foods take a longer time to raise blood sugar. A registered dietitian can help you identify foods lower on the GI scale. Lifestyle You may want to follow a Mediterranean diet. This diet includes a lot of vegetables, lean meats or fish, whole grains, fruits, and healthy oils and fats. What foods can I eat?    Fruits Bananas. Apples. Oranges. Grapes. Papaya. Mango. Pomegranate. Kiwi. Grapefruit. Cherries. Vegetables Lettuce. Spinach. Peas. Beets. Cauliflower. Cabbage. Broccoli. Carrots. Tomatoes. Squash. Eggplant. Herbs. Peppers. Onions. Cucumbers. Brussels sprouts. Yams and sweet potatoes. Beans. Lentils. Grains Whole wheat or whole-grain foods, including breads, crackers, cereals, and pasta. Stone-ground whole wheat. Unsweetened oatmeal. Bulgur. Barley. Quinoa. Brown or wild rice. Corn or whole wheat flour tortillas. Meats and other proteins Lean meats. Poultry. Tofu. Seafood and shellfish. Dairy Low-fat or fat-free dairy products, such as yogurt, cottage cheese, or cheese. Beverages Water. Sugar-free drinks. Tea. Coffee. Low-fat or skim milk. Milk alternatives, such as soy or almond milk. Real fruit juice. Fats and oils Avocado. Canola or olive oil. Nuts and nut butters. Seeds. Seasonings and condiments Mustard. Relish. Low-fat, low-sugar ketchup and barbecue sauce. Low-fat or  fat-free mayonnaise. Sweets and desserts Sugar-free sweets. The items listed above may not be a complete list of foods and beverages you can eat. Contact a dietitian for more information. What foods should I limit or avoid? Meats and other proteins Limit  red meat to 1-2 times a week. Dairy [employer]. Fats and oils Palm oil and coconut oil. Fried foods. Other foods Processed foods. Foods that contain a lot of salt or sodium. Sweets and desserts Sweets that contain sugar. Beverages Sweetened drinks, such as sweet tea, milkshakes, iced sweet drinks, and sodas. Alcohol. The items listed above may not be a complete list of foods and beverages you should avoid. Contact a dietitian for more information. Where to find more information The [employer] of Diabetes and Digestive and Kidney Diseases: [employer] Summary Nonalcoholic fatty liver disease is a condition that causes fat to build up in and around the liver. Following a healthy diet can help to keep nonalcoholic fatty liver disease under control. Your diet should be rich in fruits, vegetables, whole grains, and lean proteins. Limit your intake of saturated fat. Saturated fat is found in foods that come from animals, including meat and dairy products such as butter, cheese, and whole milk. This diet promotes weight loss, helps to control blood sugar levels, and helps to improve the way that the body uses insulin. This information is not intended to replace advice given to you by your health care provider. Make sure you discuss any questions you have with your health care provider. Document Revised: 03/14/2019 Document Reviewed: 12/12/2018 Elsevier Patient Education  2020 [facility] we value your feedback. You may receive a survey about your visit today. Please share your experience as we strive to create trusting relationships with our patients to provide genuine, compassionate, quality care.  We appreciate your understanding and patience as we review any laboratory studies, imaging, and other diagnostic tests that are ordered as we care for you. Our office policy is 5 business days for review of these results, and any emergent or urgent  results are addressed in a timely manner for your best interest. If you do not hear from our office in 1 week, please contact us.   We also encourage the use of MyChart, which contains your medical information for your review as well. If you are not enrolled in this feature, an access code is on this after visit summary for your convenience. Thank you for allowing [place] to be involved in your care.  It was great to see you today!  I hope you have a great rest of your Winter!    Sharon Phillips. Abbey Chatters, D.O. Gastroenterology and Hepatology [facility]

## 2021-12-31 ENCOUNTER — Other Ambulatory Visit (HOSPITAL_COMMUNITY): Payer: Self-pay | Admitting: Hematology and Oncology

## 2021-12-31 DIAGNOSIS — Z853 Personal history of malignant neoplasm of breast: Secondary | ICD-10-CM

## 2021-12-31 DIAGNOSIS — C50411 Malignant neoplasm of upper-outer quadrant of right female breast: Secondary | ICD-10-CM

## 2022-01-04 ENCOUNTER — Other Ambulatory Visit (HOSPITAL_COMMUNITY): Payer: Self-pay | Admitting: Hematology and Oncology

## 2022-01-04 DIAGNOSIS — C50411 Malignant neoplasm of upper-outer quadrant of right female breast: Secondary | ICD-10-CM

## 2022-01-04 DIAGNOSIS — C50412 Malignant neoplasm of upper-outer quadrant of left female breast: Secondary | ICD-10-CM

## 2022-01-04 DIAGNOSIS — Z853 Personal history of malignant neoplasm of breast: Secondary | ICD-10-CM

## 2022-01-06 ENCOUNTER — Other Ambulatory Visit (HOSPITAL_COMMUNITY): Payer: Self-pay | Admitting: Hematology and Oncology

## 2022-01-06 DIAGNOSIS — C50411 Malignant neoplasm of upper-outer quadrant of right female breast: Secondary | ICD-10-CM

## 2022-01-06 DIAGNOSIS — C50412 Malignant neoplasm of upper-outer quadrant of left female breast: Secondary | ICD-10-CM

## 2022-01-06 DIAGNOSIS — Z853 Personal history of malignant neoplasm of breast: Secondary | ICD-10-CM

## 2022-01-09 ENCOUNTER — Other Ambulatory Visit (HOSPITAL_COMMUNITY): Payer: Self-pay | Admitting: *Deleted

## 2022-01-09 DIAGNOSIS — Z170 Estrogen receptor positive status [ER+]: Secondary | ICD-10-CM

## 2022-01-09 DIAGNOSIS — Z853 Personal history of malignant neoplasm of breast: Secondary | ICD-10-CM

## 2022-01-09 DIAGNOSIS — C50411 Malignant neoplasm of upper-outer quadrant of right female breast: Secondary | ICD-10-CM

## 2022-01-09 MED ORDER — EXEMESTANE 25 MG PO TABS: 25.0000 mg | ORAL_TABLET | Freq: Every day | ORAL | 3 refills | Status: DC

## 2022-01-09 NOTE — Telephone Encounter (Signed)
Per Dr. Tomie China August note, patient is to continue on exemestane and has been sent to her pharmacy for refill.

## 2022-01-19 ENCOUNTER — Inpatient Hospital Stay (HOSPITAL_COMMUNITY): Payer: Medicare Other | Attending: Hematology

## 2022-01-19 DIAGNOSIS — R110 Nausea: Secondary | ICD-10-CM | POA: Diagnosis not present

## 2022-01-19 DIAGNOSIS — Z853 Personal history of malignant neoplasm of breast: Secondary | ICD-10-CM | POA: Diagnosis not present

## 2022-01-19 DIAGNOSIS — G629 Polyneuropathy, unspecified: Secondary | ICD-10-CM | POA: Diagnosis present

## 2022-01-19 DIAGNOSIS — M810 Age-related osteoporosis without current pathological fracture: Secondary | ICD-10-CM | POA: Diagnosis not present

## 2022-01-19 DIAGNOSIS — E559 Vitamin D deficiency, unspecified: Secondary | ICD-10-CM

## 2022-01-19 LAB — CBC WITH DIFFERENTIAL/PLATELET
Abs Immature Granulocytes: 0.01 10*3/uL (ref 0.00–0.07)
Basophils Absolute: 0 10*3/uL (ref 0.0–0.1)
Basophils Relative: 1 %
Eosinophils Absolute: 0.1 10*3/uL (ref 0.0–0.5)
Eosinophils Relative: 2 %
HCT: 42.9 % (ref 36.0–46.0)
Hemoglobin: 14.3 g/dL (ref 12.0–15.0)
Immature Granulocytes: 0 %
Lymphocytes Relative: 41 %
Lymphs Abs: 1.4 10*3/uL (ref 0.7–4.0)
MCH: 32.3 pg (ref 26.0–34.0)
MCHC: 33.3 g/dL (ref 30.0–36.0)
MCV: 96.8 fL (ref 80.0–100.0)
Monocytes Absolute: 0.2 10*3/uL (ref 0.1–1.0)
Monocytes Relative: 7 %
Neutro Abs: 1.6 10*3/uL — ABNORMAL LOW (ref 1.7–7.7)
Neutrophils Relative %: 49 %
Platelets: 174 10*3/uL (ref 150–400)
RBC: 4.43 MIL/uL (ref 3.87–5.11)
RDW: 12.8 % (ref 11.5–15.5)
WBC: 3.4 10*3/uL — ABNORMAL LOW (ref 4.0–10.5)
nRBC: 0 % (ref 0.0–0.2)

## 2022-01-19 LAB — COMPREHENSIVE METABOLIC PANEL
ALT: 32 U/L (ref 0–44)
AST: 29 U/L (ref 15–41)
Albumin: 4.1 g/dL (ref 3.5–5.0)
Alkaline Phosphatase: 107 U/L (ref 38–126)
Anion gap: 9 (ref 5–15)
BUN: 15 mg/dL (ref 6–20)
CO2: 24 mmol/L (ref 22–32)
Calcium: 9.2 mg/dL (ref 8.9–10.3)
Chloride: 105 mmol/L (ref 98–111)
Creatinine, Ser: 0.81 mg/dL (ref 0.44–1.00)
GFR, Estimated: >60 mL/min (ref >60)
Glucose, Bld: 110 mg/dL — ABNORMAL HIGH (ref 70–99)
Potassium: 4 mmol/L (ref 3.5–5.1)
Sodium: 138 mmol/L (ref 135–145)
Total Bilirubin: 0.7 mg/dL (ref 0.3–1.2)
Total Protein: 7 g/dL (ref 6.5–8.1)

## 2022-01-19 LAB — VITAMIN D 25 HYDROXY (VIT D DEFICIENCY, FRACTURES): Vit D, 25-Hydroxy: 29.05 ng/mL — ABNORMAL LOW (ref 30–100)

## 2022-01-20 LAB — CANCER ANTIGEN 15-3: CA 15-3: 20.5 U/mL (ref 0.0–25.0)

## 2022-01-20 LAB — CANCER ANTIGEN 27.29: CA 27.29: 18.6 U/mL (ref 0.0–38.6)

## 2022-01-23 NOTE — Progress Notes (Signed)
REFERRING PROVIDER: Eloise Harman, DO [address] [place],  [place] [address]  PRIMARY PROVIDER:  Patient, [name]  PRIMARY REASON FOR VISIT:  Encounter Diagnosis  Name Primary?   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 Nery, a 51 y.o. female, was seen for a [place] cancer genetics consultation due to a personal and family history of cancer.  Ms. Saad presents to clinic today to discuss the possibility of a hereditary predisposition to cancer, to discuss genetic testing, and to further clarify her future cancer risks, as well as potential cancer risks for family members. 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 However, Ms. Comp is unaware of any previous hereditary cancer genetic testing and there are no results available in her chart.   CANCER HISTORY:  In May 2019, at the age of 7, Ms. Wiesen was diagnosed with bilateral breast cancer. Ms. Weir reports she was also diagnosed with ovarian cancer and she had a TAH/BSO at the time of her mastectomy.   RISK FACTORS:  First live birth at age 40.  Ovaries intact: no.  Uterus intact: no.  Menopausal status: postmenopausal.  HRT use: 0 years. Colonoscopy: yes; 2019 Any excessive radiation exposure in the past: no  Past Medical History:  Diagnosis Date   Anxiety    Cancer ([place])    Depression    High cholesterol    Hypertension     Past Surgical History:  Procedure Laterality Date   ABDOMINAL HYSTERECTOMY     CENTRAL VENOUS CATHETER INSERTION Right 06/27/2021   Procedure: INSERTION CENTRAL LINE ADULT, tunneled;  Surgeon: Virl Cagey, MD;  Location: [facility];  Service: General;  Laterality: Right;   MASTECTOMY     bilateral    Social History   Socioeconomic History   Marital status: Legally Separated    Spouse name: Not on file   Number of  children: Not on file   Years of education: Not on file   Highest education level: Not on file  Occupational History   Not on file  Tobacco Use   Smoking status: Never   Smokeless tobacco: Never  Substance and Sexual Activity   Alcohol use: Never   Drug use: Never   Sexual activity: Not on file  Other Topics Concern   Not on file  Social History Narrative   Not on file   Social Determinants of Health   Financial Resource Strain: Medium Risk   Difficulty of Paying Living Expenses: Somewhat hard  Food Insecurity: Food Insecurity Present   Worried About Running Out of Food in the Last Year: Sometimes true   Ran Out of Food in the Last Year: Sometimes true  Transportation Needs: No Transportation Needs   Lack of Transportation (Medical): No   Lack of Transportation (Non-Medical): No  Physical Activity: Sufficiently Active   Days of Exercise per Week: 7 days   Minutes of Exercise per Session: 30 min  Stress: No Stress Concern Present   Feeling of Stress : Only a little  Social Connections: Moderately Isolated   Frequency of Communication with Friends and Family: More than three times a week   Frequency of Social Gatherings with Friends and Family: More than three times a week   Attends Religious Services: 1 to 4 times per year   Active Member of Clubs or Organizations: No  Attends Club or Organization Meetings: Never  ° Marital Status: Separated  °  ° °FAMILY HISTORY:  °We obtained a detailed, 4-generation family history.  Significant diagnoses are listed below: °Family History  °Problem Relation Age of Onset  ° Breast cancer Maternal Aunt   ° Ovarian cancer Maternal Aunt   ° ° ° °Ms. Bayon reports a maternal aunt diagnosed with breast cancer and a second maternal aunt diagnosed with ovarian cancer, both are still living. Her mother had a premenopausal TAH/BSO. Of note, Ms. Kittleson has limited information about her family medical history. Ms. Mohrmann is unaware of previous family  history of genetic testing for hereditary cancer risks. There is no reported Ashkenazi Jewish ancestry.  ° °GENETIC COUNSELING ASSESSMENT: Ms. Reesman is a 50 y.o. female with a personal and family history of cancer which is somewhat suggestive of a hereditary cancer syndrome and predisposition to cancer. Prior clinic notes have mentioned Ms. Guzy has a PALB2 gene mutation. However, Ms. Resh is unaware of any previous hereditary cancer genetic testing and there are no results available in her chart. We, therefore, discussed and recommended the following at today's visit.  ° °DISCUSSION: We discussed that 5 - 10% of cancer is hereditary, with most cases of hereditary breast cancer associated with mutations in BRCA1/2.  There are other genes that can be associated with hereditary breast and ovarian cancer syndromes. Type of cancer risk and level of risk are gene-specific. We discussed that testing is beneficial for several reasons including knowing how to follow individuals after completing their treatment, identifying whether potential treatment options would be beneficial, and understanding if other family members could be at risk for cancer and allowing them to undergo genetic testing.  ° °We reviewed the characteristics, features and inheritance patterns of hereditary cancer syndromes. We also discussed genetic testing, including the appropriate family members to test, the process of testing, insurance coverage and turn-around-time for results. We discussed the implications of a negative, positive and/or variant of uncertain significant result. ° °We discussed the cancer risks and management recommendations for individuals with a PALB2 gene mutation in detail. We discussed the option to perform genetic testing because we do not have a genetic test report and Ms. Mcwhirter is unaware of any prior genetic testing. We offered genetic testing for the PALB2 gene only or a panel of genes associated with breast and  ovarian cancer. We recommended a panel of genes because we do not have her prior results. After considering the benefits and limitations of each option, Ms. Dinkins elected to have [facility] CancerNext Panel+RNA (36 genes). ° °The CancerNext gene panel offered by [facility] includes sequencing, rearrangement analysis, and RNA analysis for the following 36 genes:   APC, ATM, AXIN2, BARD1, BMPR1A, BRCA1, BRCA2, BRIP1, CDH1, CDK4, CDKN2A, CHEK2, DICER1, HOXB13, EPCAM, GREM1, MLH1, MSH2, MSH3, MSH6, MUTYH, NBN, NF1, NTHL1, PALB2, PMS2, POLD1, POLE, PTEN, RAD51C, RAD51D, RECQL, SMAD4, SMARCA4, STK11, and TP53.   ° °Based on Ms. Dilks's personal and family history of cancer, she meets medical criteria for genetic testing. Despite that she meets criteria, she may still have an out of pocket cost. We discussed that if her out of pocket cost for testing is over $100, the laboratory should contact them to discuss self-pay prices, patient pay assistance programs, if applicable, and other billing options.  ° °PLAN: After considering the risks, benefits, and limitations, Ms. Vassar provided informed consent to pursue genetic testing and the blood sample was sent to [facility] for analysis   of the CancerNext Panel+RNA. Results should be available within approximately 2-3 weeks' time, at which point they will be disclosed by telephone to Ms. Hurn, as will any additional recommendations warranted by these results. Ms. Ivens will receive a summary of her genetic counseling visit and a copy of her results once available. This information will also be available in Epic.  ° °Ms. Skiff's questions were answered to her satisfaction today. Our contact information was provided should additional questions or concerns arise. Thank you for the referral and allowing us to share in the care of your patient.  ° ° Flippin, MS, LCGC °Genetic Counselor °[email or web address] °(P) [phone] ° °The patient was seen  for a total of 40 minutes in face-to-face genetic counseling. The patient was seen alone.  Drs. Gudena and/or Feng were available to discuss this case as needed.  °_______________________________________________________________________ °For Office Staff:  °Number of people involved in session: 1 °Was an Intern/ student involved with case: no ° °

## 2022-01-24 ENCOUNTER — Other Ambulatory Visit: Payer: Self-pay | Admitting: Genetic Counselor

## 2022-01-24 ENCOUNTER — Other Ambulatory Visit: Payer: Self-pay

## 2022-01-24 ENCOUNTER — Inpatient Hospital Stay: Payer: Medicare Other

## 2022-01-24 ENCOUNTER — Inpatient Hospital Stay: Payer: Medicare Other | Attending: Genetic Counselor | Admitting: Genetic Counselor

## 2022-01-24 ENCOUNTER — Encounter: Payer: Self-pay | Admitting: Genetic Counselor

## 2022-01-24 DIAGNOSIS — Z170 Estrogen receptor positive status [ER+]: Secondary | ICD-10-CM

## 2022-01-24 DIAGNOSIS — C50411 Malignant neoplasm of upper-outer quadrant of right female breast: Secondary | ICD-10-CM | POA: Diagnosis not present

## 2022-01-24 DIAGNOSIS — C50412 Malignant neoplasm of upper-outer quadrant of left female breast: Secondary | ICD-10-CM | POA: Diagnosis not present

## 2022-01-24 DIAGNOSIS — Z1379 Encounter for other screening for genetic and chromosomal anomalies: Secondary | ICD-10-CM

## 2022-01-24 NOTE — Progress Notes (Signed)
Sharon Phillips

## 2022-01-26 ENCOUNTER — Other Ambulatory Visit (HOSPITAL_COMMUNITY): Payer: Self-pay | Admitting: *Deleted

## 2022-01-26 ENCOUNTER — Inpatient Hospital Stay (HOSPITAL_BASED_OUTPATIENT_CLINIC_OR_DEPARTMENT_OTHER): Payer: Medicare Other | Admitting: Hematology

## 2022-01-26 ENCOUNTER — Other Ambulatory Visit: Payer: Self-pay

## 2022-01-26 VITALS — BP 139/87 | HR 98 | Temp 98.9°F | Resp 20 | Ht 62.99 in | Wt 180.0 lb

## 2022-01-26 DIAGNOSIS — C50411 Malignant neoplasm of upper-outer quadrant of right female breast: Secondary | ICD-10-CM

## 2022-01-26 DIAGNOSIS — E559 Vitamin D deficiency, unspecified: Secondary | ICD-10-CM

## 2022-01-26 DIAGNOSIS — Z853 Personal history of malignant neoplasm of breast: Secondary | ICD-10-CM | POA: Diagnosis not present

## 2022-01-26 DIAGNOSIS — G629 Polyneuropathy, unspecified: Secondary | ICD-10-CM | POA: Diagnosis not present

## 2022-01-26 DIAGNOSIS — Z170 Estrogen receptor positive status [ER+]: Secondary | ICD-10-CM

## 2022-01-26 DIAGNOSIS — M810 Age-related osteoporosis without current pathological fracture: Secondary | ICD-10-CM | POA: Diagnosis not present

## 2022-01-26 DIAGNOSIS — C50412 Malignant neoplasm of upper-outer quadrant of left female breast: Secondary | ICD-10-CM

## 2022-01-26 NOTE — Patient Instructions (Addendum)
[place] [facility] at [facility] Discharge Instructions   You were seen and examined today by Dr. Delton Coombes.  He reviewed your lab work which is normal/stable, except for your Vitamin D, which was slightly low.  You should take calcium + Vitamin D over the counter supplement every day.   Return as scheduled for lab work and office visit.    Thank you for choosing [facility] at [facility] to provide your oncology and hematology care.  To afford each patient quality time with our provider, please arrive at least 15 minutes before your scheduled appointment time.   If you have a lab appointment with the [facility] please come in thru the Main Entrance and check in at the main information desk.  You need to re-schedule your appointment should you arrive 10 or more minutes late.  We strive to give you quality time with our providers, and arriving late affects you and other patients whose appointments are after yours.  Also, if you no show three or more times for appointments you may be dismissed from the clinic at the providers discretion.     Again, thank you for choosing [facility].  Our hope is that these requests will decrease the amount of time that you wait before being seen by our physicians.       _____________________________________________________________  Should you have questions after your visit to [facility], please contact our office at [phone] and follow the prompts.  Our office hours are 8:00 a.m. and 4:30 p.m. Monday - Friday.  Please note that voicemails left after 4:00 p.m. may not be returned until the following business day.  We are closed weekends and major holidays.  You do have access to a nurse 24-7, just call the main number to the clinic [phone] and do not press any options, hold on the line and a nurse will answer the phone.    For prescription refill requests, have your pharmacy contact  our office and allow 72 hours.    Due to Covid, you will need to wear a mask upon entering the hospital. If you do not have a mask, a mask will be given to you at the Main Entrance upon arrival. For doctor visits, patients may have 1 support person age 57 or older with them. For treatment visits, patients can not have anyone with them due to social distancing guidelines and our immunocompromised population.

## 2022-01-26 NOTE — Progress Notes (Signed)
[facility] [address], [place] [address]   Patient Care Team: Patient, [name] as PCP - General (General Practice) Derek Jack, MD as Medical Oncologist (Oncology) Brien Mates, RN as Oncology Nurse Navigator (Oncology)  SUMMARY OF ONCOLOGIC HISTORY: Oncology History   No history exists.    CHIEF COMPLIANT: Follow-up of history of bilateral breast cancer   INTERVAL HISTORY: [name] is a 51 y.o. female here today for follow up of her history of bilateral breast cancer. Her last visit was on 08/01/2021.   Today she reports feeling well. She reports bumps at the site of her left breast implant. She does not currently take calcium or vitamin D. She reports occasional nausea for which she takes compazine and Zofran prn. She reports occasional hot flashes.   REVIEW OF SYSTEMS:   Review of Systems  Constitutional:  Negative for appetite change and fatigue.  Respiratory:  Positive for cough.   Endocrine: Positive for hot flashes (occasional).  Musculoskeletal:  Positive for arthralgias (arms).  Psychiatric/Behavioral:  Positive for depression. The patient is nervous/anxious.   All other systems reviewed and are negative.  I have reviewed the past medical history, past surgical history, social history and family history with the patient and they are unchanged from previous note.   ALLERGIES:   is allergic to chlorhexidine, hibiclens [chlorhexidine gluconate], and povidone-iodine.   MEDICATIONS:  Current Outpatient Medications  Medication Sig Dispense Refill   acetaminophen (TYLENOL) 325 MG tablet Take 650 mg by mouth every 6 (six) hours as needed for mild pain, moderate pain, fever or headache. (Patient not taking: Reported on 08/01/2021)     AJOVY 225 MG/1.5ML SOAJ Inject into the skin every 30 (thirty) days.     betamethasone valerate (VALISONE) 0.1 % cream Apply topically 2 (two) times daily.      Butalbital-Acetaminophen 50-300 MG CAPS Take 1 capsule by mouth every 4 (four) hours as needed for headache. (Patient not taking: Reported on 08/01/2021)     cetirizine (ZYRTEC) 10 MG tablet Take 10 mg by mouth daily.     clonazePAM (KLONOPIN) 0.5 MG tablet Take 0.5 mg by mouth 2 (two) times daily as needed for anxiety.     exemestane (AROMASIN) 25 MG tablet Take 1 tablet (25 mg total) by mouth daily. 30 tablet 3   fluticasone (FLONASE) 50 MCG/ACT nasal spray Place into both nostrils daily.     gabapentin (NEURONTIN) 300 MG capsule Take 300 mg by mouth 2 (two) times daily.     lisinopril (ZESTRIL) 10 MG tablet Take 10 mg by mouth 2 (two) times daily.     meclizine (ANTIVERT) 12.5 MG tablet Take 12.5 mg by mouth daily as needed for dizziness.     Multiple Vitamin (MULTIVITAMIN) capsule Take 1 capsule by mouth daily.     ondansetron (ZOFRAN-ODT) 8 MG disintegrating tablet Take 8 mg by mouth every 8 (eight) hours as needed for nausea or vomiting.     prochlorperazine (COMPAZINE) 10 MG tablet Take 10 mg by mouth every 6 (six) hours as needed for nausea or vomiting.     QUEtiapine (SEROQUEL) 50 MG tablet Take 50 mg by mouth daily as needed (mood). (Patient not taking: Reported on 08/01/2021)     sertraline (ZOLOFT) 50 MG tablet Take 50 mg by mouth daily.     SUMAtriptan (IMITREX) 100 MG tablet Take 100 mg by mouth every 2 (two) hours as needed for migraine. May repeat in 2 hours  if headache persists or recurs.     SUMAtriptan (IMITREX) 25 MG tablet Take 25 mg by mouth every 2 (two) hours as needed for migraine. (Patient not taking: Reported on 08/01/2021)     topiramate (TOPAMAX) 25 MG tablet Take 25 mg by mouth 2 (two) times daily.     triamcinolone (KENALOG) 0.025 % cream Apply 1 application topically 2 (two) times daily. (Patient not taking: Reported on 12/28/2021)     vitamin E 180 MG (400 UNITS) capsule Take 400 Units by mouth daily.     No current facility-administered medications for this visit.      PHYSICAL EXAMINATION: Performance status (ECOG): 0 - Asymptomatic  There were no vitals filed for this visit. Wt Readings from Last 3 Encounters:  12/28/21 178 lb 9.6 oz (81 kg)  08/01/21 180 lb 3.2 oz (81.7 kg)  07/13/21 178 lb 12.8 oz (81.1 kg)   Physical Exam Vitals reviewed.  Constitutional:      Appearance: Normal appearance. She is obese.  Cardiovascular:     Rate and Rhythm: Normal rate and regular rhythm.     Pulses: Normal pulses.     Heart sounds: Normal heart sounds.  Pulmonary:     Effort: Pulmonary effort is normal.     Breath sounds: Normal breath sounds.  Chest:  Breasts:    Right: No tenderness.     Left: Skin change (2 nodules from implant capsule palpable in lower breast) and tenderness (superior to implant) present.    Neurological:     General: No focal deficit present.     Mental Status: She is alert and oriented to person, place, and time.  Psychiatric:        Mood and Affect: Mood normal.        Behavior: Behavior normal.    Breast Exam Chaperone: Thana Ates     LABORATORY DATA:  I have reviewed the data as listed CMP Latest Ref Rng & Units 01/19/2022 08/01/2021 06/25/2021  Glucose 70 - 99 mg/dL 110(H) 88 100(H)  BUN 6 - 20 mg/dL _0 Creatinine 0.44 - 1.00 mg/dL 0.81 0.70 0.62  Sodium 135 - 145 mmol/L 138 139 142  Potassium 3.5 - 5.1 mmol/L 4.0 3.7 3.6  Chloride 98 - 111 mmol/L 105 108 112(H)  CO2 22 - 32 mmol/L _1 Calcium 8.9 - 10.3 mg/dL 9.2 8.8(L) 8.4(L)  Total Protein 6.5 - 8.1 g/dL 7.0 7.6 -  Total Bilirubin 0.3 - 1.2 mg/dL 0.7 0.4 -  Alkaline Phos 38 - 126 U/L 107 117 -  AST 15 - 41 U/L 29 30 -  ALT 0 - 44 U/L 32 24 -   Lab Results  Component Value Date   CAN153 20.5 01/19/2022   CAN153 22.2 08/01/2021   Lab Results  Component Value Date   WBC 3.4 (L) 01/19/2022   HGB 14.3 01/19/2022   HCT 42.9 01/19/2022   MCV 96.8 01/19/2022   PLT 174 01/19/2022   NEUTROABS 1.6 (L) 01/19/2022    ASSESSMENT:  1.   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  Changes consistent with HPV effect.  Uterus, cervix, bilateral fallopian tubes and ovaries are negative for malignancy.  2.  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 - Non-smoker. - No family history of malignancies.   PLAN:  1.  T1CN1A right breast IDC, ER/PR positive, HER2 negative and T1CN1A left breast TNBC: - She reports palpating small nodules in the inferior margin of the left breast implant. - Physical examination shows small protrusions along the inferior margin of the left breast implant, arising in the implant.  No other suspicious nodules in the skin or subcutaneous tissues.  There is tenderness about the left breast implant in the chest wall without palpable mass.  No palpable adenopathy. - Continue exemestane which she is tolerating reasonably well.  She has mild hot flashes which are manageable. - Reviewed labs from 01/19/2022 which showed normal LFTs and CBC.  Tumor markers within normal limits. - We will make a referral to plastic surgery. - RTC 6 months for follow-up with repeat labs.  2.  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  No tingling  or numbness in the feet.  No pharmacological intervention necessary.  3.  Bone health: - We reviewed bone density results from 08/05/2021, T score -0.7.  It is in the normal range. - Vitamin D is slightly low at 29. -She was recommended to start taking calcium plus D once daily.  4.  Nausea: - She has chronic nausea since she was treated with chemotherapy. - Continue Zofran and Compazine as needed.  Breast Cancer therapy associated bone loss: I have recommended calcium, Vitamin D and weight bearing exercises.  Orders placed this encounter:  No orders of the defined types were placed in this encounter.   The patient has a good understanding of the overall plan. She agrees with it. She will call with any problems that may develop before the next visit here.  Derek Jack, MD [place] [phone]   I, Thana Ates, am acting as a scribe for Dr. Derek Jack.  I, Derek Jack MD, have reviewed the above documentation for accuracy and completeness, and I agree with the above.

## 2022-02-01 ENCOUNTER — Other Ambulatory Visit: Payer: Self-pay

## 2022-02-01 ENCOUNTER — Ambulatory Visit (INDEPENDENT_AMBULATORY_CARE_PROVIDER_SITE_OTHER): Payer: Medicare Other | Admitting: Plastic Surgery

## 2022-02-01 VITALS — Ht 63.0 in | Wt 177.4 lb

## 2022-02-01 DIAGNOSIS — T8543XA Leakage of breast prosthesis and implant, initial encounter: Secondary | ICD-10-CM | POA: Diagnosis not present

## 2022-02-01 NOTE — Progress Notes (Signed)
? ?Referring Provider ?Derek Jack, MD ?[address] ?[place],  [place] [address]  ? ?CC:  ?Chief Complaint  ?Patient presents with  ? consult  ?   ? ?Sharon Phillips is an 51 y.o. female.  ?HPI: Patient presents to discuss questions regarding the left breast implant.  She had bilateral breast cancers which were treated with mastectomy and 2019.  She had tissue expanders placed at that time.  She subsequently had radiation and then had the expander switched out for implants.  She says her implants are gel.  She is bothered by 2 bumps that she is able to palpate in the inferior aspect of the left breast.  She was sent by her oncologist.  Her initial surgeries were done in [place] but she has transferred her care here to [facility].  She is also complains of pain in the superomedial aspect of the implant on the left side that she says has been present since her mastectomy.  She does take gabapentin which has helped a little bit. ? ?Allergies  ?Allergen Reactions  ? Chlorhexidine Itching  ?  Other reaction(s): Unknown  ? Hibiclens [Chlorhexidine Gluconate]   ? Povidone-Iodine   ?  Other reaction(s): itching, Unknown  ? ? ?Outpatient Encounter Medications as of 02/01/2022  ?Medication Sig  ? acetaminophen (TYLENOL) 325 MG tablet Take 650 mg by mouth every 6 (six) hours as needed for mild pain, moderate pain, fever or headache.  ? AJOVY 225 MG/1.5ML SOAJ Inject into the skin every 30 (thirty) days.  ? betamethasone valerate (VALISONE) 0.1 % cream Apply topically 2 (two) times daily.  ? Butalbital-Acetaminophen 50-300 MG CAPS Take 1 capsule by mouth every 4 (four) hours as needed for headache.  ? cetirizine (ZYRTEC) 10 MG tablet Take 10 mg by mouth daily.  ? clonazePAM (KLONOPIN) 0.5 MG tablet Take 0.5 mg by mouth 2 (two) times daily as needed for anxiety.  ? Erenumab-aooe (AIMOVIG) 140 MG/ML SOAJ Aimovig Autoinjector 140 mg/mL subcutaneous auto-injector ? INJECT 1 SYRINGE SUBCUTANEOUSLY ONCE EVERY MONTH  ? exemestane  (AROMASIN) 25 MG tablet Take 1 tablet (25 mg total) by mouth daily.  ? fluticasone (FLONASE) 50 MCG/ACT nasal spray Place into both nostrils daily.  ? gabapentin (NEURONTIN) 300 MG capsule Take 300 mg by mouth 2 (two) times daily.  ? lisinopril (ZESTRIL) 10 MG tablet Take 10 mg by mouth 2 (two) times daily.  ? meclizine (ANTIVERT) 12.5 MG tablet Take 12.5 mg by mouth daily as needed for dizziness.  ? metoprolol succinate (TOPROL-XL) 25 MG 24 hr tablet 1 tablet  ? Multiple Vitamin (MULTIVITAMIN) capsule Take 1 capsule by mouth daily.  ? NURTEC 75 MG TBDP Take by mouth.  ? ondansetron (ZOFRAN-ODT) 8 MG disintegrating tablet Take 8 mg by mouth every 8 (eight) hours as needed for nausea or vomiting.  ? prochlorperazine (COMPAZINE) 10 MG tablet Take 10 mg by mouth every 6 (six) hours as needed for nausea or vomiting.  ? QUEtiapine (SEROQUEL) 50 MG tablet Take 50 mg by mouth daily as needed (mood).  ? rosuvastatin (CRESTOR) 5 MG tablet Take 5 mg by mouth daily.  ? sertraline (ZOLOFT) 50 MG tablet Take 50 mg by mouth daily.  ? SUMAtriptan (IMITREX) 100 MG tablet Take 100 mg by mouth every 2 (two) hours as needed for migraine. May repeat in 2 hours if headache persists or recurs.  ? topiramate (TOPAMAX) 25 MG tablet Take 25 mg by mouth 2 (two) times daily.  ? triamcinolone (KENALOG) 0.025 %  cream Apply 1 application topically 2 (two) times daily.  ? venlafaxine XR (EFFEXOR-XR) 150 MG 24 hr capsule Take 150 mg by mouth daily.  ? vitamin E 180 MG (400 UNITS) capsule Take 400 Units by mouth daily.  ? ?No facility-administered encounter medications on file as of 02/01/2022.  ?  ? ?Past Medical History:  ?Diagnosis Date  ? Anxiety   ? Cancer [facility])   ? Depression   ? High cholesterol   ? Hypertension   ? ? ?Past Surgical History:  ?Procedure Laterality Date  ? ABDOMINAL HYSTERECTOMY    ? CENTRAL VENOUS CATHETER INSERTION Right 06/27/2021  ? Procedure: INSERTION CENTRAL LINE ADULT, tunneled;  Surgeon: Virl Cagey, MD;   Location: [facility];  Service: General;  Laterality: Right;  ? MASTECTOMY    ? bilateral  ? ? ?Family History  ?Problem Relation Age of Onset  ? Breast cancer Maternal Aunt   ? Ovarian cancer Maternal Aunt   ? ? ?Social History  ? ?Social History Narrative  ? Not on file  ?  ? ?Review of Systems ?General: Denies fevers, chills, weight loss ?CV: Denies chest pain, shortness of breath, palpitations ? ?Physical Exam ?Vitals with BMI 02/01/2022 01/26/2022 12/28/2021  ?Height 5\' 3"  5' 2.992" 5\' 4"   ?Weight 177 lbs 6 oz 180 lbs 178 lbs 10 oz  ?BMI 31.43 31.89 30.64  ?Systolic - 517 616  ?Diastolic - 87 073  ?Pulse - 98 92  ?  ?General:  No acute distress,  Alert and oriented, Non-Toxic, Normal speech and affect ?Examination shows well-healed mastectomy scars.  She has reasonable symmetric result.  Mild evidence of radiation changes on both sides.  The nodules that she points to feels like wrinkles in the implant capsule.  No overlying skin changes in those areas.  She is tender superomedial to the implant on the left side which seems hypersensitive and she describes it as a burning pain. ? ?Assessment/Plan ?Patient presents with lumps around the left breast implant which does feel like some rippling of the implant itself.  I do not have any of her old records but we discussed getting an MRI to ensure the implants were intact.  If she does have gel implants it is possible there could be a silent rupture.  We will plan to get this and then I will see her back.  She is content and is in agreement with the plan. ? ?Cindra Presume ?02/01/2022, 10:03 AM  ? ? ?  ?

## 2022-02-10 ENCOUNTER — Telehealth: Payer: Self-pay | Admitting: Genetic Counselor

## 2022-02-10 ENCOUNTER — Encounter: Payer: Self-pay | Admitting: Genetic Counselor

## 2022-02-10 DIAGNOSIS — Z1509 Genetic susceptibility to other malignant neoplasm: Secondary | ICD-10-CM | POA: Insufficient documentation

## 2022-02-10 DIAGNOSIS — Z1379 Encounter for other screening for genetic and chromosomal anomalies: Secondary | ICD-10-CM | POA: Insufficient documentation

## 2022-02-10 NOTE — Telephone Encounter (Signed)
I contacted Ms. Penaflor to discuss her genetic testing results. 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 Detailed clinic note to follow. ? ?The test report has been scanned into EPIC and is located under the Molecular Pathology section of the Results Review tab.  A portion of the result report is included below for reference.  ? ?Lucille Passy, MS, [place] ?Genetic Counselor ?[email or web address] ?(P) [phone]  ? ? ?

## 2022-02-14 ENCOUNTER — Encounter: Payer: Self-pay | Admitting: Genetic Counselor

## 2022-02-14 ENCOUNTER — Ambulatory Visit: Payer: Self-pay | Admitting: Genetic Counselor

## 2022-02-14 DIAGNOSIS — Z1379 Encounter for other screening for genetic and chromosomal anomalies: Secondary | ICD-10-CM

## 2022-02-14 DIAGNOSIS — Z1501 Genetic susceptibility to malignant neoplasm of breast: Secondary | ICD-10-CM

## 2022-02-14 NOTE — Progress Notes (Signed)
HPI:   ?Sharon Phillips was previously seen in the [place] clinic due to a personal and family history of cancer and concerns regarding a hereditary predisposition to cancer. Please refer to our prior cancer genetics clinic note for more information regarding our discussion, assessment and recommendations, at the time. Ms. Weins recent genetic test results were disclosed to her, as were recommendations warranted by these results. These results and recommendations are discussed in more detail below. ? ?CANCER HISTORY:  ?Oncology History  ?Bilateral breast cancer ([place])  ?08/01/2021 Initial Diagnosis  ? Bilateral breast cancer ([place]) ?  ? Genetic Testing  ? 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 Report date is 02/06/2022. ? ?The CancerNext gene panel offered by [employer] includes sequencing, rearrangement analysis, and RNA analysis for the following 36 genes:   APC, ATM, AXIN2, BARD1, BMPR1A, BRCA1, BRCA2, BRIP1, CDH1, CDK4, CDKN2A, CHEK2, DICER1, HOXB13, EPCAM, GREM1, MLH1, MSH2, MSH3, MSH6, MUTYH, NBN, NF1, NTHL1, PALB2, PMS2, POLD1, POLE, PTEN, RAD51C, RAD51D, RECQL, SMAD4, SMARCA4, STK11, and TP53.  ?  ? ? ?FAMILY HISTORY:  ?We obtained a detailed, 4-generation family history.  Significant diagnoses are listed below: ?     ?Family History  ?Problem Relation Age of Onset  ? Breast cancer Maternal Aunt    ? Ovarian cancer Maternal Aunt    ?  ?  ?Ms. Armand reports a maternal aunt diagnosed with breast cancer and a second maternal aunt diagnosed with ovarian cancer, both are still living. Her mother had a premenopausal TAH/BSO. Of note, Ms. Bracknell has limited information about her family medical history. Ms. Froh is unaware of previous family history of genetic testing for hereditary cancer risks. There is no reported Ashkenazi Jewish ancestry.  ? ?GENETIC TEST RESULTS:  ?Ms. Reilly tested positive for a single pathogenic variant  (harmful genetic change) in the PALB2 gene. Specifically, this variant is  [id number]. Of note, the remaining 35 genes analyzed were negative.  ? ?The test report has been scanned into EPIC and is located under the Molecular Pathology section of the Results Review tab.  A portion of the result report is included below for reference. Genetic testing reported out on 02/06/2022.  ? ? ? ? ? ?Clinical Information: ?The cancers associated with PALB2 are: ? ?Female breast cancer, 41-60% risk ?Ovarian cancer, 3-5% risk ?Pancreatic cancer, 5-10% risk ?Men with PALB2 mutations may have an increased risk for female breast cancer ?  ? ?Management Recommendations: ? ?Breast Screening/Risk Reduction: ? ?Women: ?Breast cancer screening includes: ?Breast awareness beginning at age 18 ?Monthly self-breast examination beginning at age 67 ?Annual clinical breast examinations beginning at age 67 ?Annual mammogram beginning at age 71 with consideration of tomosynthesis ?Annual breast MRI with contrast beginning at age 22 ?Discuss the option of a prophylactic risk-reducing mastectomy, manage based on family history.  ?  ?Males: ?Consider breast cancer screening such as patient breast awareness education and clinical breast exams ? ?Gynecological Cancer Screening/Risk Reduction: ?Consider a prophylactic bilateral salpingo-oophorectomy (BSO) at age 44 ?  ?Pancreatic Cancer Screening/Risk Reduction: ?Avoid smoking, heavy alcohol use, and obesity. ?Pancreatic cancer screening in patients with a first- or second-degree relative affected with pancreatic cancer. Ideally, screening should be performed in experienced centers utilizing a multidisciplinary approach under research conditions. Recommended screening includes annual endoscopic ultrasound and/or MRI of the pancreas starting at age 16 or 71 years younger than the earliest age of pancreatic cancer diagnosis in the family. ?At Ms. Falck's request, we  will place a referral for Dr.  Bryan Lemma at [facility] due to the limited information about her family medical history.  ? ?Additional Considerations: ?Children who inherit two PALB2 mutations (one from each parent) have a rare form of Fanconi anemia. Fanconi anemia is characterized by bone marrow failure with variable additional anomalies, which often include short stature, abnormal skin pigmentation, abnormal thumbs, malformations of the skeletal and central nervous systems, and developmental delay. Genetic testing for a partner of an individual with a PALB2 mutation may be appropriate for reproductive decision making. ?Patients of reproductive age should be made aware of options for prenatal diagnosis and assisted reproduction including pre-implantation genetic diagnosis. ? ?This information is based on current understanding of the gene and may change in the future. ? ?Implications for Family Members: ?Hereditary predisposition to cancer due to pathogenic variants in the PALB2 gene has autosomal dominant inheritance. This means that an individual with a pathogenic variant has a 50% chance of passing the condition on to his/her offspring. Identification of a pathogenic variant allows for the recognition of at-risk relatives who can pursue testing for the familial variant. ? ?Family members are encouraged to consider genetic testing for this familial pathogenic variant. As there are generally no childhood cancer risks associated with pathogenic variants in the PALB2 gene, individuals in the family are not recommended to have testing until they reach at least 50 years of age. They may contact our office at [phone] for more information or to schedule an appointment.  Complimentary testing for the familial variant is available for 90 days from 02/06/2022.  Family members who live outside of the area are encouraged to find a genetic counselor in their area by visiting: [email or web address]. ? ?Resources: ?FORCE  (Facing Our Risk of Cancer Empowered) is a resource for those with a hereditary predisposition to develop cancer.  FORCE provides information about risk reduction, advocacy, legislation, and clinical trials.  Additionally, FORCE provides a platform for collaboration and support; which includes: peer navigation, message boards, local support groups, a toll-free helpline, research registry and recruitment, advocate training, published medical research, webinars, brochures, mastectomy photos, and more.  For more information, visit www.facingourrisk.org  ? ?Our contact number was provided. Ms. Blew questions were answered to her satisfaction, and she knows she is welcome to call us at anytime with additional questions or concerns.  ? ?Lucille Passy, MS, [place] ?Genetic Counselor ?[email or web address] ?(P) [phone] ? ? ?

## 2022-02-15 DIAGNOSIS — F419 Anxiety disorder, unspecified: Secondary | ICD-10-CM | POA: Insufficient documentation

## 2022-02-15 DIAGNOSIS — F418 Other specified anxiety disorders: Secondary | ICD-10-CM | POA: Insufficient documentation

## 2022-02-15 DIAGNOSIS — G43709 Chronic migraine without aura, not intractable, without status migrainosus: Secondary | ICD-10-CM | POA: Insufficient documentation

## 2022-02-20 ENCOUNTER — Telehealth: Payer: Self-pay

## 2022-02-20 NOTE — Telephone Encounter (Signed)
Appointment is scheduled with patient on 03/02/22 at 10.30am. ?

## 2022-02-20 NOTE — Telephone Encounter (Signed)
-----  Message from Lavena Bullion, DO sent at 02/20/2022 12:53 PM EDT ----- ?Regarding: RE: PALB2 mutation ?Thanks, Mel Almond. No problem, would be happy to get her in and discuss.  ? ?Cicily Bonano, can you please schedule OV with me? Thanks! ? ?VC ?----- Message ----- ?From: Katheren Shams, Counselor ?Sent: 02/17/2022  12:50 PM EDT ?To: Lavena Bullion, DO ?Subject: PALB2 mutation                                ? ?Dr. Bryan Lemma,  ? ?I would like to refer Ms. Wendt for pancreatic cancer screening due to her PALB2 gene mutation. Ms. Record is aware that she does not meet criteria for screening because she does not have a known family history of pancreatic cancer. However, she requested a referral because she has very limited information about her family medical history.  ? ?Thanks! ?Mel Almond  ? ? ?

## 2022-02-22 ENCOUNTER — Ambulatory Visit: Payer: Medicare Other | Admitting: Gastroenterology

## 2022-03-02 ENCOUNTER — Other Ambulatory Visit (INDEPENDENT_AMBULATORY_CARE_PROVIDER_SITE_OTHER): Payer: Medicare Other

## 2022-03-02 ENCOUNTER — Ambulatory Visit (INDEPENDENT_AMBULATORY_CARE_PROVIDER_SITE_OTHER): Payer: Medicare Other | Admitting: Gastroenterology

## 2022-03-02 ENCOUNTER — Encounter: Payer: Self-pay | Admitting: Gastroenterology

## 2022-03-02 VITALS — BP 122/64 | HR 95 | Ht 63.0 in | Wt 174.2 lb

## 2022-03-02 DIAGNOSIS — Z1501 Genetic susceptibility to malignant neoplasm of breast: Secondary | ICD-10-CM

## 2022-03-02 DIAGNOSIS — Z1589 Genetic susceptibility to other disease: Secondary | ICD-10-CM | POA: Diagnosis not present

## 2022-03-02 DIAGNOSIS — Z9189 Other specified personal risk factors, not elsewhere classified: Secondary | ICD-10-CM

## 2022-03-02 DIAGNOSIS — Z1509 Genetic susceptibility to other malignant neoplasm: Secondary | ICD-10-CM

## 2022-03-02 LAB — HEMOGLOBIN A1C: Hgb A1c MFr Bld: 6 % (ref 4.6–6.5)

## 2022-03-02 NOTE — Progress Notes (Signed)
? ? ?          ?Chief Complaint: Pancreatic Cancer Screening ? ? ?Referring Provider:     Katheren Shams [facility]) ? ? ?HPI:   ? ? ?Sharon Phillips is a 51 y.o. female with a history of bilateral breast cancer s/p bilateral mastectomy 05/2018, radiation, chemotherapy, TAH/BSO, referred to the Gastroenterology Clinic for evaluation of Pancreatic Cancer screening due to [place] heterozygous gene mutation. ? ? ?Family history and genetics history reviewed along with recent notes from the [facility].  Family history notable for the following: ?- Maternal aunt: Breast cancer ?- Maternal aunt: Ovarian cancer ?- No known family history of Pancreatic Cancer ?- 7 brothers, both cancer free (1 deceased at young age from accident) ?- Mother and father both alive and cancer free ?- 4 children, all cancer free ?- All 4 grandparents died "of old age" and no known cancer to her knowledge ?- She otherwise has limited information about the rest of her family medical history ? ? ?GENETIC TEST RESULTS:  ?Ms. Flanery tested positive for a single pathogenic variant (harmful genetic change) in the PALB2 gene. Specifically, this variant is  [id number]. Of note, the remaining 35 genes analyzed were negative. (see note dated 02/14/2022 for full result details). ? ?No recent abdominal imaging for review.  ? ?Separately, she was recently seen in [place] [facility] to establish care for known hx of NAFLD.  Prior to that, was followed by GI in [place], [place] prior to moving to [facility]  History of chronically elevated ALP.  Prior testing includes the following: ?- FibroSure: 0, no fibrosis ?- ASMA 32, otherwise negative serologic work-up (hepatitis B/C, ceruloplasmin, A1 AT, iron, TTG, AMA) ?- Liver biopsy 09/2020 demonstrating fatty liver mild inflammation ?- 01/2022: Normal liver enzymes including ALP 107, T. bili 0.7 ? ?Endoscopic History: ?- Colonoscopy (02/12/2020): 1 small hyperplastic polyp removed with biopsy forceps, internal  hemorrhoids.  Repeat in 10 years ? ?No prior EGD. No GI sxs.  ? ? ? ?  Latest Ref Rng & Units 01/19/2022  ?  8:54 AM 08/01/2021  ?  2:21 PM 06/25/2021  ?  4:54 AM  ?CBC  ?WBC 4.0 - 10.5 K/uL 3.4   3.8   4.5    ?Hemoglobin 12.0 - 15.0 g/dL 14.3   13.1   11.2    ?Hematocrit 36.0 - 46.0 % 42.9   41.1   34.3    ?Platelets 150 - 400 K/uL 174   176   151    ? ? ? ?  Latest Ref Rng & Units 01/19/2022  ?  8:54 AM 08/01/2021  ?  2:21 PM 06/25/2021  ?  4:54 AM  ?CMP  ?Glucose 70 - 99 mg/dL 110   88   100    ?BUN 6 - 20 mg/dL 15   14   7     ?Creatinine 0.44 - 1.00 mg/dL 0.81   0.70   0.62    ?Sodium 135 - 145 mmol/L 138   139   142    ?Potassium 3.5 - 5.1 mmol/L 4.0   3.7   3.6    ?Chloride 98 - 111 mmol/L 105   108   112    ?CO2 22 - 32 mmol/L 24   26   24     ?Calcium 8.9 - 10.3 mg/dL 9.2   8.8   8.4    ?Total Protein 6.5 - 8.1 g/dL 7.0   7.6     ?Total Bilirubin 0.3 -  1.2 mg/dL 0.7   0.4     ?Alkaline Phos 38 - 126 U/L 107   117     ?AST 15 - 41 U/L 29   30     ?ALT 0 - 44 U/L 32   24     ? ? ? ? ?Past Medical History:  ?Diagnosis Date  ? Anxiety   ? Cancer [facility])   ? Depression   ? High cholesterol   ? Hypertension   ? ? ? ?Past Surgical History:  ?Procedure Laterality Date  ? ABDOMINAL HYSTERECTOMY    ? CENTRAL VENOUS CATHETER INSERTION Right 06/27/2021  ? Procedure: INSERTION CENTRAL LINE ADULT, tunneled;  Surgeon: Virl Cagey, MD;  Location: [facility];  Service: General;  Laterality: Right;  ? MASTECTOMY    ? bilateral  ? ?Family History  ?Problem Relation Age of Onset  ? Breast cancer Maternal Aunt   ? Ovarian cancer Maternal Aunt   ? ?Social History  ? ?Tobacco Use  ? Smoking status: Never  ? Smokeless tobacco: Never  ?Vaping Use  ? Vaping Use: Never used  ?Substance Use Topics  ? Alcohol use: Never  ? Drug use: Never  ? ?Current Outpatient Medications  ?Medication Sig Dispense Refill  ? acetaminophen (TYLENOL) 325 MG tablet Take 650 mg by mouth every 6 (six) hours as needed for mild pain, moderate pain, fever or headache.     ? AJOVY 225 MG/1.5ML SOAJ Inject into the skin every 30 (thirty) days.    ? betamethasone valerate (VALISONE) 0.1 % cream Apply topically 2 (two) times daily.    ? Butalbital-Acetaminophen 50-300 MG CAPS Take 1 capsule by mouth every 4 (four) hours as needed for headache.    ? cetirizine (ZYRTEC) 10 MG tablet Take 10 mg by mouth daily.    ? clonazePAM (KLONOPIN) 0.5 MG tablet Take 0.5 mg by mouth 2 (two) times daily as needed for anxiety.    ? Erenumab-aooe (AIMOVIG) 140 MG/ML SOAJ Aimovig Autoinjector 140 mg/mL subcutaneous auto-injector ? INJECT 1 SYRINGE SUBCUTANEOUSLY ONCE EVERY MONTH    ? exemestane (AROMASIN) 25 MG tablet Take 1 tablet (25 mg total) by mouth daily. 30 tablet 3  ? fluticasone (FLONASE) 50 MCG/ACT nasal spray Place into both nostrils daily.    ? gabapentin (NEURONTIN) 300 MG capsule Take 300 mg by mouth 2 (two) times daily.    ? lisinopril (ZESTRIL) 10 MG tablet Take 10 mg by mouth 2 (two) times daily.    ? meclizine (ANTIVERT) 12.5 MG tablet Take 12.5 mg by mouth daily as needed for dizziness.    ? metoprolol succinate (TOPROL-XL) 25 MG 24 hr tablet 1 tablet    ? Multiple Vitamin (MULTIVITAMIN) capsule Take 1 capsule by mouth daily.    ? NURTEC 75 MG TBDP Take by mouth.    ? ondansetron (ZOFRAN-ODT) 8 MG disintegrating tablet Take 8 mg by mouth every 8 (eight) hours as needed for nausea or vomiting.    ? prochlorperazine (COMPAZINE) 10 MG tablet Take 10 mg by mouth every 6 (six) hours as needed for nausea or vomiting.    ? QUEtiapine (SEROQUEL) 50 MG tablet Take 50 mg by mouth daily as needed (mood).    ? rosuvastatin (CRESTOR) 5 MG tablet Take 5 mg by mouth daily.    ? sertraline (ZOLOFT) 50 MG tablet Take 50 mg by mouth daily.    ? SUMAtriptan (IMITREX) 100 MG tablet Take 100 mg by mouth every 2 (two) hours as needed for  migraine. May repeat in 2 hours if headache persists or recurs.    ? topiramate (TOPAMAX) 25 MG tablet Take 25 mg by mouth 2 (two) times daily.    ? triamcinolone  (KENALOG) 0.025 % cream Apply 1 application topically 2 (two) times daily.    ? venlafaxine XR (EFFEXOR-XR) 150 MG 24 hr capsule Take 150 mg by mouth daily.    ? vitamin E 180 MG (400 UNITS) capsule Take 400 Units by mouth daily.    ? ?No current facility-administered medications for this visit.  ? ?Allergies  ?Allergen Reactions  ? Chlorhexidine Itching  ?  Other reaction(s): Unknown  ? Hibiclens [Chlorhexidine Gluconate]   ? Povidone-Iodine   ?  Other reaction(s): itching, Unknown  ? ? ? ?Review of Systems: ?All systems reviewed and negative except where noted in HPI.  ? ? ? ?Physical Exam:   ? ?Wt Readings from Last 3 Encounters:  ?03/02/22 174 lb 4 oz (79 kg)  ?02/01/22 177 lb 6.4 oz (80.5 kg)  ?01/26/22 180 lb (81.6 kg)  ? ? ?BP 122/64 Comment: Right leg  Pulse 95   Ht 5' 3"  (1.6 m)   Wt 174 lb 4 oz (79 kg)   SpO2 98%   BMI 30.87 kg/m?  ?Constitutional:  Pleasant, in no acute distress. ?Psychiatric: Normal mood and affect. Behavior is normal. ?EENT: Pupils normal.  Conjunctivae are normal. No scleral icterus. ?Neck supple. No cervical LAD. ?Cardiovascular: Normal rate, regular rhythm. No edema ?Pulmonary/chest: Effort normal and breath sounds normal. No wheezing, rales or rhonchi. ?Abdominal: Soft, nondistended, nontender. Bowel sounds active throughout. There are no masses palpable. No hepatomegaly. ?Neurological: Alert and oriented to person place and time. ?Skin: Skin is warm and dry. No rashes noted. ? ? ?ASSESSMENT AND PLAN;  ? ?1) Genetic mutation of PALB2 ?From most recent [place] and AGA guidelines, we discussed screening for pancreatic cancer recommendations with regards to her individual PALB2 gene mutation and what is known about her family history.  With regards to her gene mutation, guidelines recommend the following:  ? ?-Carriers of a germline BRCA2, BRCA1, p16, PALB2, ATM, MLH1, MSH2, or MSH6 (ie, HNPCC) gene mutation with at least one affected first-degree  relative ? ?We had a long conversation regarding her risk of Pancreatic Cancer today.  I explained that while she does not technically meet the [place] guidelines for early/increased risk screening, she is highly conce

## 2022-03-02 NOTE — Patient Instructions (Signed)
If you are age 50 or older, your body mass index should be between 23-30. Your Body mass index is 30.87 kg/m?Marland Kitchen If this is out of the aforementioned range listed, please consider follow up with your Primary Care Provider. ? ?If you are age 71 or younger, your body mass index should be between 19-25. Your Body mass index is 30.87 kg/m?Marland Kitchen If this is out of the aformentioned range listed, please consider follow up with your Primary Care Provider.  ? ?__________________________________________________________ ? ?The [facility] providers would like to encourage you to use [facility] to communicate with providers for non-urgent requests or questions.  Due to long hold times on the telephone, sending your provider a message by [facility] may be a faster and more efficient way to get a response.  Please allow 48 business hours for a response.  Please remember that this is for non-urgent requests.  ? ? ?Please go to the lab on the 2nd floor [address] before you leave the office today.  ? ? ?You have been scheduled for an MRI at [facility] on 03/09/2022. Your appointment time is 9:00am. Please arrive 30 minutes prior to your appointment time for registration purposes. Please make certain not to have anything to eat or drink 6 hours prior to your test. In addition, if you have any metal in your body, have a pacemaker or defibrillator, please be sure to let your ordering physician know. This test typically takes 45 minutes to 1 hour to complete. Should you need to reschedule, please call [phone] to do so. ? ? ? ? ? ? ?We want to thank you for trusting [facility] with your care. All of our staff and providers value the relationships we have built with our patients, and it is an honor to care for you.  ? ?We are writing to let you know that [facility] will close on Apr 17, 2022, and we invite you to continue to see Dr. Carmell Austria and Gerrit Heck at the [facility] [place] office location. We are consolidating our serices at these [facility] practices to better provide care. Our office staff will work with you to ensure a seamless transition.  ? ?Gerrit Heck, DO -Dr. Bryan Lemma will be movig to [facility] at [address] [place], [place] [address], effective Apr 17, 2022.  Contact [phone] to schedule an appointment with him.  ? ?Carmell Austria, MD- Dr. Lyndel Safe will be movig to [facility] at [address], [place], [place] [address], effective Apr 17, 2022.  Contact [phone] to schedule an appointment with him.  ? ?Requesting Medical Records ?If you need to request your medical records, please follow the instructions below. Your medical records are confidential, and a copy can be transferred to another provider or released to you or another person you designate only with your permission. ? ?There are several ways to request your medical records: ?Requests for medical records can be submitted through our practice.   ?You can also request your records electronically, in your MyChart account by selecting the ?Request Health Records? tab.  ?If you need additional information on how to request records, please go to [email or web address]/, choose Patient Information, then select Request Medical Records. ?To make an appointment or if you have any questions about your health care needs, please contact our office at [phone] and one of our staff members will be glad to assist you. ?[facility] is committed to providing exceptional care for  you and our community. Thank you for allowing [place] to serve your health care needs. ?Sincerely, ? ?Windy Canny, Director [facility] ? also offers convenient virtual care options. Sore throat? Sinus problems? Cold or flu symptoms? Get care from the comfort of home with [facility] Video Visits and e-Visits. Learn more about the non-emergency conditions treated and start  your virtual visit at [email or web address] ? ? ?Due to recent changes in healthcare laws, you may see the results of your imaging and laboratory studies on MyChart before your provider has had a chance to review them.  We understand that in some cases there may be results that are confusing or concerning to you. Not all laboratory results come back in the same time frame and the provider may be waiting for multiple results in order to interpret others.  Please give [place] 48 hours in order for your provider to thoroughly review all the results before contacting the office for clarification of your results.  ? ?Thank you for choosing me and [facility]. ? ?Gerrit Heck, D.O. ? ?

## 2022-03-03 ENCOUNTER — Ambulatory Visit (HOSPITAL_COMMUNITY)
Admission: RE | Admit: 2022-03-03 | Discharge: 2022-03-03 | Disposition: A | Discharge status: Home / Self Care | Payer: Medicare Other | Source: Ambulatory Visit | Attending: Plastic Surgery | Admitting: Plastic Surgery

## 2022-03-03 ENCOUNTER — Other Ambulatory Visit: Payer: Self-pay | Admitting: Plastic Surgery

## 2022-03-03 DIAGNOSIS — T8543XA Leakage of breast prosthesis and implant, initial encounter: Secondary | ICD-10-CM

## 2022-03-03 IMAGING — MR MR BREAST BILAT W/O CM
9 series · 44 of 48 positions shown · non-contrast
Comparison: None

CLINICAL DATA: 50-year-old female for evaluation of breast implant
integrity. History of bilateral mastectomies and implants placed in
[M8]. Patient has palpable fullness in the LOWER LEFT breast and
pain in the UPPER INNER LEFT breast.

EXAM:
BILATERAL BREAST MRI  WITHOUT CONTRAST
TECHNIQUE: Multiplanar, multisequence MR images of both breasts without
contrast. The exam was used to evaluate implant integrity. Because
of the sequences used and the lack of intravenous contrast, this
study is not diagnostic for breast lesions.

[Series 3: T2 · axial · left · 3.0mm · 1.12mm/px · z∈[-94,+68]mm · 4 of 55 slices shown]
[im 1/55]
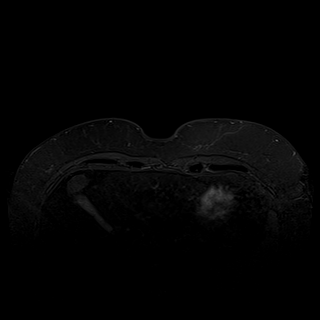
[im 19/55]
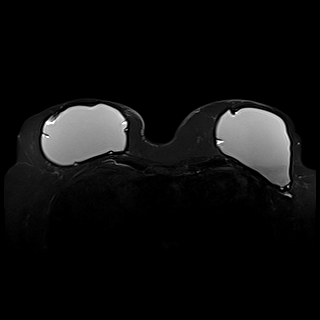
[im 37/55]
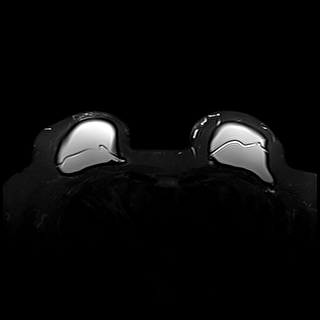
[im 55/55]
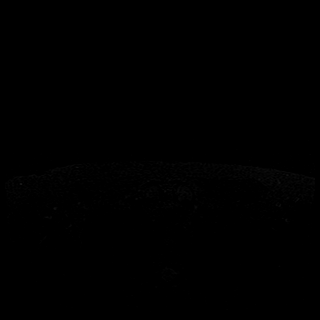

[Series 4: T1 fat-sat · axial · left · 1.2mm · 0.80mm/px · z∈[-76,+47]mm · 8 of 104 slices shown]
[im 1/104]
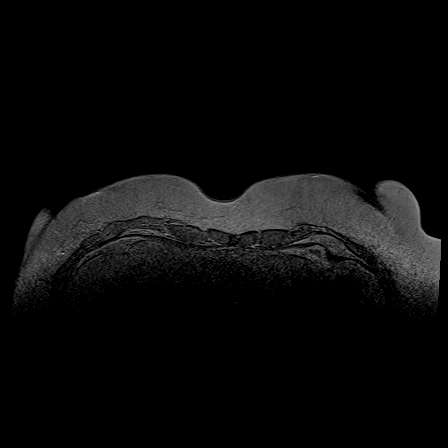
[im 15/104]
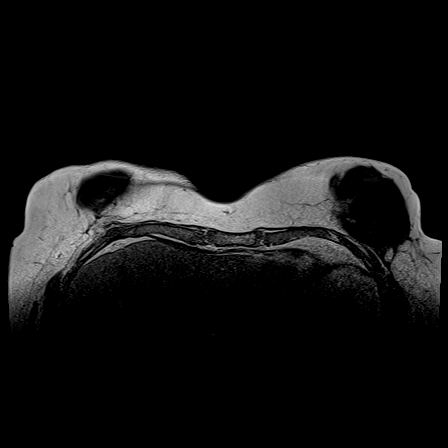
[im 30/104]
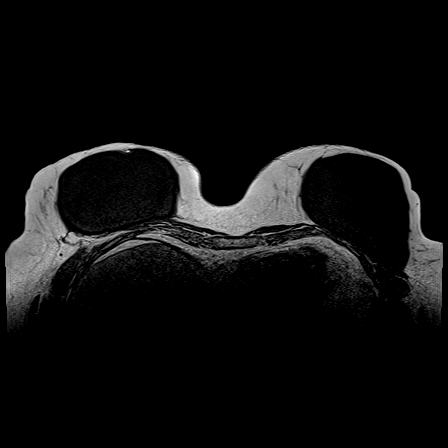
[im 45/104]
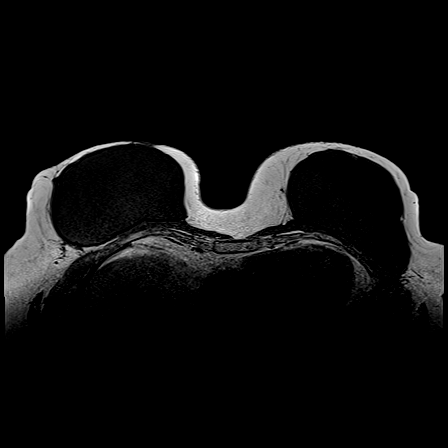
[im 59/104]
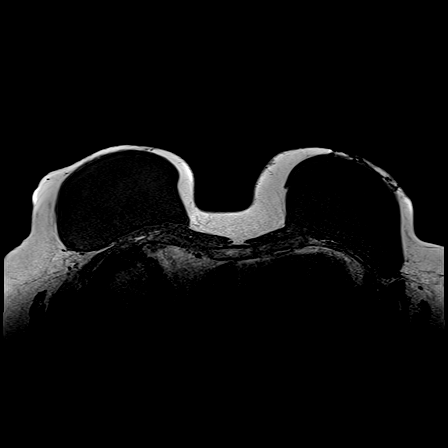
[im 74/104]
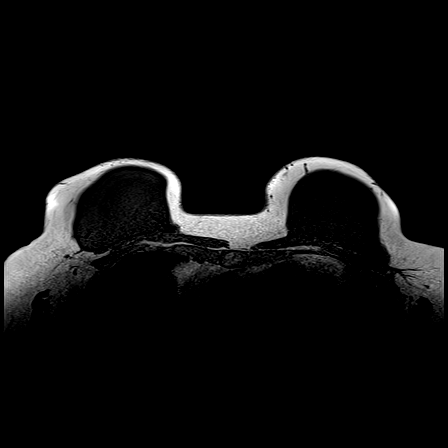
[im 89/104]
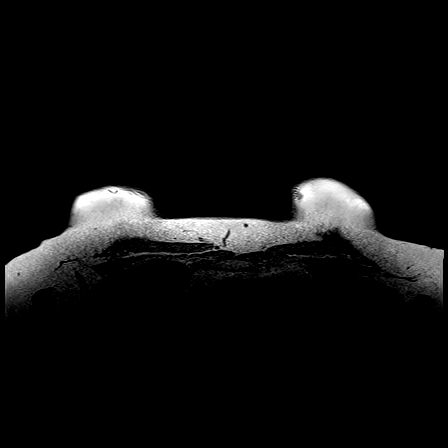
[im 104/104]
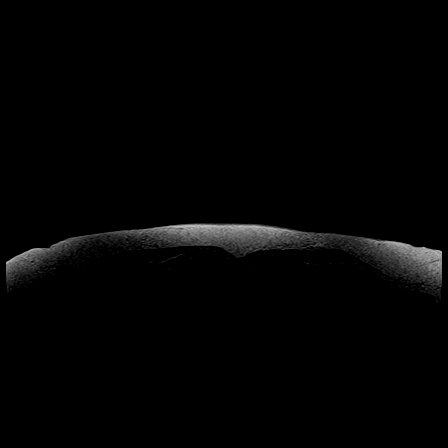

[Series 5: tirm_ws_tra_silicone_bilateral · axial · left · 4.0mm · 0.94mm/px · z∈[-102,+70]mm · 4 of 44 slices shown]
[im 1/44]
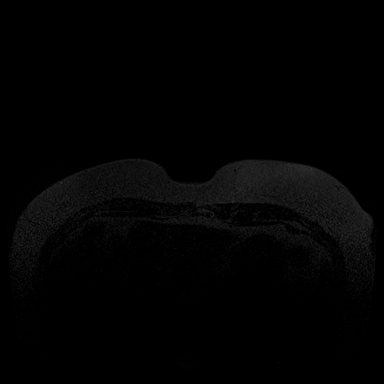
[im 15/44]
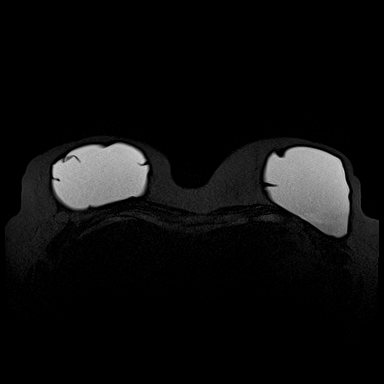
[im 29/44]
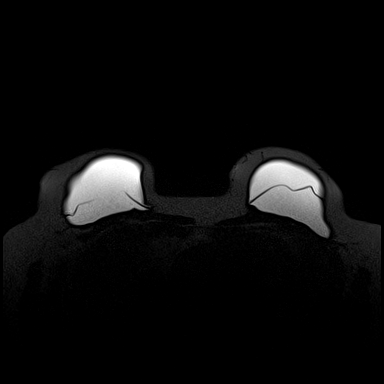
[im 44/44]
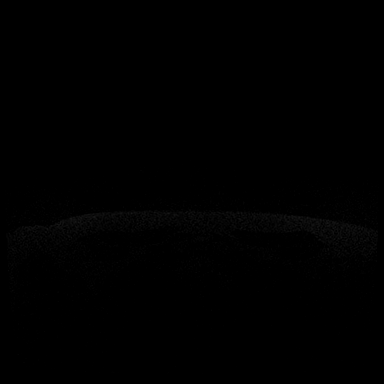

[Series 6: stir_ws_sag_silicone_left · sagittal · left · 4.0mm · 0.86mm/px · 2 of 28 slices shown]
[im 1/28]
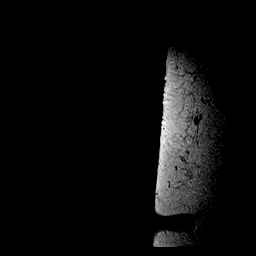
[im 28/28]
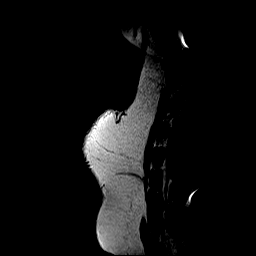

[Series 7: stir_ws_sag_silicone_right · sagittal · left · 4.0mm · 0.86mm/px · 2 of 28 slices shown]
[im 1/28]
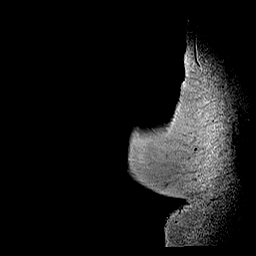
[im 28/28]
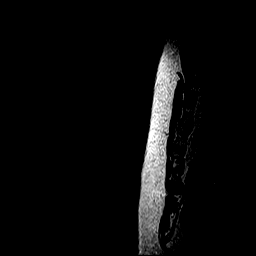

[Series 8: T1 dynamic · sagittal · left · 1.2mm · 0.94mm/px · 10 of 128 slices shown (1 of 2)]
[im 1/128]
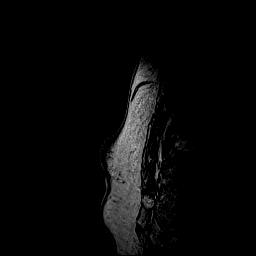
[im 13/128]
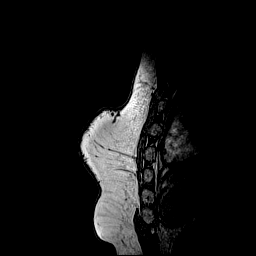
[im 26/128]
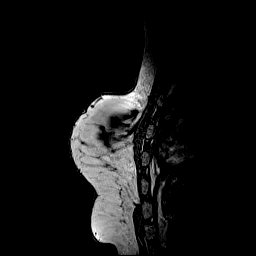
[im 39/128]
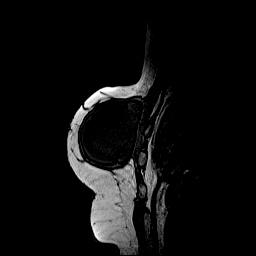
[im 51/128]
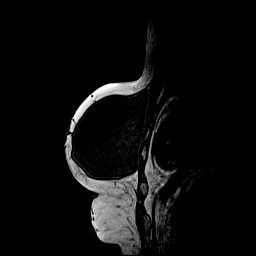
[im 64/128]
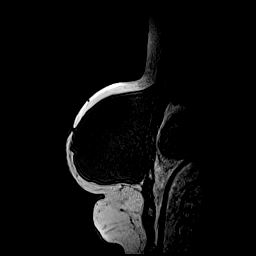
[im 77/128]
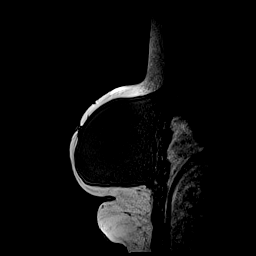
[im 89/128]
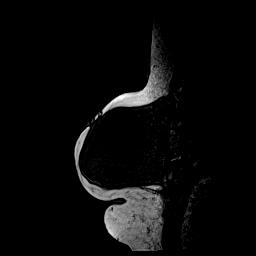
[im 102/128]
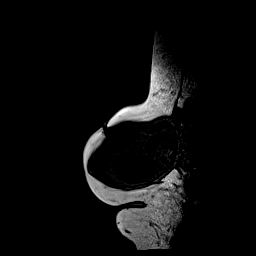
[im 128/128]
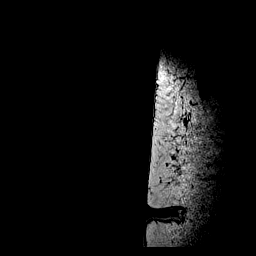

[Series 9: T1 dynamic · sagittal · left · 1.2mm · 0.94mm/px · 8 of 128 slices shown (2 of 2)]
[im 1/128]
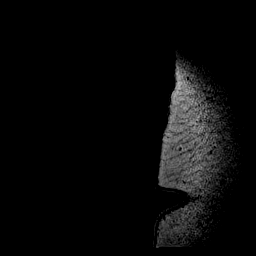
[im 26/128]
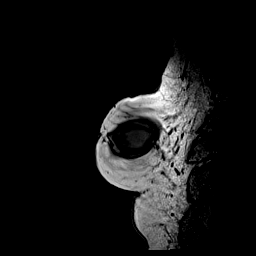
[im 39/128]
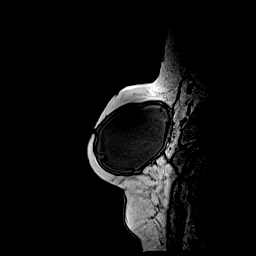
[im 51/128]
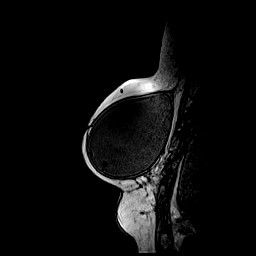
[im 77/128]
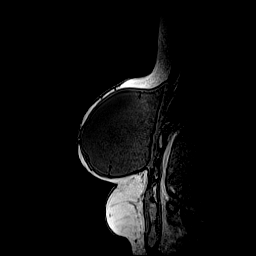
[im 89/128]
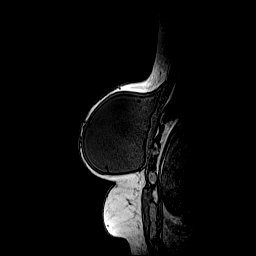
[im 102/128]
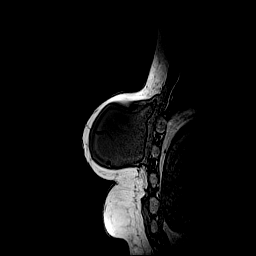
[im 128/128]
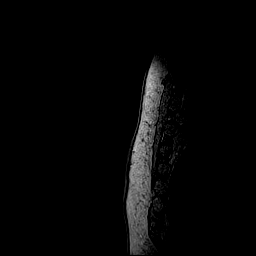

[Series 10: t2_tse_tra_spair rt · sagittal · left · 4.0mm · 0.69mm/px · 3 of 31 slices shown]
[im 1/31]
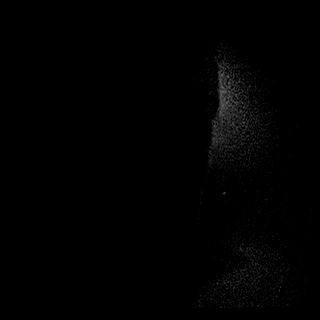
[im 16/31]
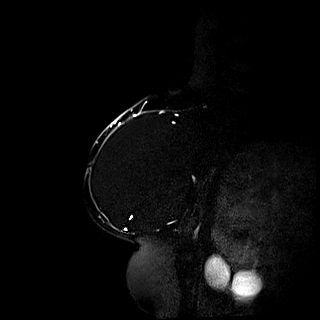
[im 31/31]
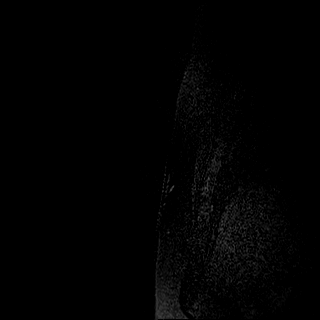

[Series 11: t2_tse_tra_spair lt · sagittal · left · 4.0mm · 0.69mm/px · 3 of 31 slices shown]
[im 1/31]
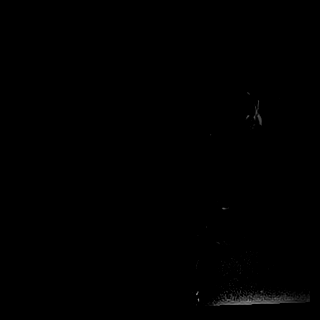
[im 16/31]
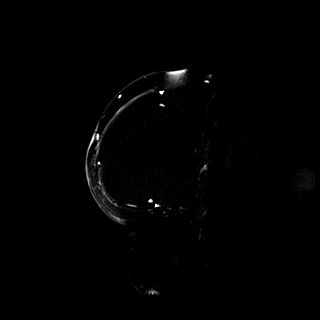
[im 31/31]
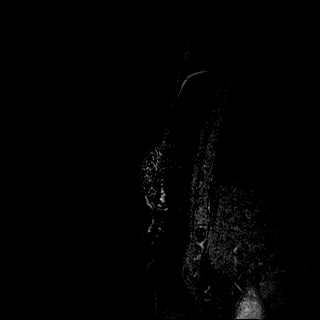

[44 of 48 positions shown; findings below may reference images not displayed]

Three-dimensional MR images were rendered by post-processing of the
original MR data on an independent workstation. The
three-dimensional MR images were interpreted, and findings are
reported in the following complete MRI report for this study. Three
dimensional images were evaluated at the independent [employer]
workstation
FINDINGS: Breast composition: a. Almost entirely fat.

Right breast: Mastectomy changes noted. Retroglandular implant noted
without evidence of rupture or complicating features.

Left breast: Mastectomy changes noted. Retroglandular implant noted
without evidence of rupture or complicating features.

Ancillary findings: No abnormal appearing lymph nodes are noted.
IMPRESSION: Bilateral implants without evidence of rupture or complicating
features.

Bilateral mastectomy changes.

RECOMMENDATION:
Consider clinical follow-up as indicated. Any further workup should
be based on clinical grounds.

BI-RADS CATEGORY  2: Benign.

## 2022-03-08 ENCOUNTER — Ambulatory Visit (INDEPENDENT_AMBULATORY_CARE_PROVIDER_SITE_OTHER): Payer: Medicare Other | Admitting: Plastic Surgery

## 2022-03-08 ENCOUNTER — Telehealth: Payer: Self-pay

## 2022-03-08 ENCOUNTER — Other Ambulatory Visit: Payer: Self-pay

## 2022-03-08 DIAGNOSIS — T8543XA Leakage of breast prosthesis and implant, initial encounter: Secondary | ICD-10-CM

## 2022-03-08 NOTE — Progress Notes (Signed)
? ?  Referring Provider ?Bucio, Lafayette Dragon, FNP ?[address] ?[place],  [place] [address]  ? ?CC:  ?Chief Complaint  ?Patient presents with  ? Follow-up  ?   ? ?Sharon Phillips is an 51 y.o. female.  ?HPI: Patient presents to review her MRI results.  She was bothered by some palpable folds on the left that seem to be folds in the breast implant itself.  She was also having some pain in the upper inner quadrant on the left side.  MRI was performed which showed normal gel implants with no evidence of rupture.  No additional abnormalities. ? ?Review of Systems ?General: Denies fevers and chills ? ?Physical Exam ? ?  03/02/2022  ? 10:17 AM 02/01/2022  ?  9:12 AM 01/26/2022  ? 10:24 AM  ?Vitals with BMI  ?Height '5\' 3"'$  '5\' 3"'$  5' 2.992"  ?Weight 174 lbs 4 oz 177 lbs 6 oz 180 lbs  ?BMI 30.87 31.43 31.89  ?Systolic 536  468  ?Diastolic 64  87  ?Pulse 95  98  ?  ?General:  No acute distress,  Alert and oriented, Non-Toxic, Normal speech and affect ?Exam: Unchanged.  No obvious evidence of capsular contracture.  She does have tenderness in the upper outer quadrant on the left side ? ?Assessment/Plan ?Patient presents in follow-up for MRI of the bilateral breast.  MRI showed no abnormalities.  I do not have a good explanation for the pain in the upper inner quadrant on the left side but do not suspect surgery would be of any benefit.  No other abnormalities visualized.  Recommend continued observation.  All questions were answered. ? ?Cindra Presume ?03/08/2022, 2:19 PM  ? ? ?    ?

## 2022-03-08 NOTE — Telephone Encounter (Signed)
Informed the patient that her A1C is normal. ?

## 2022-03-08 NOTE — Telephone Encounter (Signed)
-----   Message from Lavena Bullion, DO sent at 03/03/2022  8:54 AM EDT ----- ?Hemoglobin A1c is normal at 6%.  Repeat in 1 year for continued modified screening protocol. ?

## 2022-03-09 ENCOUNTER — Ambulatory Visit (HOSPITAL_COMMUNITY)
Admission: RE | Admit: 2022-03-09 | Discharge: 2022-03-09 | Disposition: A | Discharge status: Home / Self Care | Payer: Medicare Other | Source: Ambulatory Visit | Attending: Gastroenterology | Admitting: Gastroenterology

## 2022-03-09 ENCOUNTER — Other Ambulatory Visit: Payer: Self-pay | Admitting: Gastroenterology

## 2022-03-09 DIAGNOSIS — Z1501 Genetic susceptibility to malignant neoplasm of breast: Secondary | ICD-10-CM | POA: Insufficient documentation

## 2022-03-09 DIAGNOSIS — Z1509 Genetic susceptibility to other malignant neoplasm: Secondary | ICD-10-CM

## 2022-03-09 DIAGNOSIS — Z1589 Genetic susceptibility to other disease: Secondary | ICD-10-CM | POA: Diagnosis present

## 2022-03-09 DIAGNOSIS — Z9189 Other specified personal risk factors, not elsewhere classified: Secondary | ICD-10-CM

## 2022-03-09 IMAGING — MR MR ABDOMEN WO/W CM MRCP
20 of 22 series · 45 of 48 positions shown · IV contrast (gadavist)
Comparison: None.

CLINICAL DATA: Pancreatic cancer, monitor elevated risk of
Pancreatic Cancer; Pancreatic cancer, monitor monallelic mutation of
[4N] gene elevated risk of Pancreatic Cancer

EXAM:
MRI ABDOMEN WITHOUT AND WITH CONTRAST (INCLUDING MRCP)
TECHNIQUE: Multiplanar multisequence MR imaging of the abdomen was performed
both before and after the administration of intravenous contrast.
Heavily T2-weighted images of the biliary and pancreatic ducts were
obtained, and three-dimensional MRCP images were rendered by post
processing.
CONTRAST:  8mL GADAVIST GADOBUTROL 1 MMOL/ML IV SOLN

[Series 3: T2 fat-sat · axial · 6.0mm · 1.25mm/px · 1 of 40 slices shown]
[im 1/40]
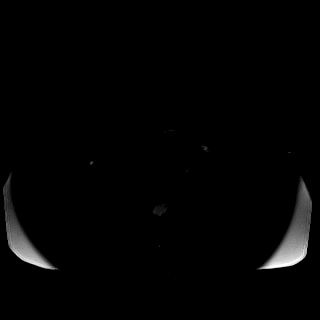

[Series 5: DWI · axial · 6.0mm · 1.49mm/px · z∈[-113,+175]mm · 2 of 82 slices shown (1 of 2)]
[im 1/82]
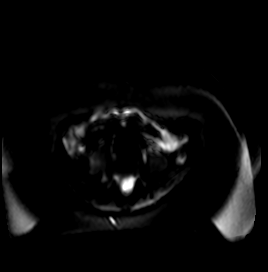
[im 82/82]
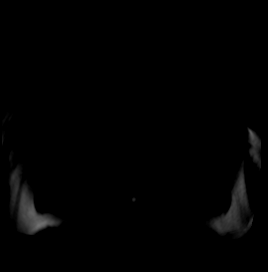

[Series 6: DWI · axial · 6.0mm · 1.49mm/px · 1 of 41 slices shown (2 of 2)]
[im 1/41]
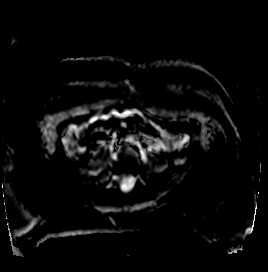

[Series 7: cor_3d_spc_trig-resp · 1 of 5 slices shown]
[im 1/5]
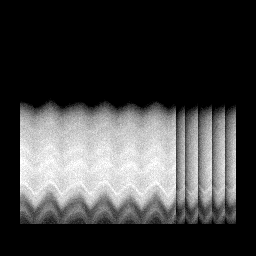

[Series 8: cor_3d_spc_trig · coronal · 1.0mm · 0.49mm/px · 2 of 72 slices shown]
[im 1/72]
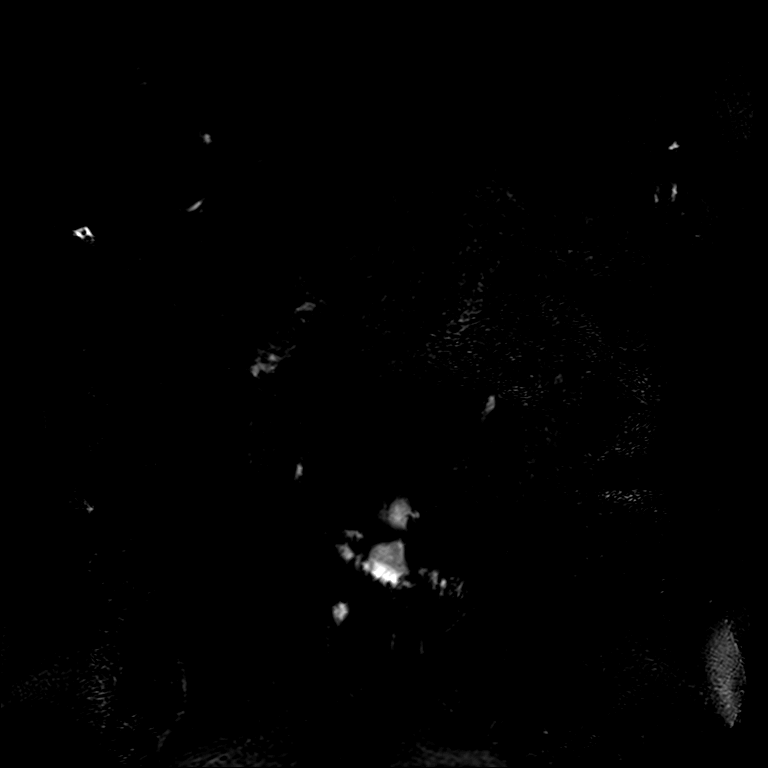
[im 72/72]
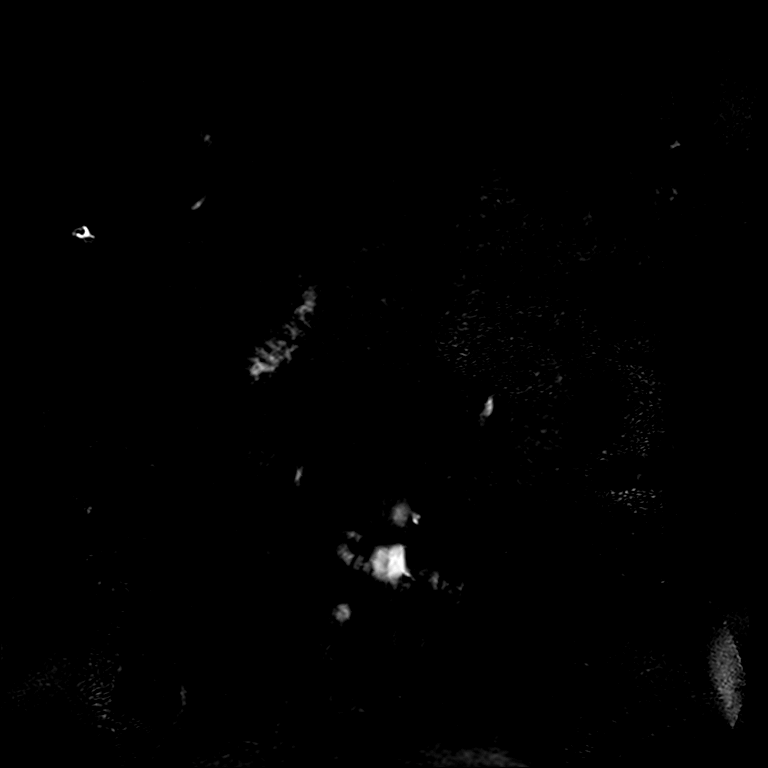

[Series 11: T2 · coronal · 6.0mm · 1.48mm/px · 1 of 30 slices shown (1 of 2)]
[im 1/30]
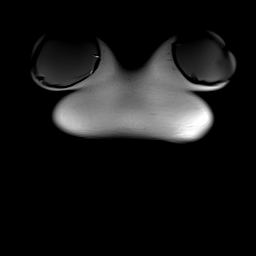

[Series 12: T1 · axial · 3.0mm · 1.25mm/px · z∈[-78,+159]mm · 3 of 80 slices shown (1 of 2)]
[im 1/80]
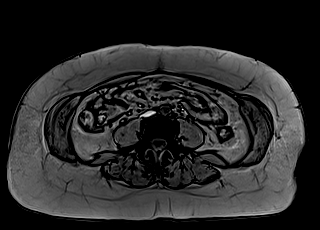
[im 40/80]
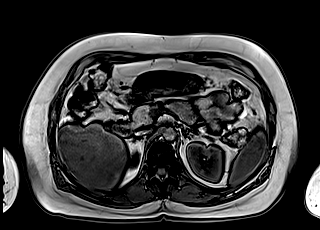
[im 80/80]
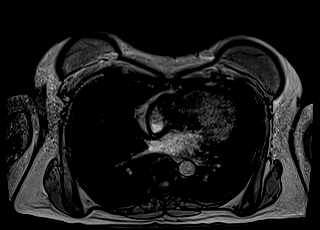

[Series 13: T1 · axial · 3.0mm · 1.25mm/px · z∈[-78,+159]mm · 3 of 80 slices shown (2 of 2)]
[im 1/80]
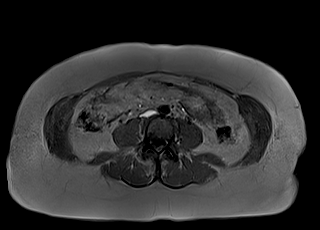
[im 40/80]
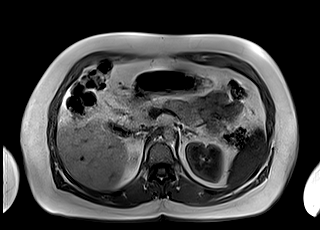
[im 80/80]
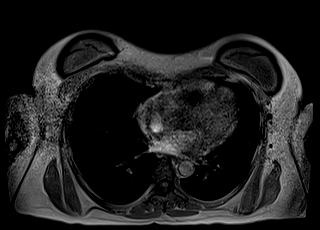

[Series 14: cor obl thk · oblique · 50.0mm · 0.78mm/px · 1 of 9 slices shown]
[im 1/9]
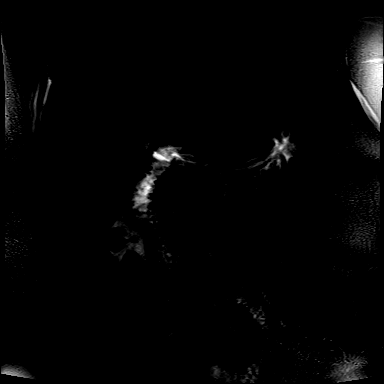

[Series 16: T2 · axial · 6.0mm · 1.56mm/px · 1 of 39 slices shown (2 of 2)]
[im 1/39]
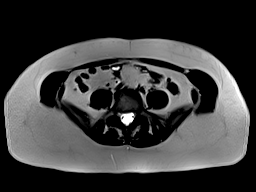

[Series 18: T1 dynamic · axial · 3.0mm · 1.25mm/px · z∈[-81,+156]mm · 3 of 80 slices shown (1 of 10)]
[im 1/80]
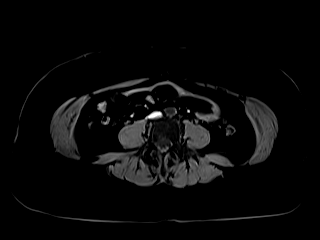
[im 40/80]
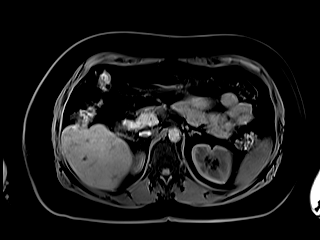
[im 80/80]
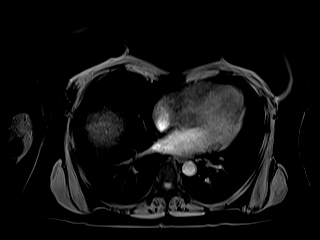

[Series 22: T1 dynamic · axial · 3.0mm · 1.25mm/px · z∈[-81,+156]mm · 3 of 80 slices shown (2 of 10)]
[im 1/80]
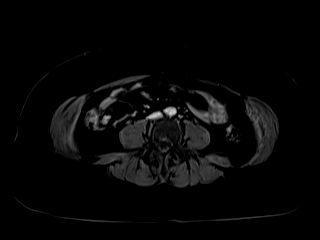
[im 40/80]
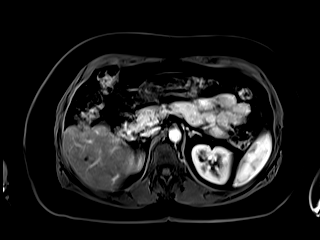
[im 80/80]
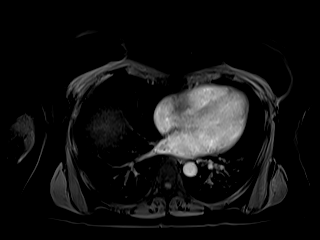

[Series 23: T1 dynamic · axial · 3.0mm · 1.25mm/px · z∈[-81,+156]mm · 3 of 80 slices shown (3 of 10)]
[im 1/80]
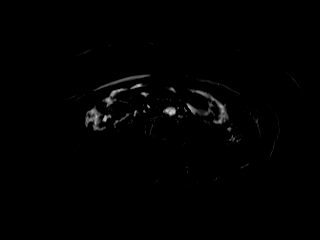
[im 40/80]
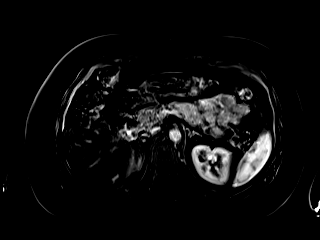
[im 80/80]
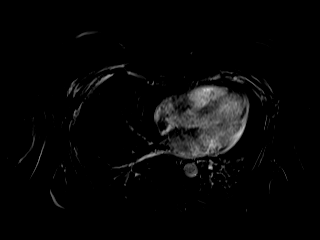

[Series 26: T1 dynamic · axial · 3.0mm · 1.25mm/px · z∈[-81,+156]mm · 3 of 80 slices shown (4 of 10)]
[im 1/80]
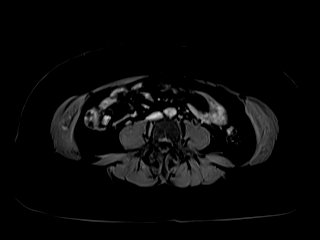
[im 40/80]
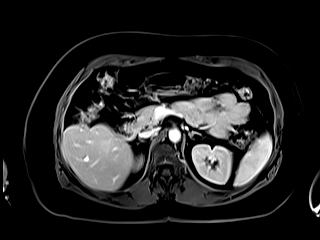
[im 80/80]
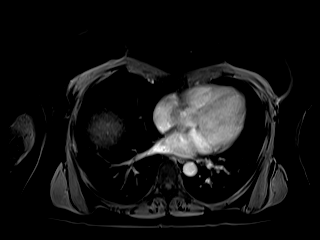

[Series 27: T1 dynamic · axial · 3.0mm · 1.25mm/px · z∈[-81,+156]mm · 3 of 80 slices shown (5 of 10)]
[im 1/80]
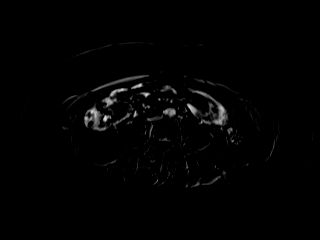
[im 40/80]
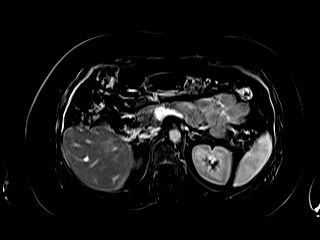
[im 80/80]
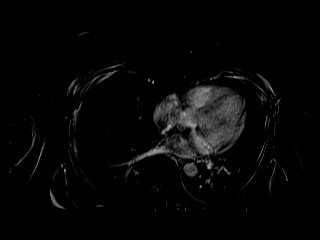

[Series 30: T1 dynamic · axial · 3.0mm · 1.25mm/px · z∈[-81,+156]mm · 3 of 80 slices shown (6 of 10)]
[im 1/80]
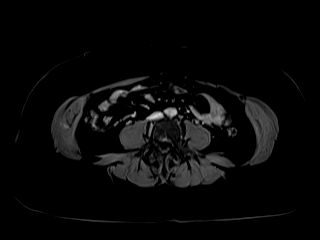
[im 40/80]
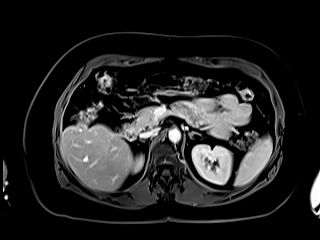
[im 80/80]
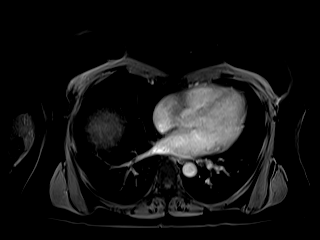

[Series 31: T1 dynamic · axial · 3.0mm · 1.25mm/px · z∈[-81,+156]mm · 3 of 80 slices shown (7 of 10)]
[im 1/80]
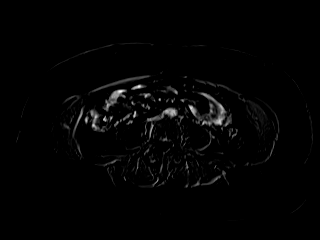
[im 40/80]
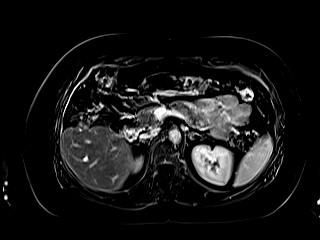
[im 80/80]
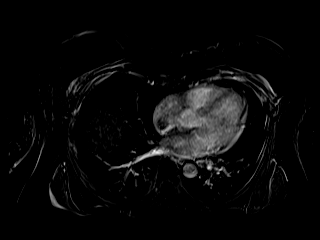

[Series 33: T1 dynamic · coronal · 5.0mm · 1.41mm/px · 2 of 48 slices shown (8 of 10)]
[im 1/48]
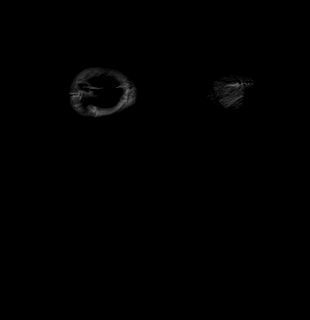
[im 48/48]
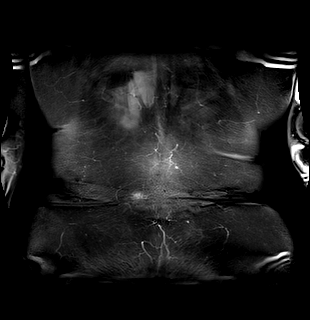

[Series 36: T1 dynamic · axial · 3.0mm · 1.25mm/px · z∈[-81,+156]mm · 3 of 80 slices shown (9 of 10)]
[im 1/80]
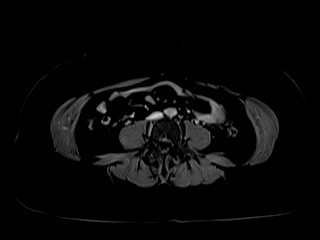
[im 40/80]
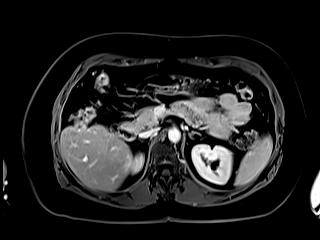
[im 80/80]
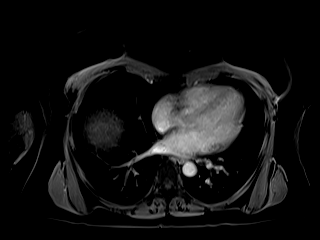

[Series 37: T1 dynamic · axial · 3.0mm · 1.25mm/px · z∈[-81,+156]mm · 3 of 80 slices shown (10 of 10)]
[im 1/80]
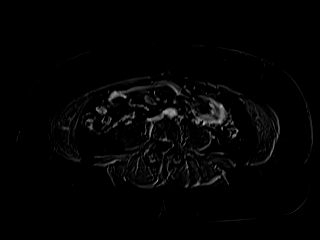
[im 40/80]
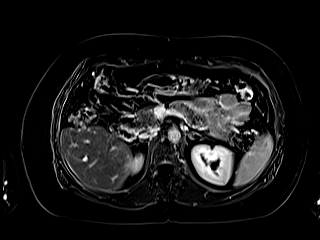
[im 80/80]
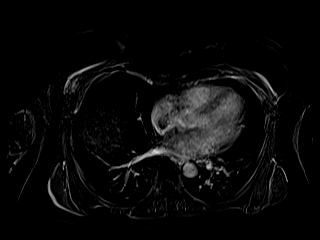

[45 of 48 positions shown; findings below may reference images not displayed]

FINDINGS: Lower chest: Bilateral breast prostheses.

Hepatobiliary: Moderate hepatic steatosis. No suspicious hepatic
lesion. Gallbladder is unremarkable. Biliary ductal dilation.

Pancreas: Intrinsic T1 signal of the pancreatic parenchyma is within
normal limits. There is homogeneous enhancement of the pancreatic
parenchyma postcontrast administration. No pancreatic ductal
dilation. No cystic or solid hyperenhancing pancreatic lesion
identified.

Spleen:  No splenomegaly or focal splenic lesion.

Adrenals/Urinary Tract: Bilateral adrenal glands appear normal. No
hydronephrosis. No solid enhancing renal mass.

Stomach/Bowel: Visualized portions within the abdomen are
unremarkable.

Vascular/Lymphatic: The portal, splenic and superior mesenteric
veins are patent. Normal caliber abdominal aorta. No pathologically
enlarged abdominal lymph nodes.

Other:  No abdominal free fluid.

Musculoskeletal: No suspicious bone lesions identified.
IMPRESSION: 1. No evidence of pancreatic mass or pancreatic ductal dilation.
2. Moderate hepatic steatosis.

## 2022-03-09 MED ORDER — GADOBUTROL 1 MMOL/ML IV SOLN
8.0000 mL | Freq: Once | INTRAVENOUS | Status: AC | PRN
  Administered 2022-03-09: 8 mL via INTRAVENOUS

## 2022-03-20 ENCOUNTER — Encounter: Payer: Self-pay | Admitting: Genetic Counselor

## 2022-04-04 ENCOUNTER — Encounter: Payer: Self-pay | Admitting: Internal Medicine

## 2022-05-15 ENCOUNTER — Other Ambulatory Visit (HOSPITAL_COMMUNITY): Payer: Self-pay | Admitting: *Deleted

## 2022-05-15 ENCOUNTER — Other Ambulatory Visit (HOSPITAL_COMMUNITY): Payer: Self-pay | Admitting: Hematology

## 2022-05-15 DIAGNOSIS — C50411 Malignant neoplasm of upper-outer quadrant of right female breast: Secondary | ICD-10-CM

## 2022-05-15 DIAGNOSIS — Z853 Personal history of malignant neoplasm of breast: Secondary | ICD-10-CM

## 2022-05-15 NOTE — Telephone Encounter (Signed)
Refill approved for Aromasin.  Per last ovn, patient tolerating and is to continue therapy.

## 2022-06-05 ENCOUNTER — Other Ambulatory Visit (HOSPITAL_COMMUNITY): Payer: Self-pay | Admitting: Hematology

## 2022-06-05 DIAGNOSIS — Z853 Personal history of malignant neoplasm of breast: Secondary | ICD-10-CM

## 2022-06-05 DIAGNOSIS — Z170 Estrogen receptor positive status [ER+]: Secondary | ICD-10-CM

## 2022-06-07 ENCOUNTER — Other Ambulatory Visit (HOSPITAL_COMMUNITY): Payer: Self-pay | Admitting: *Deleted

## 2022-06-07 NOTE — Telephone Encounter (Signed)
Aromasin refill approved.  Per last ovn, patient is tolerating and is to continue therapy.

## 2022-07-03 ENCOUNTER — Other Ambulatory Visit (HOSPITAL_COMMUNITY): Payer: Self-pay | Admitting: Hematology

## 2022-07-03 ENCOUNTER — Other Ambulatory Visit (HOSPITAL_COMMUNITY): Payer: Self-pay | Admitting: *Deleted

## 2022-07-03 DIAGNOSIS — Z853 Personal history of malignant neoplasm of breast: Secondary | ICD-10-CM

## 2022-07-03 DIAGNOSIS — C50412 Malignant neoplasm of upper-outer quadrant of left female breast: Secondary | ICD-10-CM

## 2022-07-03 NOTE — Telephone Encounter (Signed)
Aromasin refill approved.  Per last ovn, patient is tolerating and is to continue therapy.

## 2022-07-26 ENCOUNTER — Telehealth: Payer: Self-pay | Admitting: *Deleted

## 2022-07-26 NOTE — Telephone Encounter (Signed)
Late entry-----LMOM at the doctors office requesting records for ovarian ca    Called today and [facility] requesting records/pathology report for ovarian ca

## 2022-07-30 ENCOUNTER — Other Ambulatory Visit (HOSPITAL_COMMUNITY): Payer: Self-pay | Admitting: Hematology

## 2022-07-30 DIAGNOSIS — C50411 Malignant neoplasm of upper-outer quadrant of right female breast: Secondary | ICD-10-CM

## 2022-07-30 DIAGNOSIS — Z853 Personal history of malignant neoplasm of breast: Secondary | ICD-10-CM

## 2022-07-31 ENCOUNTER — Other Ambulatory Visit: Payer: Self-pay | Admitting: *Deleted

## 2022-07-31 NOTE — Telephone Encounter (Signed)
Called and left the referring office a requested pathology reports

## 2022-07-31 NOTE — Telephone Encounter (Signed)
Refill approved for Aromasin.  Patient tolerating and is to continue therapy.  

## 2022-08-14 NOTE — Telephone Encounter (Signed)
Spoke with the patient today; patient will drop off her records to the front desk

## 2022-08-22 NOTE — Telephone Encounter (Signed)
Called and left patient a message to see when the records would be dropped off.

## 2022-08-23 NOTE — Telephone Encounter (Signed)
Patient dropped off records and provider is reviewing them

## 2022-08-28 ENCOUNTER — Other Ambulatory Visit (HOSPITAL_COMMUNITY): Payer: Self-pay | Admitting: Hematology

## 2022-08-28 ENCOUNTER — Other Ambulatory Visit: Payer: Self-pay | Admitting: *Deleted

## 2022-08-28 ENCOUNTER — Inpatient Hospital Stay: Payer: Medicare Other | Attending: Hematology

## 2022-08-28 ENCOUNTER — Telehealth: Payer: Self-pay | Admitting: *Deleted

## 2022-08-28 DIAGNOSIS — E559 Vitamin D deficiency, unspecified: Secondary | ICD-10-CM

## 2022-08-28 DIAGNOSIS — Z853 Personal history of malignant neoplasm of breast: Secondary | ICD-10-CM | POA: Diagnosis present

## 2022-08-28 DIAGNOSIS — Z79811 Long term (current) use of aromatase inhibitors: Secondary | ICD-10-CM | POA: Insufficient documentation

## 2022-08-28 DIAGNOSIS — G629 Polyneuropathy, unspecified: Secondary | ICD-10-CM | POA: Insufficient documentation

## 2022-08-28 DIAGNOSIS — Z170 Estrogen receptor positive status [ER+]: Secondary | ICD-10-CM

## 2022-08-28 LAB — COMPREHENSIVE METABOLIC PANEL
ALT: 31 U/L (ref 0–44)
AST: 33 U/L (ref 15–41)
Albumin: 4.1 g/dL (ref 3.5–5.0)
Alkaline Phosphatase: 125 U/L (ref 38–126)
Anion gap: 5 (ref 5–15)
BUN: 13 mg/dL (ref 6–20)
CO2: 22 mmol/L (ref 22–32)
Calcium: 8.8 mg/dL — ABNORMAL LOW (ref 8.9–10.3)
Chloride: 111 mmol/L (ref 98–111)
Creatinine, Ser: 0.79 mg/dL (ref 0.44–1.00)
GFR, Estimated: >60 mL/min (ref >60)
Glucose, Bld: 118 mg/dL — ABNORMAL HIGH (ref 70–99)
Potassium: 3.9 mmol/L (ref 3.5–5.1)
Sodium: 138 mmol/L (ref 135–145)
Total Bilirubin: 1 mg/dL (ref 0.3–1.2)
Total Protein: 7.1 g/dL (ref 6.5–8.1)

## 2022-08-28 LAB — CBC WITH DIFFERENTIAL/PLATELET
Abs Immature Granulocytes: 0 10*3/uL (ref 0.00–0.07)
Basophils Absolute: 0.1 10*3/uL (ref 0.0–0.1)
Basophils Relative: 2 %
Eosinophils Absolute: 0.1 10*3/uL (ref 0.0–0.5)
Eosinophils Relative: 2 %
HCT: 42.3 % (ref 36.0–46.0)
Hemoglobin: 14.1 g/dL (ref 12.0–15.0)
Immature Granulocytes: 0 %
Lymphocytes Relative: 42 %
Lymphs Abs: 1.4 10*3/uL (ref 0.7–4.0)
MCH: 31.5 pg (ref 26.0–34.0)
MCHC: 33.3 g/dL (ref 30.0–36.0)
MCV: 94.6 fL (ref 80.0–100.0)
Monocytes Absolute: 0.2 10*3/uL (ref 0.1–1.0)
Monocytes Relative: 7 %
Neutro Abs: 1.6 10*3/uL — ABNORMAL LOW (ref 1.7–7.7)
Neutrophils Relative %: 47 %
Platelets: 150 10*3/uL (ref 150–400)
RBC: 4.47 MIL/uL (ref 3.87–5.11)
RDW: 13 % (ref 11.5–15.5)
WBC: 3.4 10*3/uL — ABNORMAL LOW (ref 4.0–10.5)
nRBC: 0 % (ref 0.0–0.2)

## 2022-08-28 LAB — VITAMIN D 25 HYDROXY (VIT D DEFICIENCY, FRACTURES): Vit D, 25-Hydroxy: 33.02 ng/mL (ref 30–100)

## 2022-08-28 NOTE — Telephone Encounter (Signed)
Refill approved for Aromasin.  Tolerating and is to continue therapy.

## 2022-08-28 NOTE — Telephone Encounter (Signed)
Pt returning a call from [place]. She states she will come to the office tomorrow to sign a release form to get her records from her Dr. In [place].   Hiltonia Wheat notified.

## 2022-08-28 NOTE — Telephone Encounter (Signed)
Spoke with the patient and requested more records regarding her ovarian cancer. The patient will call her doctor's office and have the records fax to our office.

## 2022-08-29 LAB — CANCER ANTIGEN 27.29: CA 27.29: 16.9 U/mL (ref 0.0–38.6)

## 2022-08-31 LAB — CANCER ANTIGEN 15-3: CA 15-3: 18.6 U/mL (ref 0.0–25.0)

## 2022-09-04 ENCOUNTER — Inpatient Hospital Stay: Payer: Medicare Other | Attending: Hematology | Admitting: Hematology

## 2022-09-04 VITALS — BP 151/92 | HR 87 | Temp 97.8°F | Resp 18 | Ht 64.0 in | Wt 168.3 lb

## 2022-09-04 DIAGNOSIS — Z79811 Long term (current) use of aromatase inhibitors: Secondary | ICD-10-CM | POA: Diagnosis not present

## 2022-09-04 DIAGNOSIS — Z170 Estrogen receptor positive status [ER+]: Secondary | ICD-10-CM

## 2022-09-04 DIAGNOSIS — G629 Polyneuropathy, unspecified: Secondary | ICD-10-CM | POA: Diagnosis present

## 2022-09-04 DIAGNOSIS — Z7952 Long term (current) use of systemic steroids: Secondary | ICD-10-CM

## 2022-09-04 DIAGNOSIS — R110 Nausea: Secondary | ICD-10-CM | POA: Insufficient documentation

## 2022-09-04 DIAGNOSIS — C50412 Malignant neoplasm of upper-outer quadrant of left female breast: Secondary | ICD-10-CM | POA: Diagnosis not present

## 2022-09-04 DIAGNOSIS — C50411 Malignant neoplasm of upper-outer quadrant of right female breast: Secondary | ICD-10-CM

## 2022-09-04 DIAGNOSIS — Z90710 Acquired absence of both cervix and uterus: Secondary | ICD-10-CM | POA: Diagnosis not present

## 2022-09-04 DIAGNOSIS — Z853 Personal history of malignant neoplasm of breast: Secondary | ICD-10-CM | POA: Diagnosis not present

## 2022-09-04 NOTE — Patient Instructions (Addendum)
[place] at [facility] Discharge Instructions   You were seen and examined today by Dr. Delton Coombes.  He reviewed the results of your lab work which are normal/stable.   Dr. Raliegh Ip wants you to increase your gabapentin to 1 pill 3 times a day.   We will see you back in 6 months. We will repeat your blood work prior to this visit.    Thank you for choosing [place] at [facility] to provide your oncology and hematology care.  To afford each patient quality time with our provider, please arrive at least 15 minutes before your scheduled appointment time.   If you have a lab appointment with the [place] please come in thru the Main Entrance and check in at the main information desk.  You need to re-schedule your appointment should you arrive 10 or more minutes late.  We strive to give you quality time with our providers, and arriving late affects you and other patients whose appointments are after yours.  Also, if you no show three or more times for appointments you may be dismissed from the clinic at the providers discretion.     Again, thank you for choosing [facility].  Our hope is that these requests will decrease the amount of time that you wait before being seen by our physicians.       _____________________________________________________________  Should you have questions after your visit to [facility], please contact our office at [phone] and follow the prompts.  Our office hours are 8:00 a.m. and 4:30 p.m. Monday - Friday.  Please note that voicemails left after 4:00 p.m. may not be returned until the following business day.  We are closed weekends and major holidays.  You do have access to a nurse 24-7, just call the main number to the clinic [phone] and do not press any options, hold on the line and a nurse will answer the phone.    For prescription refill requests, have your pharmacy contact our  office and allow 72 hours.    Due to Covid, you will need to wear a mask upon entering the hospital. If you do not have a mask, a mask will be given to you at the Main Entrance upon arrival. For doctor visits, patients may have 1 support person age 15 or older with them. For treatment visits, patients can not have anyone with them due to social distancing guidelines and our immunocompromised population.

## 2022-09-04 NOTE — Progress Notes (Signed)
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 Report date is 02/06/2022.  The CancerNext gene panel offered by [employer] includes sequencing, rearrangement analysis, and RNA analysis for the following 36 genes:   APC, ATM, AXIN2, BARD1, BMPR1A, BRCA1, BRCA2, BRIP1, CDH1, CDK4, CDKN2A, CHEK2, DICER1, HOXB13, EPCAM, GREM1, MLH1, MSH2, MSH3, MSH6, MUTYH, NBN, NF1, NTHL1, PALB2, PMS2, POLD1, POLE, PTEN, RAD51C, RAD51D, RECQL, SMAD4, SMARCA4, STK11, and TP53.      CHIEF COMPLIANT: Follow-up of history of bilateral breast cancer   INTERVAL HISTORY: [name] is a 51 y.o. female seen for follow-up of bilateral breast cancer.  She is tolerating exemestane reasonably well.  She reported numbness and burning sensation in the right arm and forearm on the radial side for the last 1 and half week.  She is taking gabapentin twice daily.  She has cut it down to once daily a month ago and has restarted taking twice daily.  REVIEW OF SYSTEMS:   Review of Systems  Constitutional:  Negative for appetite change.  Respiratory:  Positive for shortness of breath (With exertion).   Neurological:  Positive for numbness (Right arm and forearm).  All other systems reviewed and are negative.   I have reviewed the past medical history, past surgical history, social history and family history with the patient and they are unchanged from previous note.   ALLERGIES:   is allergic to chlorhexidine, hibiclens [chlorhexidine gluconate],  and povidone-iodine.   MEDICATIONS:  Current Outpatient Medications  Medication Sig Dispense Refill   acetaminophen (TYLENOL) 325 MG tablet Take 650 mg by mouth every 6 (six) hours as needed for mild pain, moderate pain, fever or headache.     AJOVY 225 MG/1.5ML SOAJ Inject into the skin every 30 (thirty) days.     betamethasone valerate (VALISONE) 0.1 % cream Apply topically 2 (two) times daily.     Butalbital-Acetaminophen 50-300 MG CAPS Take 1 capsule by mouth every 4 (four) hours as needed for headache.     cetirizine (ZYRTEC) 10 MG tablet Take 10 mg by mouth daily.     clonazePAM (KLONOPIN) 0.5 MG tablet Take 0.5 mg by mouth 2 (two) times daily as needed for anxiety.     exemestane (AROMASIN) 25 MG tablet Take 1 tablet by mouth once daily 30 tablet 0   fluticasone (FLONASE) 50 MCG/ACT nasal spray Place into both nostrils daily.     gabapentin (NEURONTIN) 300 MG capsule Take 300 mg by mouth 2 (two) times daily.     lisinopril (ZESTRIL) 10 MG tablet Take 10 mg by mouth 2 (two) times daily.     meclizine (ANTIVERT) 12.5 MG tablet Take 12.5 mg by mouth daily as needed for dizziness.     metoprolol succinate (TOPROL-XL) 25 MG 24 hr tablet 1 tablet     Multiple Vitamin (MULTIVITAMIN) capsule Take 1 capsule by mouth daily.     NURTEC 75 MG TBDP Take by mouth.     ondansetron (ZOFRAN-ODT) 8 MG disintegrating tablet Take 8 mg by mouth every 8 (eight) hours as needed for  nausea or vomiting.     prochlorperazine (COMPAZINE) 10 MG tablet Take 10 mg by mouth every 6 (six) hours as needed for nausea or vomiting.     QUEtiapine (SEROQUEL) 50 MG tablet Take 50 mg by mouth daily as needed (mood).     rosuvastatin (CRESTOR) 5 MG tablet Take 5 mg by mouth daily.     sertraline (ZOLOFT) 50 MG tablet Take 50 mg by mouth daily.     SUMAtriptan (IMITREX) 100 MG tablet Take 100 mg by mouth every 2 (two) hours as needed for migraine. May repeat in 2 hours if headache persists or recurs.     topiramate  (TOPAMAX) 25 MG tablet Take 25 mg by mouth 2 (two) times daily.     triamcinolone (KENALOG) 0.025 % cream Apply 1 application topically 2 (two) times daily.     venlafaxine XR (EFFEXOR-XR) 150 MG 24 hr capsule Take 150 mg by mouth daily.     vitamin E 180 MG (400 UNITS) capsule Take 400 Units by mouth daily.     No current facility-administered medications for this visit.     PHYSICAL EXAMINATION: Performance status (ECOG): 0 - Asymptomatic  Vitals:   09/04/22 1117  BP: (!) 151/92  Pulse: 87  Resp: 18  Temp: 97.8 F (36.6 C)  SpO2: 99%   Wt Readings from Last 3 Encounters:  09/04/22 168 lb 4.8 oz (76.3 kg)  03/02/22 174 lb 4 oz (79 kg)  02/01/22 177 lb 6.4 oz (80.5 kg)   Physical Exam Vitals reviewed.  Constitutional:      Appearance: Normal appearance. She is obese.  Cardiovascular:     Rate and Rhythm: Normal rate and regular rhythm.     Pulses: Normal pulses.     Heart sounds: Normal heart sounds.  Pulmonary:     Effort: Pulmonary effort is normal.     Breath sounds: Normal breath sounds.  Chest:  Breasts:    Right: No tenderness.     Left: Skin change (2 nodules from implant capsule palpable in lower breast) and tenderness (superior to implant) present.    Neurological:     General: No focal deficit present.     Mental Status: She is alert and oriented to person, place, and time.  Psychiatric:        Mood and Affect: Mood normal.        Behavior: Behavior normal.        LABORATORY DATA:  I have reviewed the data as listed    Latest Ref Rng & Units 08/28/2022    9:27 AM 01/19/2022    8:54 AM 08/01/2021    2:21 PM  CMP  Glucose 70 - 99 mg/dL 118  110  88   BUN 6 - 20 mg/dL 13  15  14    Creatinine 0.44 - 1.00 mg/dL 0.79  0.81  0.70   Sodium 135 - 145 mmol/L 138  138  139   Potassium 3.5 - 5.1 mmol/L 3.9  4.0  3.7   Chloride 98 - 111 mmol/L 111  105  108   CO2 22 - 32 mmol/L 22  24  26    Calcium 8.9 - 10.3 mg/dL 8.8  9.2  8.8   Total Protein 6.5 -  8.1 g/dL 7.1  7.0  7.6   Total Bilirubin 0.3 - 1.2 mg/dL 1.0  0.7  0.4   Alkaline Phos 38 - 126 U/L 125  107  117   AST 15 - 41 U/L 33  29  30   ALT 0 - 44 U/L 31  32  24    Lab Results  Component Value Date   CAN153 18.6 08/28/2022   CAN153 20.5 01/19/2022   CAN153 22.2 08/01/2021   Lab Results  Component Value Date   WBC 3.4 (L) 08/28/2022   HGB 14.1 08/28/2022   HCT 42.3 08/28/2022   MCV 94.6 08/28/2022   PLT 150 08/28/2022   NEUTROABS 1.6 (L) 08/28/2022    ASSESSMENT:  1.  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  Changes consistent with HPV effect.  Uterus, cervix, bilateral fallopian tubes and ovaries are negative for malignancy.  2.  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 - Non-smoker. - No family history of malignancies.   PLAN:  1.  T1CN1A right breast IDC, ER/PR positive, HER2 negative and T1CN1A left breast TNBC: - Physical exam today is within normal limits. - She was evaluated by plastic surgery for small bumps along the capsule of the breast implant.  MRI of the breast was negative for any rupture. - Reviewed labs today which showed normal LFTs and CBC.  Tumor markers CA 15-3 and CA 27-29 are within normal limits. -  Continue exemestane daily. - RTC 6 months for follow-up with repeat labs including tumor markers.  2.  Peripheral neuropathy: - She is taking gabapentin 300 mg twice daily. - She reports right arm and forearm burning sensation on the radial side, started about 1 and half week ago. - I will increase gabapentin to 300 mg 3 times daily.  She was told to call us if this symptom does not get better in the next 4 weeks.  3.  Bone health: - Continue calcium and vitamin D supplements. - Vitamin D level is normal.  4.  Nausea: - She has a chronic intermittent nausea since he was treated with chemotherapy. - Continue Zofran and Compazine as needed.  Breast Cancer therapy associated bone loss: I have recommended calcium, Vitamin D and weight bearing exercises.  Orders placed this encounter:  Orders Placed This Encounter  Procedures   CBC with Differential   Comprehensive metabolic panel   VITAMIN D 25 Hydroxy (Vit-D Deficiency, Fractures)   Cancer antigen 27.29   Cancer antigen 15-3     The patient has a good understanding of the overall plan. She agrees with it. She will call with any problems that may develop before the next visit here.  Derek Jack, MD [place] [phone]

## 2022-09-25 ENCOUNTER — Other Ambulatory Visit: Payer: Self-pay | Admitting: *Deleted

## 2022-09-25 ENCOUNTER — Other Ambulatory Visit (HOSPITAL_COMMUNITY): Payer: Self-pay | Admitting: Hematology

## 2022-09-25 DIAGNOSIS — C50411 Malignant neoplasm of upper-outer quadrant of right female breast: Secondary | ICD-10-CM

## 2022-09-25 DIAGNOSIS — Z853 Personal history of malignant neoplasm of breast: Secondary | ICD-10-CM

## 2022-09-25 NOTE — Telephone Encounter (Signed)
Aromasin refill approved.  Patient tolerating and is to continue therapy. 

## 2022-10-05 ENCOUNTER — Telehealth: Payer: Self-pay | Admitting: *Deleted

## 2022-10-05 NOTE — Telephone Encounter (Signed)
Fax medical release to [facility]  in [place]

## 2022-10-15 ENCOUNTER — Emergency Department (HOSPITAL_COMMUNITY)
Admission: EM | Admit: 2022-10-15 | Discharge: 2022-10-15 | Disposition: A | Discharge status: Home / Self Care | Payer: Medicare Other | Attending: Emergency Medicine | Admitting: Emergency Medicine

## 2022-10-15 ENCOUNTER — Other Ambulatory Visit: Payer: Self-pay

## 2022-10-15 ENCOUNTER — Encounter (HOSPITAL_COMMUNITY): Payer: Self-pay | Admitting: *Deleted

## 2022-10-15 ENCOUNTER — Emergency Department (HOSPITAL_COMMUNITY): Payer: Medicare Other

## 2022-10-15 DIAGNOSIS — G44201 Tension-type headache, unspecified, intractable: Secondary | ICD-10-CM | POA: Insufficient documentation

## 2022-10-15 DIAGNOSIS — I10 Essential (primary) hypertension: Secondary | ICD-10-CM | POA: Diagnosis not present

## 2022-10-15 DIAGNOSIS — Z853 Personal history of malignant neoplasm of breast: Secondary | ICD-10-CM | POA: Diagnosis not present

## 2022-10-15 DIAGNOSIS — Z79899 Other long term (current) drug therapy: Secondary | ICD-10-CM | POA: Diagnosis not present

## 2022-10-15 DIAGNOSIS — R519 Headache, unspecified: Secondary | ICD-10-CM | POA: Diagnosis present

## 2022-10-15 LAB — CBC WITH DIFFERENTIAL/PLATELET
Abs Immature Granulocytes: 0.01 10*3/uL (ref 0.00–0.07)
Basophils Absolute: 0.1 10*3/uL (ref 0.0–0.1)
Basophils Relative: 1 %
Eosinophils Absolute: 0.1 10*3/uL (ref 0.0–0.5)
Eosinophils Relative: 2 %
HCT: 43.6 % (ref 36.0–46.0)
Hemoglobin: 14.4 g/dL (ref 12.0–15.0)
Immature Granulocytes: 0 %
Lymphocytes Relative: 34 %
Lymphs Abs: 2 10*3/uL (ref 0.7–4.0)
MCH: 31.9 pg (ref 26.0–34.0)
MCHC: 33 g/dL (ref 30.0–36.0)
MCV: 96.7 fL (ref 80.0–100.0)
Monocytes Absolute: 0.3 10*3/uL (ref 0.1–1.0)
Monocytes Relative: 5 %
Neutro Abs: 3.4 10*3/uL (ref 1.7–7.7)
Neutrophils Relative %: 58 %
Platelets: 171 10*3/uL (ref 150–400)
RBC: 4.51 MIL/uL (ref 3.87–5.11)
RDW: 12.5 % (ref 11.5–15.5)
WBC: 5.8 10*3/uL (ref 4.0–10.5)
nRBC: 0 % (ref 0.0–0.2)

## 2022-10-15 LAB — CBG MONITORING, ED: Glucose-Capillary: 123 mg/dL — ABNORMAL HIGH (ref 70–99)

## 2022-10-15 LAB — BASIC METABOLIC PANEL
Anion gap: 7 (ref 5–15)
BUN: 15 mg/dL (ref 6–20)
CO2: 28 mmol/L (ref 22–32)
Calcium: 9 mg/dL (ref 8.9–10.3)
Chloride: 104 mmol/L (ref 98–111)
Creatinine, Ser: 0.63 mg/dL (ref 0.44–1.00)
GFR, Estimated: >60 mL/min (ref >60)
Glucose, Bld: 128 mg/dL — ABNORMAL HIGH (ref 70–99)
Potassium: 3.6 mmol/L (ref 3.5–5.1)
Sodium: 139 mmol/L (ref 135–145)

## 2022-10-15 LAB — MAGNESIUM: Magnesium: 2.2 mg/dL (ref 1.7–2.4)

## 2022-10-15 MED ORDER — PROCHLORPERAZINE EDISYLATE 10 MG/2ML IJ SOLN
10.0000 mg | Freq: Once | INTRAMUSCULAR | Status: AC
  Administered 2022-10-15: 10 mg via INTRAVENOUS
  Filled 2022-10-15: qty 2

## 2022-10-15 MED ORDER — ACETAMINOPHEN 500 MG PO TABS
1000.0000 mg | ORAL_TABLET | Freq: Once | ORAL | Status: AC
  Administered 2022-10-15: 1000 mg via ORAL
  Filled 2022-10-15: qty 2

## 2022-10-15 MED ORDER — KETOROLAC TROMETHAMINE 15 MG/ML IJ SOLN
15.0000 mg | Freq: Once | INTRAMUSCULAR | Status: AC
  Administered 2022-10-15: 15 mg via INTRAVENOUS
  Filled 2022-10-15: qty 1

## 2022-10-15 MED ORDER — DIPHENHYDRAMINE HCL 50 MG/ML IJ SOLN
12.5000 mg | Freq: Once | INTRAMUSCULAR | Status: AC
  Administered 2022-10-15: 12.5 mg via INTRAVENOUS
  Filled 2022-10-15: qty 1

## 2022-10-15 NOTE — ED Triage Notes (Signed)
Pt with right sided HA and dizziness, states her chest doesn't feel right.  States her BP elevated at home 178.  Pt states her right side does not feel normal.  Last normal was yesterday at 2030.

## 2022-10-15 NOTE — ED Provider Notes (Addendum)
[facility] EMERGENCY DEPARTMENT Provider Note   CSN: [id number] Arrival date & time: 10/15/22  1407     History  Chief Complaint  Patient presents with   Hypertension    Sharon Phillips is a 51 y.o. female with history of breast cancer s/p BL mastectomy, HTN, high cholesterol, anxiety/depression, NAFLD, migraines presents with hypertension, headache.   Patient presents today with concerns for hypertension.  She woke up this morning feeling dizzy/lightheaded, the room was not spinning around her but she "did not feel right."  She went to take a shower and she had to sit down she noticed she felt sort of nauseated and began to have a right-sided headache.  This prompted her to take her blood pressure which was in the 967E systolic.  She continued to check her blood pressure several more times and it continued to get higher and higher.  Eventually her blood pressure reached 938 systolic and she came to the emergency department.  During this time her migraine began to get worse, this felt different than her prior migraines which are normally located in the front.  This time it was located in the back associate with photophobia and nausea.  She also has a tingling sensation in her right upper arm that she reports she has had before.  Denies any fever/chills, recent illnesses, visual changes, vomiting, chest pain, palpitations, shortness of breath, abdominal pain, urinary symptoms, lower extremity edema, falls, head trauma, asymmetric weakness, trouble speaking.  Patient took her blood pressure this morning including metoprolol and lisinopril.   Hypertension       Home Medications Prior to Admission medications   Medication Sig Start Date End Date Taking? Authorizing Provider  acetaminophen (TYLENOL) 325 MG tablet Take 650 mg by mouth every 6 (six) hours as needed for mild pain, moderate pain, fever or headache.    [provider]  AJOVY 225 MG/1.5ML SOAJ Inject into the skin every  30 (thirty) days. 06/08/21   [provider]  betamethasone valerate (VALISONE) 0.1 % cream Apply topically 2 (two) times daily.    [provider]  Butalbital-Acetaminophen 50-300 MG CAPS Take 1 capsule by mouth every 4 (four) hours as needed for headache. 02/11/20   [provider]  cetirizine (ZYRTEC) 10 MG tablet Take 10 mg by mouth daily. 04/13/21   [provider]  clonazePAM (KLONOPIN) 0.5 MG tablet Take 0.5 mg by mouth 2 (two) times daily as needed for anxiety.    [provider]  exemestane (AROMASIN) 25 MG tablet Take 1 tablet by mouth once daily 09/25/22   Derek Jack, MD  fluticasone [facility]) 50 MCG/ACT nasal spray Place into both nostrils daily.    [provider]  gabapentin (NEURONTIN) 300 MG capsule Take 300 mg by mouth 2 (two) times daily. 04/13/21   [provider]  lisinopril (ZESTRIL) 10 MG tablet Take 10 mg by mouth 2 (two) times daily. 06/07/21   [provider]  meclizine (ANTIVERT) 12.5 MG tablet Take 12.5 mg by mouth daily as needed for dizziness.    [provider]  metoprolol succinate (TOPROL-XL) 25 MG 24 hr tablet 1 tablet 04/13/21   [provider]  Multiple Vitamin (MULTIVITAMIN) capsule Take 1 capsule by mouth daily.    [provider]  NURTEC 75 MG TBDP Take by mouth. 12/20/21   [provider]  ondansetron (ZOFRAN-ODT) 8 MG disintegrating tablet Take 8 mg by mouth every 8 (eight) hours as needed for nausea or vomiting. 05/04/21  [provider]  prochlorperazine (COMPAZINE) 10 MG tablet Take 10 mg by mouth every 6 (six) hours as needed for nausea or vomiting.    [provider]  QUEtiapine (SEROQUEL) 50 MG tablet Take 50 mg by mouth daily as needed (mood). 05/30/21   [provider]  rosuvastatin (CRESTOR) 5 MG tablet Take 5 mg by mouth daily. 01/09/22   [provider]  sertraline (ZOLOFT) 50 MG tablet Take 50 mg by mouth  daily.    [provider]  SUMAtriptan (IMITREX) 100 MG tablet Take 100 mg by mouth every 2 (two) hours as needed for migraine. May repeat in 2 hours if headache persists or recurs.    [provider]  topiramate (TOPAMAX) 25 MG tablet Take 25 mg by mouth 2 (two) times daily. 05/03/21   [provider]  triamcinolone (KENALOG) 0.025 % cream Apply 1 application topically 2 (two) times daily. 04/13/21   [provider]  venlafaxine XR (EFFEXOR-XR) 150 MG 24 hr capsule Take 150 mg by mouth daily. 01/09/22   [provider]  vitamin E 180 MG (400 UNITS) capsule Take 400 Units by mouth daily.    [provider]      Allergies    Chlorhexidine, Hibiclens [chlorhexidine gluconate], and Povidone-iodine    Review of Systems   Review of Systems Review of systems negative for f/c.  A 10 point review of systems was performed and is negative unless otherwise reported in HPI.  Physical Exam Updated Vital Signs BP (!) 184/113 (BP Location: Right Leg)   Pulse 88   Temp 97.6 F (36.4 C) (Oral)   Resp 16   Ht '5\' 4"'$  (1.626 m)   Wt 77.1 kg   SpO2 100%   BMI 29.16 kg/m  Physical Exam General: Normal appearing female, lying in bed.  HEENT: PERRLA, EOMI, no nystagmus, Sclera anicteric, MMM, trachea midline. Cardiology: RRR, no murmurs/rubs/gallops. BL radial and DP pulses equal bilaterally.  Resp: Tachypneic rate, normal respiratory effort. CTAB, no wheezes, rhonchi, crackles.  Abd: Soft, non-tender, non-distended. No rebound tenderness or guarding.  GU: Deferred. MSK: No peripheral edema or signs of trauma. Extremities without deformity or TTP. No cyanosis or clubbing. Skin: warm, dry. No rashes or lesions. Back: No CVA tenderness Neuro: A&Ox4, CNs II-XII grossly intact. MAEs. Sensation grossly intact. Intact coordination. Normal gait.  NIHSS 0. Psych: Anxious mood/affect   ED Results / Procedures / Treatments   Labs (all labs ordered are listed,  but only abnormal results are displayed) Labs Reviewed  CBG MONITORING, ED - Abnormal; Notable for the following components:      Result Value   Glucose-Capillary 123 (*)    All other components within normal limits  CBC WITH DIFFERENTIAL/PLATELET  BASIC METABOLIC PANEL    EKG EKG Interpretation  Date/Time:  Sunday October 15 2022 14:36:50 EST Ventricular Rate:  85 PR Interval:  141 QRS Duration: 93 QT Interval:  372 QTC Calculation: 443 R Axis:   21 Text Interpretation: Sinus rhythm Probable left atrial enlargement Confirmed by Cindee Lame [phone] on 10/15/2022 5:42:20 PM  Radiology CT Head Wo Contrast  Result Date: 10/15/2022 CLINICAL DATA:  Headache.  Dizziness.  Elevated blood pressure. EXAM: CT HEAD WITHOUT CONTRAST TECHNIQUE: Contiguous axial images were obtained from the base of the skull through the vertex without intravenous contrast. RADIATION DOSE REDUCTION: This exam was performed according to the departmental dose-optimization program which includes automated exposure control, adjustment of the mA and/or kV according to patient size  and/or use of iterative reconstruction technique. COMPARISON:  06/21/2021 FINDINGS: Brain: Lateral ventricular asymmetry is similar to on the prior exam, favored to be within normal variation. No mass lesion, hemorrhage, hydrocephalus, acute infarct, intra-axial, or extra-axial fluid collection. Vascular: No hyperdense vessel or unexpected calcification. Skull: Normal Sinuses/Orbits: Normal imaged portions of the orbits and globes. Clear paranasal sinuses and mastoid air cells. Other: None. IMPRESSION: No acute intracranial abnormality. Electronically Signed   By: Abigail Miyamoto M.D.   On: 10/15/2022 15:13    Procedures Procedures    Medications Ordered in ED Medications  prochlorperazine (COMPAZINE) injection 10 mg (10 mg Intravenous Given 10/15/22 1543)  diphenhydrAMINE (BENADRYL) injection 12.5 mg (12.5 mg Intravenous Given 10/15/22  1543)  ketorolac (TORADOL) 15 MG/ML injection 15 mg (15 mg Intravenous Given 10/15/22 1543)  acetaminophen (TYLENOL) tablet 1,000 mg (1,000 mg Oral Given 10/15/22 1539)    ED Course/ Medical Decision Making/ A&P                          Medical Decision Making Amount and/or Complexity of Data Reviewed Labs: ordered. Decision-making details documented in ED Course. Radiology: ordered. Decision-making details documented in ED Course.  Risk OTC drugs. Prescription drug management.   Patient is overall well appearing at this time. Systolic blood pressure is currently in the 150s mmHg. she is breathing in the 30s breaths per minute but states he is not short of breath.   Patient presents with headache, dizziness, nausea in the setting of taking her blood pressure multiple times at home.  Patient does have a history of migraines but states this is migraine is different than normal, however it is associate with photophobia/dizziness which is normal for her.  Patient did not have any chest pain, shortness of breath, palpitations to indicate a cardiac cause of dizziness and her EKG is without any arrhythmia or signs of ischemia.  She is at this time focally neuro intact with an NIHSS of 0 and states that the tingling sensation she has in her right upper arm she has had before in the past and it is not a new sensation. Patient does not have any symptoms on history nor signs on physical exam concerning for end organ damage secondary to hypertension.   Specifically, based up the patient's presentation, the patient is at sufficiently low risk for: -ACS given no CP, no SOB, normal cardio-pulmonary exam -SAH/stroke given no hx of no stroke like sxs and normal neurologic exam.  She does have a headache but states it came on gradually.  Given that this headache is different from her normal migraine headaches will obtain a CT head to evaluate.  Consider tension headache, migraine, atypical migraine.  -end  organ renal disease given no hematuria, will evaluate with BMP  Discussed with patient and her daughter about taking her blood pressure at home.  Encourage patient that taking the pressure multiple times and having it go up each time is consistent with anxiety about the blood pressure itself.  Patient is encouraged to take her blood pressure once per day in the afternoon after sitting for 10 to 15 minutes.  Patient is given headache cocktail for migraine.   I have personally reviewed and interpreted all labs and imaging.   Clinical Course as of 10/15/22 1643  Sun Oct 15, 2022  1530 CT Head Wo Contrast No acute intracranial abnormality.  [HN]  1530 Glucose-Capillary(!): 123 [HN]  1530 BP 159/80 [HN]    Clinical  Course User Index [HN] Audley Hose, MD   CT head without abnormality. Patient is signed out to the oncoming ED physician who is made aware of her history, presentation, exam, workup, and plan. Plan is to reevaluate symptoms and HTN after meds/headache cocktail. Final Clinical Impression(s) / ED Diagnoses Final diagnoses:  Acute intractable tension-type headache    Rx / DC Orders ED Discharge Orders     None        This note was created using dictation software, which may contain spelling or grammatical errors.      Audley Hose, MD 10/15/22 [phone]

## 2022-10-15 NOTE — ED Notes (Signed)
Patient has had lymph nodes removed bilaterally from breast due to breast cancer in 2019. Patient no longer has port due to infection; port was removed a year ago. Patient reports left arm being used to have blood drawn in past. Clarified with ED provider prior to drawing blood and IV placement to use left arm for both.

## 2022-10-15 NOTE — Discharge Instructions (Signed)
Follow-up with your family doctor this week for recheck 

## 2022-10-20 ENCOUNTER — Other Ambulatory Visit (HOSPITAL_COMMUNITY): Payer: Self-pay | Admitting: Hematology

## 2022-10-20 DIAGNOSIS — Z853 Personal history of malignant neoplasm of breast: Secondary | ICD-10-CM

## 2022-10-20 DIAGNOSIS — C50411 Malignant neoplasm of upper-outer quadrant of right female breast: Secondary | ICD-10-CM

## 2022-10-23 ENCOUNTER — Other Ambulatory Visit: Payer: Self-pay | Admitting: *Deleted

## 2022-10-23 NOTE — Telephone Encounter (Signed)
Refill for Aromasin approved.  Patient tolerating and is to continue therapy.  

## 2022-10-24 ENCOUNTER — Encounter: Payer: Self-pay | Admitting: Neurology

## 2022-10-31 ENCOUNTER — Telehealth: Payer: Self-pay

## 2022-10-31 NOTE — Telephone Encounter (Signed)
Spoke to patient to let her know I spoke to [place] from [place], to see if we received her medical records, I let the patient know I spoke with our HIM department and we do not have her records yet, I left a message with Ms. Jeani Hawking to let her know she can send them through the mail again or she can fax them and left the number for the fax, patient was thankful for the update and said she would call that office again.

## 2022-11-01 ENCOUNTER — Telehealth: Payer: Self-pay | Admitting: *Deleted

## 2022-11-01 NOTE — Telephone Encounter (Signed)
Explained to the patient that our doctors have reviewed the records her dropped off and the ones received from  [facility]. 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 Patient to follow up with a regular GYN office. Patient at the store and requested the office call her back tomorrow morning for the phone number for a regular GYN clinic.

## 2022-11-02 ENCOUNTER — Telehealth: Payer: Self-pay | Admitting: *Deleted

## 2022-11-02 NOTE — Telephone Encounter (Signed)
Per patient requested mailed records from [facility] to her home

## 2022-11-02 NOTE — Telephone Encounter (Signed)
Spoke with the patient and gave the number for [facility]

## 2022-11-09 ENCOUNTER — Other Ambulatory Visit (HOSPITAL_COMMUNITY): Payer: Self-pay | Admitting: Hematology

## 2022-11-09 DIAGNOSIS — Z170 Estrogen receptor positive status [ER+]: Secondary | ICD-10-CM

## 2022-11-09 DIAGNOSIS — Z853 Personal history of malignant neoplasm of breast: Secondary | ICD-10-CM

## 2022-12-08 ENCOUNTER — Encounter: Payer: Self-pay | Admitting: Gastroenterology

## 2023-03-06 ENCOUNTER — Other Ambulatory Visit: Payer: Medicare Other

## 2023-03-06 ENCOUNTER — Inpatient Hospital Stay: Payer: Medicare Other | Attending: Hematology

## 2023-03-06 DIAGNOSIS — C50912 Malignant neoplasm of unspecified site of left female breast: Secondary | ICD-10-CM | POA: Insufficient documentation

## 2023-03-06 DIAGNOSIS — R197 Diarrhea, unspecified: Secondary | ICD-10-CM | POA: Diagnosis not present

## 2023-03-06 DIAGNOSIS — Z170 Estrogen receptor positive status [ER+]: Secondary | ICD-10-CM | POA: Diagnosis not present

## 2023-03-06 DIAGNOSIS — C50412 Malignant neoplasm of upper-outer quadrant of left female breast: Secondary | ICD-10-CM

## 2023-03-06 DIAGNOSIS — Z7952 Long term (current) use of systemic steroids: Secondary | ICD-10-CM

## 2023-03-06 DIAGNOSIS — G629 Polyneuropathy, unspecified: Secondary | ICD-10-CM | POA: Diagnosis not present

## 2023-03-06 DIAGNOSIS — C50911 Malignant neoplasm of unspecified site of right female breast: Secondary | ICD-10-CM | POA: Insufficient documentation

## 2023-03-06 DIAGNOSIS — Z79811 Long term (current) use of aromatase inhibitors: Secondary | ICD-10-CM | POA: Insufficient documentation

## 2023-03-06 DIAGNOSIS — R110 Nausea: Secondary | ICD-10-CM | POA: Diagnosis not present

## 2023-03-06 DIAGNOSIS — Z9013 Acquired absence of bilateral breasts and nipples: Secondary | ICD-10-CM | POA: Insufficient documentation

## 2023-03-06 DIAGNOSIS — I10 Essential (primary) hypertension: Secondary | ICD-10-CM | POA: Diagnosis not present

## 2023-03-06 LAB — CBC WITH DIFFERENTIAL/PLATELET
Abs Immature Granulocytes: 0.01 10*3/uL (ref 0.00–0.07)
Basophils Absolute: 0.1 10*3/uL (ref 0.0–0.1)
Basophils Relative: 1 %
Eosinophils Absolute: 0.2 10*3/uL (ref 0.0–0.5)
Eosinophils Relative: 4 %
HCT: 44.5 % (ref 36.0–46.0)
Hemoglobin: 14.8 g/dL (ref 12.0–15.0)
Immature Granulocytes: 0 %
Lymphocytes Relative: 40 %
Lymphs Abs: 1.7 10*3/uL (ref 0.7–4.0)
MCH: 32.1 pg (ref 26.0–34.0)
MCHC: 33.3 g/dL (ref 30.0–36.0)
MCV: 96.5 fL (ref 80.0–100.0)
Monocytes Absolute: 0.4 10*3/uL (ref 0.1–1.0)
Monocytes Relative: 8 %
Neutro Abs: 2 10*3/uL (ref 1.7–7.7)
Neutrophils Relative %: 47 %
Platelets: 178 10*3/uL (ref 150–400)
RBC: 4.61 MIL/uL (ref 3.87–5.11)
RDW: 12.4 % (ref 11.5–15.5)
WBC: 4.3 10*3/uL (ref 4.0–10.5)
nRBC: 0 % (ref 0.0–0.2)

## 2023-03-06 LAB — COMPREHENSIVE METABOLIC PANEL
ALT: 31 U/L (ref 0–44)
AST: 30 U/L (ref 15–41)
Albumin: 4 g/dL (ref 3.5–5.0)
Alkaline Phosphatase: 143 U/L — ABNORMAL HIGH (ref 38–126)
Anion gap: 8 (ref 5–15)
BUN: 11 mg/dL (ref 6–20)
CO2: 26 mmol/L (ref 22–32)
Calcium: 9 mg/dL (ref 8.9–10.3)
Chloride: 106 mmol/L (ref 98–111)
Creatinine, Ser: 0.78 mg/dL (ref 0.44–1.00)
GFR, Estimated: >60 mL/min (ref >60)
Glucose, Bld: 87 mg/dL (ref 70–99)
Potassium: 3.7 mmol/L (ref 3.5–5.1)
Sodium: 140 mmol/L (ref 135–145)
Total Bilirubin: 0.5 mg/dL (ref 0.3–1.2)
Total Protein: 7.5 g/dL (ref 6.5–8.1)

## 2023-03-06 LAB — VITAMIN D 25 HYDROXY (VIT D DEFICIENCY, FRACTURES): Vit D, 25-Hydroxy: 36.19 ng/mL (ref 30–100)

## 2023-03-07 LAB — CANCER ANTIGEN 27.29: CA 27.29: 20.4 U/mL (ref 0.0–38.6)

## 2023-03-08 LAB — CANCER ANTIGEN 15-3: CA 15-3: 21.5 U/mL (ref 0.0–25.0)

## 2023-03-13 ENCOUNTER — Ambulatory Visit: Payer: Medicare Other | Admitting: Hematology

## 2023-03-13 ENCOUNTER — Ambulatory Visit: Payer: Medicare Other | Admitting: Neurology

## 2023-03-15 ENCOUNTER — Inpatient Hospital Stay (HOSPITAL_BASED_OUTPATIENT_CLINIC_OR_DEPARTMENT_OTHER): Payer: Medicare Other | Admitting: Physician Assistant

## 2023-03-15 VITALS — BP 182/125 | HR 101 | Temp 98.2°F | Resp 18 | Wt 175.0 lb

## 2023-03-15 DIAGNOSIS — Z09 Encounter for follow-up examination after completed treatment for conditions other than malignant neoplasm: Secondary | ICD-10-CM | POA: Diagnosis not present

## 2023-03-15 DIAGNOSIS — E559 Vitamin D deficiency, unspecified: Secondary | ICD-10-CM

## 2023-03-15 DIAGNOSIS — C50411 Malignant neoplasm of upper-outer quadrant of right female breast: Secondary | ICD-10-CM | POA: Diagnosis not present

## 2023-03-15 DIAGNOSIS — C50412 Malignant neoplasm of upper-outer quadrant of left female breast: Secondary | ICD-10-CM

## 2023-03-15 DIAGNOSIS — Z170 Estrogen receptor positive status [ER+]: Secondary | ICD-10-CM

## 2023-03-15 DIAGNOSIS — C50911 Malignant neoplasm of unspecified site of right female breast: Secondary | ICD-10-CM | POA: Diagnosis not present

## 2023-03-15 NOTE — Progress Notes (Signed)
[facility] [address], [place] [address]   Patient Care Team: Bucio, Julian Reil, FNP as PCP - General (Family Medicine) Doreatha Massed, MD as Medical Oncologist (Oncology) Therese Sarah, RN as Oncology Nurse Navigator (Oncology)  SUMMARY OF ONCOLOGIC HISTORY: Oncology History  Bilateral breast cancer  08/01/2021 Initial Diagnosis   Bilateral breast cancer [name])    Genetic Testing   [facility] CancerNext Panel identified one pathogenic 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 Report date is 02/06/2022.  The CancerNext gene panel offered by [employer] includes sequencing, rearrangement analysis, and RNA analysis for the following 36 genes:   APC, ATM, AXIN2, BARD1, BMPR1A, BRCA1, BRCA2, BRIP1, CDH1, CDK4, CDKN2A, CHEK2, DICER1, HOXB13, EPCAM, GREM1, MLH1, MSH2, MSH3, MSH6, MUTYH, NBN, NF1, NTHL1, PALB2, PMS2, POLD1, POLE, PTEN, RAD51C, RAD51D, RECQL, SMAD4, SMARCA4, STK11, and TP53.      CHIEF COMPLIANT: Follow-up of history of bilateral breast cancer   INTERVAL HISTORY: Sharon Phillips is a 52 y.o. female seen for follow-up of bilateral breast cancer.  She was last seen by Dr. Ellin Saba on 09/04/2022.  Today, Sharon Phillips reports she is tolerating exemestane therapy well. She reports that last week she had several days of diarrhea with nausea and vomiting. She is not certain what triggered the symptoms but it stopped on its own. She did take antinausea medication with improved her nausea. She was not sure what to take for diarrhea.   Otherwise she is feeling well. She reports having good energy and appetite. She is able to complete her ADLs on her own. She reports having some headaches with dizziness when her BP is elevated at home. She adds that her SBP at home is around 130-140's. Her lisinopril dose was increased last month from 10 mg BID to TID. She denies fevers, chills, sweats, shortness of breath, chest pain or cough. She has no other complaints.  Rest of the ROS is below.   REVIEW OF SYSTEMS:   Constitutional: Negative for appetite change, fatigue, chills, fever and unexpected weight change HENT: Negative for mouth sores, nosebleeds, sore throat and trouble swallowing.   Eyes: Negative for eye problems and icterus.  Respiratory: Negative for cough, hemoptysis, shortness of breath and wheezing.   Cardiovascular: Negative for chest pain and leg swelling.  Gastrointestinal: Negative for abdominal pain, constipation. +diarrhea, nausea and vomiting.  Genitourinary: Negative for bladder incontinence, difficulty urinating, dysuria, frequency and hematuria.   Musculoskeletal: Negative for back pain, gait problem, neck pain and neck stiffness.  Skin:Negative for rash and ulcers Neurological: Negative for extremity weakness, gait problem, light-headedness and seizures. +headaches and dizziness with increased BP. Hematological: Negative for adenopathy. Does not bruise/bleed easily.  Psychiatric/Behavioral: Negative for confusion, depression and sleep disturbance. The patient is not nervous/anxious.      I have reviewed the past medical history, past surgical history, social history and family history with the patient and they are unchanged from previous note.   ALLERGIES:   is allergic to chlorhexidine, hibiclens [chlorhexidine gluconate], and povidone-iodine.   MEDICATIONS:  Current Outpatient Medications  Medication Sig Dispense Refill   acetaminophen (TYLENOL) 325 MG tablet Take 650 mg by mouth every 6 (six) hours as needed for mild pain, moderate pain, fever or headache.     AJOVY 225 MG/1.5ML SOAJ Inject into the skin every 30 (thirty) days.     Butalbital-Acetaminophen 50-300 MG CAPS Take 1 capsule by mouth every 4 (four) hours as needed for headache.     cetirizine (ZYRTEC) 10  MG tablet Take 10 mg by mouth daily.     clonazePAM (KLONOPIN) 0.5 MG tablet Take 0.5 mg by mouth 2 (two) times daily as needed for anxiety.     exemestane  (AROMASIN) 25 MG tablet Take 1 tablet by mouth once daily 30 tablet 6   fluticasone (FLONASE) 50 MCG/ACT nasal spray Place into both nostrils daily.     gabapentin (NEURONTIN) 300 MG capsule Take 300 mg by mouth 2 (two) times daily.     lisinopril (ZESTRIL) 10 MG tablet Take 10 mg by mouth 2 (two) times daily.     meclizine (ANTIVERT) 12.5 MG tablet Take 12.5 mg by mouth daily as needed for dizziness.     metoprolol succinate (TOPROL-XL) 25 MG 24 hr tablet 1 tablet     Multiple Vitamin (MULTIVITAMIN) capsule Take 1 capsule by mouth daily.     NURTEC 75 MG TBDP Take by mouth.     ondansetron (ZOFRAN-ODT) 8 MG disintegrating tablet Take 8 mg by mouth every 8 (eight) hours as needed for nausea or vomiting.     prochlorperazine (COMPAZINE) 10 MG tablet Take 10 mg by mouth every 6 (six) hours as needed for nausea or vomiting.     QUEtiapine (SEROQUEL) 50 MG tablet Take 50 mg by mouth daily as needed (mood).     rosuvastatin (CRESTOR) 5 MG tablet Take 5 mg by mouth daily.     sertraline (ZOLOFT) 50 MG tablet Take 50 mg by mouth daily.     SUMAtriptan (IMITREX) 100 MG tablet Take 100 mg by mouth every 2 (two) hours as needed for migraine. May repeat in 2 hours if headache persists or recurs.     topiramate (TOPAMAX) 25 MG tablet Take 25 mg by mouth 2 (two) times daily.     triamcinolone (KENALOG) 0.025 % cream Apply 1 application topically 2 (two) times daily.     venlafaxine XR (EFFEXOR-XR) 150 MG 24 hr capsule Take 150 mg by mouth daily.     vitamin E 180 MG (400 UNITS) capsule Take 400 Units by mouth daily.     No current facility-administered medications for this visit.     PHYSICAL EXAMINATION: Performance status (ECOG): 0 - Asymptomatic  There were no vitals filed for this visit.  Wt Readings from Last 3 Encounters:  10/15/22 169 lb 14.4 oz (77.1 kg)  09/04/22 168 lb 4.8 oz (76.3 kg)  03/02/22 174 lb 4 oz (79 kg)    Constitutional: Oriented to person, place, and time and  well-developed, well-nourished, and in no distress.  HENT:  Head: Normocephalic and atraumatic.  Eyes: Conjunctivae are normal. Right eye exhibits no discharge. Left eye exhibits no discharge. No scleral icterus.  Neck: Normal range of motion. Neck supple.   Cardiovascular: Normal rate, regular rhythm, normal heart sounds Pulmonary/Chest: Effort normal and breath sounds normal. No respiratory distress. No wheezes. No rales.  Musculoskeletal: Normal range of motion. Exhibits no edema.  Lymphadenopathy: No cervical adenopathy.  Neurological: Alert and oriented to person, place, and time. Exhibits normal muscle tone. Gait normal. Coordination normal.  Skin: Skin is warm and dry. No rash noted. Not diaphoretic. No erythema. No pallor.  Psychiatric: Mood, memory and judgment normal.  Breast: Palpable nodularity in lower aspect of implants b/l. No axillary adenopathy. No changes in skin or concerning lesions.     LABORATORY DATA:  I have reviewed the data as listed    Latest Ref Rng & Units 03/06/2023   12:05 PM 10/15/2022  3:30 PM 08/28/2022    9:27 AM  CMP  Glucose 70 - 99 mg/dL 87  811  914   BUN 6 - 20 mg/dL 11  15  13    Creatinine 0.44 - 1.00 mg/dL 7.82  9.56  2.13   Sodium 135 - 145 mmol/L 140  139  138   Potassium 3.5 - 5.1 mmol/L 3.7  3.6  3.9   Chloride 98 - 111 mmol/L 106  104  111   CO2 22 - 32 mmol/L 26  28  22    Calcium 8.9 - 10.3 mg/dL 9.0  9.0  8.8   Total Protein 6.5 - 8.1 g/dL 7.5   7.1   Total Bilirubin 0.3 - 1.2 mg/dL 0.5   1.0   Alkaline Phos 38 - 126 U/L 143   125   AST 15 - 41 U/L 30   33   ALT 0 - 44 U/L 31   31    Lab Results  Component Value Date   CAN153 21.5 03/06/2023   CAN153 18.6 08/28/2022   CAN153 20.5 01/19/2022   Lab Results  Component Value Date   WBC 4.3 03/06/2023   HGB 14.8 03/06/2023   HCT 44.5 03/06/2023   MCV 96.5 03/06/2023   PLT 178 03/06/2023   NEUTROABS 2.0 03/06/2023    ASSESSMENT:  1.  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  Changes consistent with HPV effect.  Uterus, cervix, bilateral fallopian tubes and ovaries are negative for malignancy.  2.  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 - Non-smoker. - No family history of malignancies.   PLAN:  1.  T1CN1A right breast IDC, ER/PR positive, HER2 negative and T1CN1A left breast TNBC: - Labs from 03/06/2023 were reviewed. No cytopenias. Creatinine and LFTs normal. Tumor markers normal.  -- Physical exam findings were unremarkable.  - Tolerating exemstane well without any prohibitive toxicities. Recommend to continue exemestane daily. - RTC 6 months for follow-up with repeat labs including tumor markers.  2.  Peripheral neuropathy: - She is taking gabapentin 300 mg TID daily. - Symptoms have improved. Continue on gabapentin.  3.  Bone health: - Continue calcium and vitamin D supplements. - Vitamin D level is normal from 03/06/2023 -- Due for bone scan , scheduled for 08/16/2023.   4.  Nausea: - She has a chronic intermittent nausea since he was treated with chemotherapy. - Continue Zofran and Compazine as needed.  5. Diarrhea: -- Recommend to try OTC imodium   6. Hypertension: -- Encouraged patient to  have a daily BP log and follow up with PCP if medications need to be further modified.   Orders placed this encounter:  No orders of the defined types were placed in this encounter.  The patient has a good understanding of the overall plan. She agrees with it.  She will call with any problems that may develop before the next visit here.  I have spent a total of 30 minutes minutes of face-to-face and non-face-to-face time, preparing to see the patient, performing a medically appropriate examination, counseling and educating the patient, documenting clinical information in the electronic health record, and care coordination.   Georga Kaufmann PA-C Dept of Hematology and Oncology [facility]

## 2023-03-23 NOTE — Progress Notes (Unsigned)
NEUROLOGY CONSULTATION NOTE  Sharon Phillips MRN: [id number] DOB: May 15, 1971  Referring provider: Colvin Caroli, FNP Primary care provider: Colvin Caroli, FNP  Reason for consult:  headache  Assessment/Plan:   Chronic migraine without aura, without status migrainosus, not intractable  Migraine prevention:  Will restart Botox.  Continue Ajovy and topiramate  BID Migraine rescue:  sumatriptan and Nurtec.  Zofran or Compazine as needed Limit use of pain relievers to no more than 2 days out of week to prevent risk of rebound or medication-overuse headache. Keep headache diary Follow up for Botox    Subjective:  Sharon Phillips is a 52 year old female with HTN and history of breast cancer s/p mastectomy who presents for headaches.  History supplemented by referring provider's note.  Onset:  Occasional migraines since childhood.  Started getting worse n 2019 during chemotherapy for breast cancer.  Continued after she finished chemotherapy.  She was doing well on Botox.  However, her prior neurologist retired and she hasn't had Botox since November.  Migraines have worsened.   Location:  diffuse, bandlike Quality:  squeezing, pounding Intensity:  severe Aura:  absent Prodrome:  absent Associated symptoms:  Nausea, photophobia, phonophobia, sometimes sees flashing light.  She denies associated unilateral numbness or weakness. Duration:  1 day when treated and on Botox.  Hasn't had Botox since November.  No lasts 2 days. Frequency:  once a week when on Botox.  Prior neurologist retired.  Hasn't had Botox since November.  Now occurs 2 to 3 times a week (16-24 days a month) Triggers:  unknown Relieving factors:  rest Activity:  aggravates  CT Head on 10/15/2022 personally reviewed was normal.   Past NSAIDS/analgesics:  ibuprofen, Tylenol Past abortive triptans:  none Past abortive ergotamine:  none Past muscle relaxants:  none Past anti-emetic:  none Past antihypertensive medications:   none Past antidepressant medications:  none Past anticonvulsant medications:  none Past anti-CGRP:  none Past vitamins/Herbal/Supplements:  none Past antihistamines/decongestants:  none Other past therapies:  none  Current NSAIDS/analgesics:  Fioricet Current triptans:  sumatriptan  Current ergotamine:  none Current anti-emetic:  ondansetron, Compazine  Current muscle relaxants:  none Current Antihypertensive medications: metoprolol succinate ER, lisinopril Current Antidepressant medications:  venlafaxine ER  daily, sertraline  daily Current Anticonvulsant medications:  topiramate  twice daily, gabapentin  BID Current anti-CGRP:  Ajovy, Nurtec PRN  Current Vitamins/Herbal/Supplements:  E Current Antihistamines/Decongestants:  Flonase, Zyrtec Other therapy:  none Birth control:  none Other medications:  quetiapine  QHS, clonazepam QHS PRN   Caffeine:  no Diet:  water.  No soda.  Does not skip meals Exercise:  walksing Depression:  off and on; Anxiety:  yes Sleep hygiene:  wakes up frequently Family history of headache:  no      PAST MEDICAL HISTORY: Past Medical History:  Diagnosis Date   Anxiety    Cancer (HCC)    Depression    High cholesterol    Hypertension     PAST SURGICAL HISTORY: Past Surgical History:  Procedure Laterality Date   ABDOMINAL HYSTERECTOMY     CENTRAL VENOUS CATHETER INSERTION Right 06/27/2021   Procedure: INSERTION CENTRAL LINE ADULT, tunneled;  Surgeon: Lucretia Roers, MD;  Location: [facility];  Service: General;  Laterality: Right;   MASTECTOMY     bilateral    MEDICATIONS: Current Outpatient Medications on File Prior to Visit  Medication Sig Dispense Refill   acetaminophen (TYLENOL) 325 MG tablet Take 650 mg by mouth every 6 (six) hours as needed  for mild pain, moderate pain, fever or headache.     AJOVY 225 MG/1.5ML SOAJ Inject into the skin every 30 (thirty) days.     Butalbital-Acetaminophen 50-300  MG CAPS Take 1 capsule by mouth every 4 (four) hours as needed for headache.     cetirizine (ZYRTEC) 10 MG tablet Take 10 mg by mouth daily.     cholecalciferol (VITAMIN D3) 25 MCG (1000 UNIT) tablet Take 1,000 Units by mouth daily.     clonazePAM (KLONOPIN) 0.5 MG tablet Take 0.5 mg by mouth 2 (two) times daily as needed for anxiety.     exemestane (AROMASIN) 25 MG tablet Take 1 tablet by mouth once daily 30 tablet 6   fluticasone (FLONASE) 50 MCG/ACT nasal spray Place into both nostrils daily.     gabapentin (NEURONTIN) 300 MG capsule Take 300 mg by mouth 2 (two) times daily.     lisinopril (ZESTRIL) 10 MG tablet Take 10 mg by mouth 3 (three) times daily.     meclizine (ANTIVERT) 12.5 MG tablet Take 12.5 mg by mouth daily as needed for dizziness.     metoprolol succinate (TOPROL-XL) 25 MG 24 hr tablet 1 tablet     Multiple Vitamin (MULTIVITAMIN) capsule Take 1 capsule by mouth daily.     NURTEC 75 MG TBDP Take by mouth.     ondansetron (ZOFRAN-ODT) 8 MG disintegrating tablet Take 8 mg by mouth every 8 (eight) hours as needed for nausea or vomiting. (Patient not taking: Reported on 03/15/2023)     prochlorperazine (COMPAZINE) 10 MG tablet Take 10 mg by mouth every 6 (six) hours as needed for nausea or vomiting. (Patient not taking: Reported on 03/15/2023)     QUEtiapine (SEROQUEL) 50 MG tablet Take 50 mg by mouth daily as needed (mood).     rosuvastatin (CRESTOR) 5 MG tablet Take 5 mg by mouth daily.     sertraline (ZOLOFT) 50 MG tablet Take 50 mg by mouth daily.     SUMAtriptan (IMITREX) 100 MG tablet Take 100 mg by mouth every 2 (two) hours as needed for migraine. May repeat in 2 hours if headache persists or recurs.     topiramate (TOPAMAX) 25 MG tablet Take 25 mg by mouth 2 (two) times daily.     triamcinolone (KENALOG) 0.025 % cream Apply 1 application topically 2 (two) times daily.     venlafaxine XR (EFFEXOR-XR) 150 MG 24 hr capsule Take 150 mg by mouth daily.     vitamin E 180 MG (400  UNITS) capsule Take 400 Units by mouth daily.     No current facility-administered medications on file prior to visit.    ALLERGIES: Allergies  Allergen Reactions   Chlorhexidine Itching    Other reaction(s): Unknown   Hibiclens [Chlorhexidine Gluconate]    Povidone-Iodine     Other reaction(s): itching, Unknown    FAMILY HISTORY: Family History  Problem Relation Age of Onset   Breast cancer Maternal Aunt    Ovarian cancer Maternal Aunt     Objective:  Blood pressure 138/82, pulse 87, height  (1.549 m), weight 177 lb (80.3 kg), SpO2 98 %. General: No acute distress.  Patient appears well-groomed.   Head:  Normocephalic/atraumatic Eyes:  fundi examined but not visualized Neck: supple, no paraspinal tenderness, full range of motion Back: No paraspinal tenderness Heart: regular rate and rhythm Lungs: Clear to auscultation bilaterally. Vascular: No carotid bruits. Neurological Exam: Mental status: alert and oriented to person, place, and time, speech fluent and not  dysarthric, language intact. Cranial nerves: CN I: not tested CN II: pupils equal, round and reactive to light, visual fields intact CN III, IV, VI:  full range of motion, no nystagmus, no ptosis CN V: facial sensation intact. CN VII: upper and lower face symmetric CN VIII: hearing intact CN IX, X: gag intact, uvula midline CN XI: sternocleidomastoid and trapezius muscles intact CN XII: tongue midline Bulk & Tone: normal, no fasciculations. Motor:  muscle strength 5/5 throughout Sensation:  Pinprick, temperature and vibratory sensation intact. Deep Tendon Reflexes:  2+ throughout,  toes downgoing.   Finger to nose testing:  Without dysmetria.   Heel to shin:  Without dysmetria.   Gait:  Normal station and stride.  Romberg negative.    Thank you for allowing me to take part in the care of this patient.  Shon Millet, DO  CC: Colvin Caroli, FNP

## 2023-03-27 ENCOUNTER — Encounter: Payer: Self-pay | Admitting: Neurology

## 2023-03-27 ENCOUNTER — Ambulatory Visit (INDEPENDENT_AMBULATORY_CARE_PROVIDER_SITE_OTHER): Payer: Medicare Other | Admitting: Neurology

## 2023-03-27 VITALS — BP 138/82 | HR 87 | Ht 61.0 in | Wt 177.0 lb

## 2023-03-27 DIAGNOSIS — G43709 Chronic migraine without aura, not intractable, without status migrainosus: Secondary | ICD-10-CM

## 2023-03-27 NOTE — Patient Instructions (Signed)
Will try to get you restarted on Botox Continue Ajovy and topiramate Nurtec and sumatriptan as needed. Compazine or Zofran as needed for nausea

## 2023-03-30 ENCOUNTER — Telehealth: Payer: Self-pay

## 2023-03-30 DIAGNOSIS — Z9189 Other specified personal risk factors, not elsewhere classified: Secondary | ICD-10-CM

## 2023-03-30 DIAGNOSIS — Z1509 Genetic susceptibility to other malignant neoplasm: Secondary | ICD-10-CM

## 2023-03-30 NOTE — Telephone Encounter (Signed)
-----   Message from Missy Sabins, RN sent at 03/29/2023  4:43 PM EDT -----  ----- Message ----- From: Lucky Cowboy, RN Sent: 03/18/2023   8:00 AM EDT To: Orion Modest, RN  Patient needs MRI/MRCP. Refer to result note 4/6.

## 2023-03-30 NOTE — Telephone Encounter (Signed)
Called and spoke with patient to remind her that she is due for 1-year repeat MRI/MRCP at this time. Pt knows to expect a call from radiology scheduling to set up her appt. Pt verbalized understanding and had no concerns at the end of the call.   MRCP order in epic. Secure staff message sent to radiology scheduling to contact patient to set up appt.

## 2023-04-01 ENCOUNTER — Other Ambulatory Visit (HOSPITAL_COMMUNITY): Payer: Self-pay | Admitting: Gastroenterology

## 2023-04-01 ENCOUNTER — Ambulatory Visit (HOSPITAL_COMMUNITY)
Admission: RE | Admit: 2023-04-01 | Discharge: 2023-04-01 | Disposition: A | Discharge status: Home / Self Care | Payer: Medicare Other | Source: Ambulatory Visit | Attending: Gastroenterology | Admitting: Gastroenterology

## 2023-04-01 DIAGNOSIS — Z9189 Other specified personal risk factors, not elsewhere classified: Secondary | ICD-10-CM

## 2023-04-01 DIAGNOSIS — Z1589 Genetic susceptibility to other disease: Secondary | ICD-10-CM

## 2023-04-01 DIAGNOSIS — Z1501 Genetic susceptibility to malignant neoplasm of breast: Secondary | ICD-10-CM | POA: Diagnosis present

## 2023-04-01 DIAGNOSIS — Z1509 Genetic susceptibility to other malignant neoplasm: Secondary | ICD-10-CM

## 2023-04-01 MED ORDER — GADOBUTROL 1 MMOL/ML IV SOLN
8.0000 mL | Freq: Once | INTRAVENOUS | Status: AC | PRN
  Administered 2023-04-01: 8 mL via INTRAVENOUS

## 2023-04-04 NOTE — Telephone Encounter (Signed)
MRCP completed

## 2023-05-26 ENCOUNTER — Other Ambulatory Visit: Payer: Self-pay | Admitting: Hematology

## 2023-05-26 DIAGNOSIS — C50411 Malignant neoplasm of upper-outer quadrant of right female breast: Secondary | ICD-10-CM

## 2023-05-26 DIAGNOSIS — Z853 Personal history of malignant neoplasm of breast: Secondary | ICD-10-CM

## 2023-06-13 ENCOUNTER — Telehealth: Payer: Self-pay | Admitting: Neurology

## 2023-06-13 NOTE — Telephone Encounter (Signed)
Patient called stating that she could not refill her medication for headaches/KB

## 2023-06-14 ENCOUNTER — Telehealth: Payer: Self-pay | Admitting: Pharmacy Technician

## 2023-06-14 ENCOUNTER — Other Ambulatory Visit: Payer: Self-pay

## 2023-06-14 ENCOUNTER — Telehealth: Payer: Self-pay

## 2023-06-14 ENCOUNTER — Other Ambulatory Visit (HOSPITAL_COMMUNITY): Payer: Self-pay

## 2023-06-14 DIAGNOSIS — G43709 Chronic migraine without aura, not intractable, without status migrainosus: Secondary | ICD-10-CM

## 2023-06-14 MED ORDER — BOTOX 200 UNITS IJ SOLR
INTRAMUSCULAR | 4 refills | Status: AC
Start: 2023-06-14 — End: ?
  Filled 2023-06-14: qty 1, fill #0
  Filled 2023-06-15: qty 1, 90d supply, fill #0
  Filled 2023-09-06: qty 1, 90d supply, fill #1
  Filled 2023-12-13: qty 1, 90d supply, fill #2
  Filled 2024-03-11: qty 1, 90d supply, fill #3
  Filled 2024-06-13: qty 1, 90d supply, fill #4

## 2023-06-14 MED ORDER — AJOVY 225 MG/1.5ML ~~LOC~~ SOAJ: 225.0000 mg | SUBCUTANEOUS | 1 refills | Status: DC

## 2023-06-14 MED ORDER — TOPIRAMATE 25 MG PO TABS: 25.0000 mg | ORAL_TABLET | Freq: Two times a day (BID) | ORAL | 1 refills | Status: DC

## 2023-06-14 NOTE — Telephone Encounter (Signed)
Pharmacy Patient Advocate Encounter- Botox BIV-Pharmacy Benefit:  PA was submitted to [employer] and has been approved through: 7.11.24 - 7.11.25 Authorization# [id number]  Please send prescription to Specialty Pharmacy: [facility]: [phone]  Estimated Copay is: $11.20  Patient IS NOT eligible for Botox Copay Card, which will make patient's copay as little as zero. Copay card will be provided to pharmacy.   [employer] verification has been submitted. Benefit Verification:  [id number]  Pharmacy PA has been submitted for BOTOX 200 via CMM. INSURANCE: [employer] DATE SUBMITTED: 7.11.24 KEY: [id number]  Status is pending

## 2023-06-14 NOTE — Telephone Encounter (Signed)
PA needed for Botox 

## 2023-06-14 NOTE — Telephone Encounter (Signed)
PA approved see Prior Auth encounter for today.   Appt schedule for 7/19. Script sent to [facility]

## 2023-06-15 ENCOUNTER — Other Ambulatory Visit (HOSPITAL_COMMUNITY): Payer: Self-pay

## 2023-06-15 ENCOUNTER — Other Ambulatory Visit: Payer: Self-pay

## 2023-06-18 ENCOUNTER — Other Ambulatory Visit: Payer: Self-pay

## 2023-06-22 ENCOUNTER — Ambulatory Visit (INDEPENDENT_AMBULATORY_CARE_PROVIDER_SITE_OTHER): Payer: Medicare Other | Admitting: Neurology

## 2023-06-22 DIAGNOSIS — G43709 Chronic migraine without aura, not intractable, without status migrainosus: Secondary | ICD-10-CM

## 2023-06-22 MED ORDER — ONABOTULINUMTOXINA 100 UNITS IJ SOLR
200.0000 [IU] | Freq: Once | INTRAMUSCULAR | Status: AC
Start: 2023-06-22 — End: 2023-06-22
  Administered 2023-06-22: 155 [IU] via INTRAMUSCULAR

## 2023-06-22 NOTE — Progress Notes (Signed)

## 2023-06-24 ENCOUNTER — Telehealth: Payer: Self-pay | Admitting: Pharmacy Technician

## 2023-06-24 NOTE — Telephone Encounter (Signed)
Pharmacy Patient Advocate Encounter   Received notification from CoverMyMeds that prior authorization for AJOVY 225MG  is required/requested.   Insurance verification completed.   The patient is insured through [employer] .   Per test claim: PA submitted to [employer] via CoverMyMeds Key/confirmation #/EOC [id number] Status is pending

## 2023-08-16 ENCOUNTER — Ambulatory Visit (HOSPITAL_COMMUNITY)
Admission: RE | Admit: 2023-08-16 | Discharge: 2023-08-16 | Disposition: A | Discharge status: Home / Self Care | Payer: Medicare Other | Source: Ambulatory Visit | Attending: Physician Assistant | Admitting: Physician Assistant

## 2023-08-16 DIAGNOSIS — Z13820 Encounter for screening for osteoporosis: Secondary | ICD-10-CM | POA: Insufficient documentation

## 2023-08-16 DIAGNOSIS — C50411 Malignant neoplasm of upper-outer quadrant of right female breast: Secondary | ICD-10-CM | POA: Diagnosis present

## 2023-08-16 DIAGNOSIS — Z170 Estrogen receptor positive status [ER+]: Secondary | ICD-10-CM | POA: Insufficient documentation

## 2023-08-16 DIAGNOSIS — C50412 Malignant neoplasm of upper-outer quadrant of left female breast: Secondary | ICD-10-CM | POA: Diagnosis present

## 2023-08-16 DIAGNOSIS — Z853 Personal history of malignant neoplasm of breast: Secondary | ICD-10-CM | POA: Insufficient documentation

## 2023-08-16 DIAGNOSIS — Z780 Asymptomatic menopausal state: Secondary | ICD-10-CM | POA: Insufficient documentation

## 2023-08-16 DIAGNOSIS — M8588 Other specified disorders of bone density and structure, other site: Secondary | ICD-10-CM | POA: Diagnosis not present

## 2023-08-16 DIAGNOSIS — Z09 Encounter for follow-up examination after completed treatment for conditions other than malignant neoplasm: Secondary | ICD-10-CM | POA: Diagnosis present

## 2023-08-21 ENCOUNTER — Telehealth: Payer: Self-pay | Admitting: Physician Assistant

## 2023-08-21 NOTE — Telephone Encounter (Signed)
Attempted to contact patient to discuss results of her bone density scan from 08/16/2023.  Scan showed T-score -1.1, which is slightly decreased from T-score -0.8 that was done 2 years ago.  She is mildly osteopenic and is also taking exemestane for her history of breast cancer.  Per discussion with Dr. Ellin Saba, he recommends watchful waiting with repeat DEXA scan in 2 years along with continued calcium, vitamin D, and weightbearing exercises.  I will attempt to contact patient again tomorrow regarding the above.  Carnella Guadalajara, PA-C 08/21/23 4:46 PM

## 2023-08-22 NOTE — Telephone Encounter (Signed)
Discussed with Sharon Phillips via telephone the results of her bone density scan (see previous phone note documentation from 08/21/2023). She will continue to take exemestane as well as vitamin D. She is not yet taking calcium supplements, but was instructed to start taking OTC calcium 500 mg once daily. She verbalized understanding of the importance of increased weightbearing exercise. All questions were answered and patient verbalized understanding.  Carnella Guadalajara, PA-C  08/22/23 4:21 PM

## 2023-09-05 ENCOUNTER — Other Ambulatory Visit: Payer: Self-pay

## 2023-09-06 ENCOUNTER — Telehealth: Payer: Self-pay

## 2023-09-06 ENCOUNTER — Other Ambulatory Visit: Payer: Self-pay

## 2023-09-06 NOTE — Telephone Encounter (Signed)
Patient to call [facility] to get Botox delivered before her appt. Per Rep at [facility].   Patient advised, She will call today.

## 2023-09-06 NOTE — Progress Notes (Signed)
Specialty Pharmacy Refill Coordination Note  Sharon Phillips is a 52 y.o. female contacted today regarding refills of specialty medication(s) Onabotulinumtoxina   Patient requested Courier to Provider Office   Delivery date: 09/10/23   Verified address: [facility] [place], [address], [place] [address]   Medication will be filled on 09/07/23.

## 2023-09-07 ENCOUNTER — Other Ambulatory Visit (HOSPITAL_COMMUNITY): Payer: Self-pay

## 2023-09-07 ENCOUNTER — Other Ambulatory Visit: Payer: Self-pay

## 2023-09-12 ENCOUNTER — Inpatient Hospital Stay: Payer: Medicare Other | Attending: Hematology

## 2023-09-12 DIAGNOSIS — Z79811 Long term (current) use of aromatase inhibitors: Secondary | ICD-10-CM | POA: Diagnosis not present

## 2023-09-12 DIAGNOSIS — Z803 Family history of malignant neoplasm of breast: Secondary | ICD-10-CM | POA: Insufficient documentation

## 2023-09-12 DIAGNOSIS — G629 Polyneuropathy, unspecified: Secondary | ICD-10-CM | POA: Insufficient documentation

## 2023-09-12 DIAGNOSIS — Z90710 Acquired absence of both cervix and uterus: Secondary | ICD-10-CM | POA: Insufficient documentation

## 2023-09-12 DIAGNOSIS — M8580 Other specified disorders of bone density and structure, unspecified site: Secondary | ICD-10-CM | POA: Diagnosis not present

## 2023-09-12 DIAGNOSIS — Z90722 Acquired absence of ovaries, bilateral: Secondary | ICD-10-CM | POA: Diagnosis not present

## 2023-09-12 DIAGNOSIS — C50412 Malignant neoplasm of upper-outer quadrant of left female breast: Secondary | ICD-10-CM | POA: Diagnosis present

## 2023-09-12 DIAGNOSIS — C50411 Malignant neoplasm of upper-outer quadrant of right female breast: Secondary | ICD-10-CM | POA: Insufficient documentation

## 2023-09-12 DIAGNOSIS — Z9013 Acquired absence of bilateral breasts and nipples: Secondary | ICD-10-CM | POA: Diagnosis not present

## 2023-09-12 DIAGNOSIS — Z9221 Personal history of antineoplastic chemotherapy: Secondary | ICD-10-CM | POA: Diagnosis not present

## 2023-09-12 DIAGNOSIS — H538 Other visual disturbances: Secondary | ICD-10-CM | POA: Diagnosis not present

## 2023-09-12 DIAGNOSIS — Z170 Estrogen receptor positive status [ER+]: Secondary | ICD-10-CM | POA: Insufficient documentation

## 2023-09-12 DIAGNOSIS — Z8041 Family history of malignant neoplasm of ovary: Secondary | ICD-10-CM | POA: Diagnosis not present

## 2023-09-12 DIAGNOSIS — R110 Nausea: Secondary | ICD-10-CM | POA: Diagnosis not present

## 2023-09-12 DIAGNOSIS — Z79899 Other long term (current) drug therapy: Secondary | ICD-10-CM | POA: Insufficient documentation

## 2023-09-12 DIAGNOSIS — Z1721 Progesterone receptor positive status: Secondary | ICD-10-CM | POA: Insufficient documentation

## 2023-09-12 DIAGNOSIS — Z1732 Human epidermal growth factor receptor 2 negative status: Secondary | ICD-10-CM | POA: Insufficient documentation

## 2023-09-12 LAB — VITAMIN D 25 HYDROXY (VIT D DEFICIENCY, FRACTURES): Vit D, 25-Hydroxy: 32.98 ng/mL (ref 30–100)

## 2023-09-12 LAB — CBC WITH DIFFERENTIAL/PLATELET
Abs Immature Granulocytes: 0 10*3/uL (ref 0.00–0.07)
Basophils Absolute: 0 10*3/uL (ref 0.0–0.1)
Basophils Relative: 1 %
Eosinophils Absolute: 0.1 10*3/uL (ref 0.0–0.5)
Eosinophils Relative: 2 %
HCT: 46 % (ref 36.0–46.0)
Hemoglobin: 15.1 g/dL — ABNORMAL HIGH (ref 12.0–15.0)
Immature Granulocytes: 0 %
Lymphocytes Relative: 43 %
Lymphs Abs: 1.8 10*3/uL (ref 0.7–4.0)
MCH: 31.9 pg (ref 26.0–34.0)
MCHC: 32.8 g/dL (ref 30.0–36.0)
MCV: 97.3 fL (ref 80.0–100.0)
Monocytes Absolute: 0.2 10*3/uL (ref 0.1–1.0)
Monocytes Relative: 6 %
Neutro Abs: 2 10*3/uL (ref 1.7–7.7)
Neutrophils Relative %: 48 %
Platelets: 180 10*3/uL (ref 150–400)
RBC: 4.73 MIL/uL (ref 3.87–5.11)
RDW: 12.5 % (ref 11.5–15.5)
WBC: 4.2 10*3/uL (ref 4.0–10.5)
nRBC: 0 % (ref 0.0–0.2)

## 2023-09-12 LAB — COMPREHENSIVE METABOLIC PANEL
ALT: 39 U/L (ref 0–44)
AST: 33 U/L (ref 15–41)
Albumin: 4.2 g/dL (ref 3.5–5.0)
Alkaline Phosphatase: 121 U/L (ref 38–126)
Anion gap: 8 (ref 5–15)
BUN: 14 mg/dL (ref 6–20)
CO2: 25 mmol/L (ref 22–32)
Calcium: 9.1 mg/dL (ref 8.9–10.3)
Chloride: 104 mmol/L (ref 98–111)
Creatinine, Ser: 0.84 mg/dL (ref 0.44–1.00)
GFR, Estimated: >60 mL/min (ref >60)
Glucose, Bld: 117 mg/dL — ABNORMAL HIGH (ref 70–99)
Potassium: 3.8 mmol/L (ref 3.5–5.1)
Sodium: 137 mmol/L (ref 135–145)
Total Bilirubin: 0.8 mg/dL (ref 0.3–1.2)
Total Protein: 7.4 g/dL (ref 6.5–8.1)

## 2023-09-13 LAB — CANCER ANTIGEN 15-3: CA 15-3: 22.3 U/mL (ref 0.0–25.0)

## 2023-09-13 LAB — CANCER ANTIGEN 27.29: CA 27.29: 21.4 U/mL (ref 0.0–38.6)

## 2023-09-14 ENCOUNTER — Inpatient Hospital Stay: Payer: Medicare Other

## 2023-09-20 ENCOUNTER — Encounter: Payer: Self-pay | Admitting: Hematology

## 2023-09-20 ENCOUNTER — Inpatient Hospital Stay: Payer: Medicare Other | Admitting: Hematology

## 2023-09-20 VITALS — BP 160/96 | HR 92 | Temp 97.9°F | Resp 18 | Wt 175.0 lb

## 2023-09-20 DIAGNOSIS — C50411 Malignant neoplasm of upper-outer quadrant of right female breast: Secondary | ICD-10-CM | POA: Diagnosis not present

## 2023-09-20 DIAGNOSIS — C50412 Malignant neoplasm of upper-outer quadrant of left female breast: Secondary | ICD-10-CM

## 2023-09-20 DIAGNOSIS — Z170 Estrogen receptor positive status [ER+]: Secondary | ICD-10-CM | POA: Diagnosis not present

## 2023-09-20 NOTE — Progress Notes (Signed)
[facility] [address], [place] [address]    Clinic Day:  09/20/2023  Referring physician: Shelby Dubin, FNP  Patient Care Team: Shelby Dubin, FNP as PCP - General (Family Medicine) Doreatha Massed, MD as Medical Oncologist (Oncology) Therese Sarah, RN as Oncology Nurse Navigator (Oncology) Drema Dallas, DO as Consulting Physician (Neurology)   ASSESSMENT & PLAN:   Assessment: 1.  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 Changes consistent with HPV effect. Uterus, cervix, bilateral fallopian tubes and ovaries are negative for malignancy.  2.  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 - Non-smoker. - No family history of malignancies.   Plan: 1.  T1CN1A right breast IDC, ER/PR positive, HER2 negative and T1CN1A left breast TNBC: - She reports "pressure" in the left posterior temporoparietal region for the last 2 to 3 weeks.  This happens on and off and lasts about 3 hours.  This is associated with nausea and blurring of vision.  No recent infections/sinusitis. - Recommend brain MRI with and without contrast due to high risk disease. - She was reportedly seen by Dr. Odis Luster of plastic surgery on 08/10/2023.   A small subcutaneous nodule was felt lateral to the right mastectomy scar. - Physical examination today: I could not feel any subcutaneous nodule in this described region.  No other suspicious nodules palpable.  Bilateral implants are within normal limits. - We will schedule her for follow-up phone visit after brain MRI. - We reviewed labs from 09/12/2023: Normal LFTs and CBC.  Tumor markers are normal. - Otherwise she will come back in 6 months for follow-up with repeat labs and tumor marker.  2.  Peripheral neuropathy: - Continue gabapentin 300 mg 3 times daily.  This is well-controlled.  3.  Osteopenia (DEXA 08/16/2023, T-score -1.1): - Continue calcium and vitamin D supplements.  Vitamin D is normal at 32. - We discussed bone density results from 08/16/2023: T-score -1.1, osteopenia.  4.  Nausea: - She has chronic intermittent nausea.  Continue Zofran and Compazine as needed.   Breast Cancer therapy associated bone loss: I have recommended calcium, Vitamin D and weight bearing exercises.  Orders Placed This Encounter  Procedures   MR Brain W Wo Contrast    Standing Status:   Future    Standing Expiration Date:   09/19/2024    Order Specific Question:   If indicated for the ordered procedure, I authorize the administration of contrast media per Radiology protocol    Answer:   Yes    Order Specific Question:   What is the patient's sedation requirement?    Answer:   No Sedation    Order Specific Question:   Does the patient have a pacemaker or implanted devices?    Answer:   No    Order Specific Question:  Use SRS Protocol?    Answer:   No    Order Specific Question:   Preferred imaging location?    Answer:   [facility] (table limit [phone])    Order Specific Question:   Release to patient    Answer:   Immediate   CBC with Differential/Platelet    Standing Status:   Future    Standing Expiration Date:   09/19/2024    Order Specific Question:   Release to patient     Answer:   Immediate   Comprehensive metabolic panel    Standing Status:   Future    Standing Expiration Date:   09/19/2024    Order Specific Question:   Release to patient    Answer:   Immediate   Cancer antigen 15-3    Standing Status:   Future    Standing Expiration Date:   09/19/2024   Cancer antigen 27.29    Standing Status:   Future    Standing Expiration Date:   09/19/2024      Mikeal Hawthorne R Teague,acting as a scribe for Doreatha Massed, MD.,have documented all relevant documentation on the behalf of Doreatha Massed, MD,as directed by  Doreatha Massed, MD while in the presence of Doreatha Massed, MD.   I, Doreatha Massed MD, have reviewed the above documentation for accuracy and completeness, and I agree with the above.   Doreatha Massed, MD   10/17/20247:05 PM  CHIEF COMPLAINT:   Diagnosis: Malignant neoplasm of upper-outer quadrant of both breasts in female, ER+/PR+   Cancer Staging  Bilateral breast cancer [facility]) Staging form: Breast, AJCC 8th Edition - Clinical stage from 08/01/2021: Stage IB (cT1c, cN1, cM0, G2, ER+, PR+, HER2-) - Unsigned    Prior Therapy: Adjuvant chemotherapy followed by exemestane  Current Therapy: Exemestane   HISTORY OF PRESENT ILLNESS:   Oncology History  Bilateral breast cancer (HCC)  08/01/2021 Initial Diagnosis   Bilateral breast cancer (HCC)    Genetic Testing   [facility] CancerNext Panel identified one pathogenic 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 Report date is 02/06/2022.  The CancerNext gene panel offered by [employer] includes sequencing, rearrangement analysis, and RNA analysis for the following 36 genes:   APC, ATM, AXIN2, BARD1, BMPR1A, BRCA1, BRCA2, BRIP1, CDH1, CDK4, CDKN2A, CHEK2, DICER1, HOXB13, EPCAM, GREM1, MLH1, MSH2, MSH3, MSH6, MUTYH, NBN, NF1, NTHL1, PALB2, PMS2, POLD1, POLE, PTEN, RAD51C, RAD51D, RECQL, SMAD4, SMARCA4, STK11, and TP53.       INTERVAL HISTORY:   Sharon Phillips is a 52  y.o. female presenting to clinic today for follow up of history of bilateral breast cancer. She was last seen by me on 09/04/22.  Since her last visit, she had a DEXA scan on 08/16/23 with a T-score of -1.1, making her osteopenic.   Today, she states that she is doing well overall. Her appetite level is at 100%. Her energy level is at 100%.  She reports pressure in the back of the head that comes and goes, lasting for 3 hours at a time. She has associated nausea and blurry vision. Symptoms began 2-3 weeks ago. Pain medication does not improve symptom. She denies any recent falls, sinus/ear infections, or fevers. She is taking Gabapentin TID, and Zofran and compazine prn.   She felt a lump on her breast recently when at her PCP who referred her to Dr. Odis Luster, a plastic surgeon. Dr. Odis Luster then told her to follow-up with me for a subcutaneous mass on the right breast that is a few  millimeters in diameter. She has not had any mammograms since bilateral mastectomy on 05/21/2018.   PAST MEDICAL HISTORY:   Past Medical History: Past Medical History:  Diagnosis Date   Anxiety    Cancer (HCC)    Depression    High cholesterol    Hypertension     Surgical History: Past Surgical History:  Procedure Laterality Date   ABDOMINAL HYSTERECTOMY     CENTRAL VENOUS CATHETER INSERTION Right 06/27/2021   Procedure: INSERTION CENTRAL LINE ADULT, tunneled;  Surgeon: Lucretia Roers, MD;  Location: [facility];  Service: General;  Laterality: Right;   MASTECTOMY     bilateral    Social History: Social History   Socioeconomic History   Marital status: Legally Separated    Spouse name: Not on file   Number of children: 4   Years of education: Not on file   Highest education level: Not on file  Occupational History   Not on file  Tobacco Use   Smoking status: Never   Smokeless tobacco: Never  Vaping Use   Vaping status: Never Used  Substance and Sexual Activity   Alcohol use: Never   Drug use:  Never   Sexual activity: Not on file  Other Topics Concern   Not on file  Social History Narrative   Are you right handed or left handed? Right   Are you currently employed ?    What is your current occupation?   Do you live at home alone? NO   Who lives with you? Two kids   What type of home do you live in: 1 story or 2 story? 2       Social Determinants of Health   Financial Resource Strain: Medium Risk (08/01/2021)   Overall Financial Resource Strain (CARDIA)    Difficulty of Paying Living Expenses: Somewhat hard  Food Insecurity: Food Insecurity Present (08/01/2021)   Hunger Vital Sign    Worried About Running Out of Food in the Last Year: Sometimes true    Ran Out of Food in the Last Year: Sometimes true  Transportation Needs: No Transportation Needs (08/01/2021)   PRAPARE - Administrator, Civil Service (Medical): No    Lack of Transportation (Non-Medical): No  Physical Activity: Sufficiently Active (08/01/2021)   Exercise Vital Sign    Days of Exercise per Week: 7 days    Minutes of Exercise per Session: 30 min  Stress: No Stress Concern Present (08/01/2021)   [employer] of Occupational Health - Occupational Stress Questionnaire    Feeling of Stress : Only a little  Social Connections: Moderately Isolated (08/01/2021)   Social Connection and Isolation Panel [NHANES]    Frequency of Communication with Friends and Family: More than three times a week    Frequency of Social Gatherings with Friends and Family: More than three times a week    Attends Religious Services: 1 to 4 times per year    Active Member of [employer] or Organizations: No    Attends Banker Meetings: Never    Marital Status: Separated  Intimate Partner Violence: Not At Risk (06/12/2023)   [facility], [facility]   Humiliation, Afraid, Rape, and Kick questionnaire    Fear of Current or Ex-Partner: No    Emotionally Abused: No    Physically Abused: No     Sexually Abused: No    Family History: Family History  Problem Relation Age of Onset   Stroke Father  Breast cancer Maternal Aunt    Ovarian cancer Maternal Aunt     Current Medications:  Current Outpatient Medications:    acetaminophen (TYLENOL) 325 MG tablet, Take 650 mg by mouth every 6 (six) hours as needed for mild pain, moderate pain, fever or headache., Disp: , Rfl:    AJOVY 225 MG/1.5ML SOAJ, Inject 225 mg into the skin every 30 (thirty) days., Disp: 1.68 mL, Rfl: 1   botulinum toxin Type A (BOTOX) 200 units injection, Inject 155 units IM into multiple site in the face,neck and head once every 90 days, Disp: 1 each, Rfl: 4   Butalbital-Acetaminophen 50-300 MG CAPS, Take 1 capsule by mouth every 4 (four) hours as needed for headache., Disp: , Rfl:    cetirizine (ZYRTEC) 10 MG tablet, Take 10 mg by mouth daily., Disp: , Rfl:    cholecalciferol (VITAMIN D3) 25 MCG (1000 UNIT) tablet, Take 1,000 Units by mouth daily., Disp: , Rfl:    clonazePAM (KLONOPIN) 0.5 MG tablet, Take 0.5 mg by mouth 2 (two) times daily as needed for anxiety., Disp: , Rfl:    exemestane (AROMASIN) 25 MG tablet, Take 1 tablet by mouth once daily, Disp: 30 tablet, Rfl: 5   fluticasone (FLONASE) 50 MCG/ACT nasal spray, Place into both nostrils daily., Disp: , Rfl:    gabapentin (NEURONTIN) 300 MG capsule, Take 300 mg by mouth 2 (two) times daily., Disp: , Rfl:    lisinopril (ZESTRIL) 10 MG tablet, Take 10 mg by mouth 3 (three) times daily., Disp: , Rfl:    meclizine (ANTIVERT) 12.5 MG tablet, Take 12.5 mg by mouth daily as needed for dizziness., Disp: , Rfl:    metoprolol succinate (TOPROL-XL) 25 MG 24 hr tablet, 1 tablet, Disp: , Rfl:    Multiple Vitamin (MULTIVITAMIN) capsule, Take 1 capsule by mouth daily., Disp: , Rfl:    NURTEC 75 MG TBDP, Take by mouth., Disp: , Rfl:    ondansetron (ZOFRAN-ODT) 8 MG disintegrating tablet, Take 8 mg by mouth every 8 (eight) hours as needed for nausea or vomiting., Disp: ,  Rfl:    prochlorperazine (COMPAZINE) 10 MG tablet, Take 10 mg by mouth every 6 (six) hours as needed for nausea or vomiting., Disp: , Rfl:    QUEtiapine (SEROQUEL) 50 MG tablet, Take 50 mg by mouth daily as needed (mood)., Disp: , Rfl:    rosuvastatin (CRESTOR) 5 MG tablet, Take 5 mg by mouth daily., Disp: , Rfl:    sertraline (ZOLOFT) 50 MG tablet, Take 50 mg by mouth daily., Disp: , Rfl:    SUMAtriptan (IMITREX) 100 MG tablet, Take 100 mg by mouth every 2 (two) hours as needed for migraine. May repeat in 2 hours if headache persists or recurs., Disp: , Rfl:    topiramate (TOPAMAX) 25 MG tablet, Take 1 tablet (25 mg total) by mouth 2 (two) times daily., Disp: 60 tablet, Rfl: 1   venlafaxine XR (EFFEXOR-XR) 150 MG 24 hr capsule, Take 150 mg by mouth daily., Disp: , Rfl:    vitamin E 180 MG (400 UNITS) capsule, Take 400 Units by mouth daily., Disp: , Rfl:    Allergies: Allergies  Allergen Reactions   Chlorhexidine Itching    Other reaction(s): Unknown   Hibiclens [Chlorhexidine Gluconate]    Povidone-Iodine     Other reaction(s): itching, Unknown    REVIEW OF SYSTEMS:   Review of Systems  Constitutional:  Negative for chills, fatigue and fever.  HENT:   Positive for lump/mass. Negative for mouth sores,  nosebleeds, sore throat and trouble swallowing. Tinnitus: on right breast.  Eyes:  Negative for eye problems.  Respiratory:  Negative for cough and shortness of breath.   Cardiovascular:  Negative for chest pain, leg swelling and palpitations.  Gastrointestinal:  Negative for abdominal pain, constipation, diarrhea, nausea and vomiting.  Genitourinary:  Negative for bladder incontinence, difficulty urinating, dysuria, frequency, hematuria and nocturia.   Musculoskeletal:  Negative for arthralgias, back pain, flank pain, myalgias and neck pain.  Skin:  Negative for itching and rash.  Neurological:  Positive for dizziness and headaches. Negative for numbness.  Hematological:  Does not  bruise/bleed easily.  Psychiatric/Behavioral:  Positive for sleep disturbance. Negative for depression and suicidal ideas. The patient is not nervous/anxious.   All other systems reviewed and are negative.    VITALS:   Blood pressure (!) 160/96, pulse 92, temperature 97.9 F (36.6 C), temperature source Oral, resp. rate 18, weight 175 lb (79.4 kg), SpO2 97%.  Wt Readings from Last 3 Encounters:  09/20/23 175 lb (79.4 kg)  03/27/23 177 lb (80.3 kg)  03/15/23 175 lb 0.7 oz (79.4 kg)    Body mass index is 33.07 kg/m.  Performance status (ECOG): 0 - Asymptomatic  PHYSICAL EXAM:   Physical Exam Vitals and nursing note reviewed. Exam conducted with a chaperone present.  Constitutional:      Appearance: Normal appearance.  Cardiovascular:     Rate and Rhythm: Normal rate and regular rhythm.     Pulses: Normal pulses.     Heart sounds: Normal heart sounds.  Pulmonary:     Effort: Pulmonary effort is normal.     Breath sounds: Normal breath sounds.  Chest:     Comments: +bilateral implant sites are WNL and freely mobile +no suspicious masses Abdominal:     Palpations: Abdomen is soft. There is no hepatomegaly, splenomegaly or mass.     Tenderness: There is no abdominal tenderness.  Musculoskeletal:     Right lower leg: No edema.     Left lower leg: No edema.  Lymphadenopathy:     Cervical: No cervical adenopathy.     Right cervical: No superficial, deep or posterior cervical adenopathy.    Left cervical: No superficial, deep or posterior cervical adenopathy.     Upper Body:     Right upper body: No supraclavicular or axillary adenopathy.     Left upper body: No supraclavicular or axillary adenopathy.  Neurological:     General: No focal deficit present.     Mental Status: She is alert and oriented to person, place, and time.  Psychiatric:        Mood and Affect: Mood normal.        Behavior: Behavior normal.   Breast Exam Chaperone: Anne Fu, LPN   LABS:       Latest Ref Rng & Units 09/12/2023    1:39 PM 03/06/2023   12:05 PM 10/15/2022    3:30 PM  CBC  WBC 4.0 - 10.5 K/uL 4.2  4.3  5.8   Hemoglobin 12.0 - 15.0 g/dL 16.1  09.6  04.5   Hematocrit 36.0 - 46.0 % 46.0  44.5  43.6   Platelets 150 - 400 K/uL 180  178  171       Latest Ref Rng & Units 09/12/2023    1:39 PM 03/06/2023   12:05 PM 10/15/2022    3:30 PM  CMP  Glucose 70 - 99 mg/dL 409  87  811   BUN 6 - 20  mg/dL 14  11  15    Creatinine 0.44 - 1.00 mg/dL 1.61  0.96  0.45   Sodium 135 - 145 mmol/L 137  140  139   Potassium 3.5 - 5.1 mmol/L 3.8  3.7  3.6   Chloride 98 - 111 mmol/L 104  106  104   CO2 22 - 32 mmol/L 25  26  28    Calcium 8.9 - 10.3 mg/dL 9.1  9.0  9.0   Total Protein 6.5 - 8.1 g/dL 7.4  7.5    Total Bilirubin 0.3 - 1.2 mg/dL 0.8  0.5    Alkaline Phos 38 - 126 U/L 121  143    AST 15 - 41 U/L 33  30    ALT 0 - 44 U/L 39  31       No results found for: "CEA1", "CEA" / No results found for: "CEA1", "CEA" No results found for: "PSA1" No results found for: "WUJ811" No results found for: "CAN125"  No results found for: "TOTALPROTELP", "ALBUMINELP", "A1GS", "A2GS", "BETS", "BETA2SER", "GAMS", "MSPIKE", "SPEI" No results found for: "TIBC", "FERRITIN", "IRONPCTSAT" No results found for: "LDH"   STUDIES:   No results found.

## 2023-09-20 NOTE — Patient Instructions (Signed)
[facility]  Discharge Instructions  You were seen and examined today by Dr. Ellin Saba.  Dr. Ellin Saba discussed your most recent lab work which revealed that everything looks good and stable.  Dr. Ellin Saba is going to get an MRI of your brain since you are having the pressure in your head.  Follow-up as scheduled.    Thank you for choosing [facility] to provide your oncology and hematology care.   To afford each patient quality time with our provider, please arrive at least 15 minutes before your scheduled appointment time. You may need to reschedule your appointment if you arrive late (10 or more minutes). Arriving late affects you and other patients whose appointments are after yours.  Also, if you miss three or more appointments without notifying the office, you may be dismissed from the clinic at the provider's discretion.    Again, thank you for choosing [facility].  Our hope is that these requests will decrease the amount of time that you wait before being seen by our physicians.   If you have a lab appointment with the Cancer Center - please note that after April 8th, all labs will be drawn in the cancer center.  You do not have to check in or register with the main entrance as you have in the past but will complete your check-in at the cancer center.            _____________________________________________________________  Should you have questions after your visit to [facility], please contact our office at [phone] and follow the prompts.  Our office hours are 8:00 a.m. to 4:30 p.m. Monday - Thursday and 8:00 a.m. to 2:30 p.m. Friday.  Please note that voicemails left after 4:00 p.m. may not be returned until the following business day.  We are closed weekends and all major holidays.  You do have access to a nurse 24-7, just call the main number to the clinic [phone] and do not press  any options, hold on the line and a nurse will answer the phone.    For prescription refill requests, have your pharmacy contact our office and allow 72 hours.    Masks are no longer required in the cancer centers. If you would like for your care team to wear a mask while they are taking care of you, please let them know. You may have one support person who is at least 51 years old accompany you for your appointments.

## 2023-09-21 ENCOUNTER — Ambulatory Visit (INDEPENDENT_AMBULATORY_CARE_PROVIDER_SITE_OTHER): Payer: Medicare Other | Admitting: Neurology

## 2023-09-21 DIAGNOSIS — G43709 Chronic migraine without aura, not intractable, without status migrainosus: Secondary | ICD-10-CM

## 2023-09-21 MED ORDER — ONABOTULINUMTOXINA 100 UNITS IJ SOLR
200.0000 [IU] | Freq: Once | INTRAMUSCULAR | Status: AC
Start: 2023-09-21 — End: 2023-09-21
  Administered 2023-09-21: 155 [IU] via INTRAMUSCULAR

## 2023-09-21 MED ORDER — SUMATRIPTAN SUCCINATE 100 MG PO TABS: 100.0000 mg | ORAL_TABLET | ORAL | 5 refills | Status: DC | PRN

## 2023-09-21 NOTE — Progress Notes (Signed)
Botulinum Clinic  ° °Procedure Note Botox ° °Attending: Dr. Megen Madewell ° °Preoperative Diagnosis(es): Chronic migraine ° °Consent obtained from: The patient °Benefits discussed included, but were not limited to decreased muscle tightness, increased joint range of motion, and decreased pain.  Risk discussed included, but were not limited pain and discomfort, bleeding, bruising, excessive weakness, venous thrombosis, muscle atrophy and dysphagia.  Anticipated outcomes of the procedure as well as he risks and benefits of the alternatives to the procedure, and the roles and tasks of the personnel to be involved, were discussed with the patient, and the patient consents to the procedure and agrees to proceed. A copy of the patient medication guide was given to the patient which explains the blackbox warning. ° °Patients identity and treatment sites confirmed Yes.  . ° °Details of Procedure: °Skin was cleaned with alcohol. Prior to injection, the needle plunger was aspirated to make sure the needle was not within a blood vessel.  There was no blood retrieved on aspiration.   ° °Following is a summary of the muscles injected  And the amount of Botulinum toxin used: ° °Dilution °200 units of Botox was reconstituted with 4 ml of preservative free normal saline. °Time of reconstitution: At the time of the office visit (<30 minutes prior to injection)  ° °Injections  °155 total units of Botox was injected with a 30 gauge needle. ° °Injection Sites: °L occipitalis: 15 units- 3 sites  °R occiptalis: 15 units- 3 sites ° °L upper trapezius: 15 units- 3 sites °R upper trapezius: 15 units- 3 sits          °L paraspinal: 10 units- 2 sites °R paraspinal: 10 units- 2 sites ° °Face °L frontalis(2 injection sites):10 units   °R frontalis(2 injection sites):10 units         °L corrugator: 5 units   °R corrugator: 5 units           °Procerus: 5 units   °L temporalis: 20 units °R temporalis: 20 units  ° °Agent:  °200 units of botulinum Type  A (Onobotulinum Toxin type A) was reconstituted with 4 ml of preservative free normal saline.  °Time of reconstitution: At the time of the office visit (<30 minutes prior to injection)  ° ° ° Total injected (Units):  155 ° Total wasted (Units):  45 ° °Patient tolerated procedure well without complications.   °Reinjection is anticipated in 3 months. ° ° °

## 2023-09-26 ENCOUNTER — Ambulatory Visit (HOSPITAL_COMMUNITY)
Admission: RE | Admit: 2023-09-26 | Discharge: 2023-09-26 | Disposition: A | Discharge status: Home / Self Care | Payer: Medicare Other | Source: Ambulatory Visit | Attending: Hematology | Admitting: Hematology

## 2023-09-26 DIAGNOSIS — C50412 Malignant neoplasm of upper-outer quadrant of left female breast: Secondary | ICD-10-CM | POA: Insufficient documentation

## 2023-09-26 DIAGNOSIS — C50411 Malignant neoplasm of upper-outer quadrant of right female breast: Secondary | ICD-10-CM | POA: Insufficient documentation

## 2023-09-26 DIAGNOSIS — Z170 Estrogen receptor positive status [ER+]: Secondary | ICD-10-CM | POA: Diagnosis present

## 2023-09-26 MED ORDER — GADOBUTROL 1 MMOL/ML IV SOLN
7.0000 mL | Freq: Once | INTRAVENOUS | Status: AC | PRN
  Administered 2023-09-26: 7 mL via INTRAVENOUS

## 2023-10-03 ENCOUNTER — Inpatient Hospital Stay: Payer: Medicare Other | Admitting: Hematology

## 2023-10-03 DIAGNOSIS — C50911 Malignant neoplasm of unspecified site of right female breast: Secondary | ICD-10-CM | POA: Diagnosis not present

## 2023-10-03 DIAGNOSIS — M899 Disorder of bone, unspecified: Secondary | ICD-10-CM | POA: Diagnosis not present

## 2023-10-03 DIAGNOSIS — C50411 Malignant neoplasm of upper-outer quadrant of right female breast: Secondary | ICD-10-CM

## 2023-10-03 DIAGNOSIS — Z170 Estrogen receptor positive status [ER+]: Secondary | ICD-10-CM | POA: Diagnosis not present

## 2023-10-03 DIAGNOSIS — Z79811 Long term (current) use of aromatase inhibitors: Secondary | ICD-10-CM

## 2023-10-03 DIAGNOSIS — C50912 Malignant neoplasm of unspecified site of left female breast: Secondary | ICD-10-CM

## 2023-10-03 NOTE — Progress Notes (Signed)
Virtual Visit via Telephone Note  I connected with Sharon Phillips on 10/03/23 at  4:00 PM EDT by telephone and verified that I am speaking with the correct person using two identifiers.  Location: Patient: At home Provider: At office   I discussed the limitations, risks, security and privacy concerns of performing an evaluation and management service by telephone and the availability of in person appointments. I also discussed with the patient that there may be a patient responsible charge related to this service. The patient expressed understanding and agreed to proceed.   History of Present Illness:  Sharon Phillips is a 52 y.o. female being called for results of her brain MRI on 09/26/23 that found: no evidence of intracranial metastatic disease and a 1.4 cm enhancing lesion in the right parietal bone suspicious for osseous metastatic disease. She was last seen by me in clinic on 09/20/23 and is diagnosed with T1CN1A right breast IDC, ER/PR positive, HER2 negative and T1CN1A left breast TNBC. She is currently taking exemestane, gabapentin, calcium, and Vitamin D.    Observations/Objective: She continues to have "pressure" in the left posterior temporoparietal region for the past 2 to 3 weeks.  We have ordered MRI.  Assessment and Plan:  1.  Bilateral breast cancer: - I have reviewed MRI of the brain results.  There is a 1.4 cm enhancing lesion in the right parietal bone suspicious for bone metastatic disease.  No evidence of intracranial metastatic disease. - Recommend full body PET/CT scan for restaging.  RTC after PET scan.   Follow Up Instructions:  RTC after PET scan. I discussed the assessment and treatment plan with the patient. The patient was provided an opportunity to ask questions and all were answered. The patient agreed with the plan and demonstrated an understanding of the instructions.   The patient was advised to call back or seek an in-person evaluation if the symptoms worsen or  if the condition fails to improve as anticipated.  I provided 21 minutes of non-face-to-face time during this encounter.   Doreatha Massed, MD

## 2023-10-10 ENCOUNTER — Other Ambulatory Visit: Payer: Self-pay

## 2023-10-11 ENCOUNTER — Ambulatory Visit (HOSPITAL_COMMUNITY)
Admission: RE | Admit: 2023-10-11 | Discharge: 2023-10-11 | Disposition: A | Discharge status: Home / Self Care | Payer: Medicare Other | Source: Ambulatory Visit | Attending: Hematology | Admitting: Hematology

## 2023-10-11 DIAGNOSIS — Z170 Estrogen receptor positive status [ER+]: Secondary | ICD-10-CM | POA: Insufficient documentation

## 2023-10-11 DIAGNOSIS — C50411 Malignant neoplasm of upper-outer quadrant of right female breast: Secondary | ICD-10-CM | POA: Diagnosis present

## 2023-10-11 DIAGNOSIS — C50412 Malignant neoplasm of upper-outer quadrant of left female breast: Secondary | ICD-10-CM | POA: Insufficient documentation

## 2023-10-11 MED ORDER — FLUDEOXYGLUCOSE F - 18 (FDG) INJECTION
9.2300 | Freq: Once | INTRAVENOUS | Status: AC | PRN
  Administered 2023-10-11: 9.23 via INTRAVENOUS

## 2023-10-12 ENCOUNTER — Encounter: Payer: Self-pay | Admitting: Gastroenterology

## 2023-10-15 NOTE — Progress Notes (Signed)
[facility] [address], [place] [address]    Clinic Day:  10/16/2023  Referring physician: Shelby Dubin, FNP  Patient Care Team: Sharon Dubin, FNP as PCP - General (Family Medicine) Sharon Massed, Phillips as Medical Oncologist (Oncology) Sharon Sarah, RN as Oncology Nurse Navigator (Oncology) Sharon Dallas, DO as Consulting Physician (Neurology)   ASSESSMENT & PLAN:   Assessment: 1.  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 Changes consistent with HPV effect. Uterus, cervix, bilateral fallopian tubes and ovaries are negative for malignancy.  2.  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 - Non-smoker. - No family history of malignancies.   Plan: 1.  T1CN1A right breast IDC, ER/PR positive, HER2 negative and T1CN1A left breast TNBC: - She is accompanied by her daughter today. - She reports occasional dizziness.  She also reported feeling of "water drop running down on the left parietal region". - MRI of the brain on 09/26/2023: 1.4 cm enhancing lesion in the right parietal region suspicious for bone metastatic disease. - We have done PET CT scan (10/11/2023): - She will complete 5 years of exemestane in  January 2025.  I have discussed role of BCI testing.  She would like to proceed with 10 years of exemestane as she is tolerating it very well. - We will see her back in 1 year for follow-up with tumor markers and labs.  2.  Peripheral neuropathy: - Continue gabapentin 300 mg 3 times daily.  Well-controlled.  3.  Osteopenia (DEXA 08/16/2023, T-score -1.1): - Continue calcium and vitamin D supplements.  Vitamin D is normal at 32. - We discussed bone density results from 08/16/2023: T-score -1.1, osteopenia.  4.  Nausea: - Chronic intermittent nausea stable.  Continue Zofran and Compazine as needed.   Breast Cancer therapy associated bone loss: I have recommended calcium, Vitamin D and weight bearing exercises.  Orders Placed This Encounter  Procedures   VITAMIN D 25 Hydroxy (Vit-D Deficiency, Fractures)    Standing Status:   Future    Standing Expiration Date:   10/15/2024   CBC with Differential    Standing Status:   Future    Standing Expiration Date:   10/15/2024   Comprehensive metabolic panel    Standing Status:   Future    Standing Expiration Date:   10/15/2024   Cancer antigen 27.29    Standing Status:   Future    Standing Expiration Date:   10/15/2024   Cancer antigen 15-3    Standing Status:   Future    Standing Expiration Date:   10/15/2024      Sharon Phillips,acting as a scribe for Sharon Massed, Phillips.,have documented all relevant documentation on the behalf of Sharon Massed, Phillips,as directed by  Sharon Massed, Phillips while in the presence of Sharon Massed,  Phillips.  Sharon Phillips, have reviewed the above documentation for accuracy and completeness, and I agree with the above.    Sharon Massed, Phillips   11/12/20245:52 PM  CHIEF COMPLAINT:   Diagnosis: Malignant neoplasm of upper-outer quadrant of both breasts in female, ER+/PR+   Cancer Staging  Bilateral breast cancer [facility]) Staging form: Breast, AJCC 8th Edition - Clinical stage  from 08/01/2021: Stage IB (cT1c, cN1, cM0, G2, ER+, PR+, HER2-) - Unsigned    Prior Therapy: Adjuvant chemotherapy followed by exemestane  Current Therapy: Exemestane   HISTORY OF PRESENT ILLNESS:   Oncology History  Bilateral breast cancer (HCC)  08/01/2021 Initial Diagnosis   Bilateral breast cancer (HCC)    Genetic Testing   [facility] CancerNext Panel identified one pathogenic 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 Report date is 02/06/2022.  The CancerNext gene panel offered by [employer] includes sequencing, rearrangement analysis, and RNA analysis for the following 36 genes:   APC, ATM, AXIN2, BARD1, BMPR1A, BRCA1, BRCA2, BRIP1, CDH1, CDK4, CDKN2A, CHEK2, DICER1, HOXB13, EPCAM, GREM1, MLH1, MSH2, MSH3, MSH6, MUTYH, NBN, NF1, NTHL1, PALB2, PMS2, POLD1, POLE, PTEN, RAD51C, RAD51D, RECQL, SMAD4, SMARCA4, STK11, and TP53.       INTERVAL HISTORY:   Sharon Phillips is a 52 y.o. female presenting to clinic today for follow up of history of bilateral breast cancer. She was last seen by me on 09/20/23.  Since her last visit, she underwent restaging whole body PET on 10/11/23.   Today, she states that she is doing well overall. Her appetite level is at 75%. Her energy level is at 50%. She is accompanied by her daughter.   She reports a sensation of water running down her head on the left parietal side and right-sided pain in the head that has not resolved since her last visit. She denies any hot flashes, aches, or pains due to Exemestane. She reports dizziness. She denies any nausea and has not been taking Zofran or Compazine.   Her daughter states the patient has been getting more forgetful. Her daughter had genetic testing done that came back negative.   PAST MEDICAL HISTORY:   Past Medical History: Past Medical History:  Diagnosis Date   Anxiety    Cancer (HCC)    Depression    High cholesterol    Hypertension     Surgical History: Past Surgical History:  Procedure Laterality  Date   ABDOMINAL HYSTERECTOMY     CENTRAL VENOUS CATHETER INSERTION Right 06/27/2021   Procedure: INSERTION CENTRAL LINE ADULT, tunneled;  Surgeon: Sharon Roers, Phillips;  Location: [facility];  Service: General;  Laterality: Right;   MASTECTOMY     bilateral    Social History: Social History   Socioeconomic History   Marital status: Legally Separated    Spouse name: Not on file   Number of children: 4   Years of education: Not on file   Highest education level: Not on file  Occupational History   Not on file  Tobacco Use   Smoking status: Never   Smokeless tobacco: Never  Vaping Use   Vaping status: Never Used  Substance and Sexual Activity   Alcohol use: Never   Drug use: Never   Sexual activity: Not on file  Other Topics Concern   Not on file  Social History Narrative   Are you right handed or left handed? Right   Are you currently employed ?    What is your current occupation?   Do you live at  home alone? NO   Who lives with you? Two kids   What type of home do you live in: 1 story or 2 story? 2       Social Determinants of Health   Financial Resource Strain: Medium Risk (08/01/2021)   Overall Financial Resource Strain (CARDIA)    Difficulty of Paying Living Expenses: Somewhat hard  Food Insecurity: Food Insecurity Present (08/01/2021)   Hunger Vital Sign    Worried About Running Out of Food in the Last Year: Sometimes true    Ran Out of Food in the Last Year: Sometimes true  Transportation Needs: No Transportation Needs (08/01/2021)   PRAPARE - Administrator, Civil Service (Medical): No    Lack of Transportation (Non-Medical): No  Physical Activity: Sufficiently Active (08/01/2021)   Exercise Vital Sign    Days of Exercise per Week: 7 days    Minutes of Exercise per Session: 30 min  Stress: No Stress Concern Present (08/01/2021)   [employer] of Occupational Health - Occupational Stress Questionnaire    Feeling of Stress : Only a little  Social  Connections: Moderately Isolated (08/01/2021)   Social Connection and Isolation Panel [NHANES]    Frequency of Communication with Friends and Family: More than three times a week    Frequency of Social Gatherings with Friends and Family: More than three times a week    Attends Religious Services: 1 to 4 times per year    Active Member of [employer] or Organizations: No    Attends Banker Meetings: Never    Marital Status: Separated  Intimate Partner Violence: Not At Risk (06/12/2023)   [facility], [facility]   Humiliation, Afraid, Rape, and Kick questionnaire    Fear of Current or Ex-Partner: No    Emotionally Abused: No    Physically Abused: No    Sexually Abused: No    Family History: Family History  Problem Relation Age of Onset   Stroke Father    Breast cancer Maternal Aunt    Ovarian cancer Maternal Aunt     Current Medications:  Current Outpatient Medications:    acetaminophen (TYLENOL) 325 MG tablet, Take 650 mg by mouth every 6 (six) hours as needed for mild pain, moderate pain, fever or headache., Disp: , Rfl:    AJOVY 225 MG/1.5ML SOAJ, Inject 225 mg into the skin every 30 (thirty) days., Disp: 1.68 mL, Rfl: 1   botulinum toxin Type A (BOTOX) 200 units injection, Inject 155 units IM into multiple site in the face,neck and head once every 90 days, Disp: 1 each, Rfl: 4   Butalbital-Acetaminophen 50-300 MG CAPS, Take 1 capsule by mouth every 4 (four) hours as needed for headache., Disp: , Rfl:    cetirizine (ZYRTEC) 10 MG tablet, Take 10 mg by mouth daily., Disp: , Rfl:    cholecalciferol (VITAMIN D3) 25 MCG (1000 UNIT) tablet, Take 1,000 Units by mouth daily., Disp: , Rfl:    clonazePAM (KLONOPIN) 0.5 MG tablet, Take 0.5 mg by mouth 2 (two) times daily as needed for anxiety., Disp: , Rfl:    exemestane (AROMASIN) 25 MG tablet, Take 1 tablet by mouth once daily, Disp: 30 tablet, Rfl: 5   fluticasone (FLONASE) 50 MCG/ACT nasal spray, Place into  both nostrils daily., Disp: , Rfl:    gabapentin (NEURONTIN) 300 MG capsule, Take 300 mg by mouth 2 (two) times daily., Disp: , Rfl:    lisinopril (ZESTRIL) 10 MG tablet, Take  10 mg by mouth 3 (three) times daily., Disp: , Rfl:    meclizine (ANTIVERT) 12.5 MG tablet, Take 12.5 mg by mouth daily as needed for dizziness., Disp: , Rfl:    metoprolol succinate (TOPROL-XL) 25 MG 24 hr tablet, 1 tablet, Disp: , Rfl:    Multiple Vitamin (MULTIVITAMIN) capsule, Take 1 capsule by mouth daily., Disp: , Rfl:    NURTEC 75 MG TBDP, Take by mouth., Disp: , Rfl:    ondansetron (ZOFRAN-ODT) 8 MG disintegrating tablet, Take 8 mg by mouth every 8 (eight) hours as needed for nausea or vomiting., Disp: , Rfl:    prochlorperazine (COMPAZINE) 10 MG tablet, Take 10 mg by mouth every 6 (six) hours as needed for nausea or vomiting., Disp: , Rfl:    QUEtiapine (SEROQUEL) 50 MG tablet, Take 50 mg by mouth daily as needed (mood)., Disp: , Rfl:    rosuvastatin (CRESTOR) 5 MG tablet, Take 5 mg by mouth daily., Disp: , Rfl:    sertraline (ZOLOFT) 50 MG tablet, Take 50 mg by mouth daily., Disp: , Rfl:    SUMAtriptan (IMITREX) 100 MG tablet, Take 1 tablet (100 mg total) by mouth as needed for migraine. May repeat in 2 hours if headache persists or recurs.  Maximum 2 tablets in 24 hours., Disp: 10 tablet, Rfl: 5   topiramate (TOPAMAX) 25 MG tablet, Take 1 tablet (25 mg total) by mouth 2 (two) times daily., Disp: 60 tablet, Rfl: 1   venlafaxine XR (EFFEXOR-XR) 150 MG 24 hr capsule, Take 150 mg by mouth daily., Disp: , Rfl:    vitamin E 180 MG (400 UNITS) capsule, Take 400 Units by mouth daily., Disp: , Rfl:    Allergies: Allergies  Allergen Reactions   Chlorhexidine Itching    Other reaction(s): Unknown   Hibiclens [Chlorhexidine Gluconate]    Povidone-Iodine     Other reaction(s): itching, Unknown    REVIEW OF SYSTEMS:   Review of Systems  Constitutional:  Negative for chills, fatigue and fever.  HENT:   Negative for  lump/mass, mouth sores, nosebleeds, sore throat and trouble swallowing.        +right-sided head pain, 3/10 severity  Eyes:  Negative for eye problems.  Respiratory:  Negative for cough and shortness of breath.   Cardiovascular:  Negative for chest pain, leg swelling and palpitations.  Gastrointestinal:  Negative for abdominal pain, constipation, diarrhea, nausea and vomiting.  Endocrine: Negative for hot flashes.  Genitourinary:  Negative for bladder incontinence, difficulty urinating, dysuria, frequency, hematuria and nocturia.   Musculoskeletal:  Negative for arthralgias, back pain, flank pain, myalgias and neck pain.  Skin:  Negative for itching and rash.  Neurological:  Positive for dizziness, headaches and numbness (in arms and legs).  Hematological:  Does not bruise/bleed easily.  Psychiatric/Behavioral:  Positive for sleep disturbance. Negative for depression and suicidal ideas. The patient is not nervous/anxious.   All other systems reviewed and are negative.    VITALS:   Blood pressure (!) 154/111, pulse 88, temperature 98.1 F (36.7 C), temperature source Oral, resp. rate 16, weight 175 lb 3.2 oz (79.5 kg), SpO2 98%.  Wt Readings from Last 3 Encounters:  10/16/23 175 lb 3.2 oz (79.5 kg)  09/20/23 175 lb (79.4 kg)  03/27/23 177 lb (80.3 kg)    Body mass index is 33.1 kg/m.  Performance status (ECOG): 0 - Asymptomatic  PHYSICAL EXAM:   Physical Exam Vitals and nursing note reviewed. Exam conducted with a chaperone present.  Constitutional:  Appearance: Normal appearance.  Cardiovascular:     Rate and Rhythm: Normal rate and regular rhythm.     Pulses: Normal pulses.     Heart sounds: Normal heart sounds.  Pulmonary:     Effort: Pulmonary effort is normal.     Breath sounds: Normal breath sounds.  Abdominal:     Palpations: Abdomen is soft. There is no hepatomegaly, splenomegaly or mass.     Tenderness: There is no abdominal tenderness.  Musculoskeletal:      Right lower leg: No edema.     Left lower leg: No edema.  Lymphadenopathy:     Cervical: No cervical adenopathy.     Right cervical: No superficial, deep or posterior cervical adenopathy.    Left cervical: No superficial, deep or posterior cervical adenopathy.     Upper Body:     Right upper body: No supraclavicular or axillary adenopathy.     Left upper body: No supraclavicular or axillary adenopathy.  Neurological:     General: No focal deficit present.     Mental Status: She is alert and oriented to person, place, and time.  Psychiatric:        Mood and Affect: Mood normal.        Behavior: Behavior normal.   Breast Exam Chaperone: Anne Fu, LPN   LABS:      Latest Ref Rng & Units 09/12/2023    1:39 PM 03/06/2023   12:05 PM 10/15/2022    3:30 PM  CBC  WBC 4.0 - 10.5 K/uL 4.2  4.3  5.8   Hemoglobin 12.0 - 15.0 g/dL 16.6  06.3  01.6   Hematocrit 36.0 - 46.0 % 46.0  44.5  43.6   Platelets 150 - 400 K/uL 180  178  171       Latest Ref Rng & Units 09/12/2023    1:39 PM 03/06/2023   12:05 PM 10/15/2022    3:30 PM  CMP  Glucose 70 - 99 mg/dL 010  87  932   BUN 6 - 20 mg/dL 14  11  15    Creatinine 0.44 - 1.00 mg/dL 3.55  7.32  2.02   Sodium 135 - 145 mmol/L 137  140  139   Potassium 3.5 - 5.1 mmol/L 3.8  3.7  3.6   Chloride 98 - 111 mmol/L 104  106  104   CO2 22 - 32 mmol/L 25  26  28    Calcium 8.9 - 10.3 mg/dL 9.1  9.0  9.0   Total Protein 6.5 - 8.1 g/dL 7.4  7.5    Total Bilirubin 0.3 - 1.2 mg/dL 0.8  0.5    Alkaline Phos 38 - 126 U/L 121  143    AST 15 - 41 U/L 33  30    ALT 0 - 44 U/L 39  31       No results found for: "CEA1", "CEA" / No results found for: "CEA1", "CEA" No results found for: "PSA1" No results found for: "RKY706" No results found for: "CAN125"  No results found for: "TOTALPROTELP", "ALBUMINELP", "A1GS", "A2GS", "BETS", "BETA2SER", "GAMS", "MSPIKE", "SPEI" No results found for: "TIBC", "FERRITIN", "IRONPCTSAT" No results found for:  "LDH"   STUDIES:   MR Brain W Wo Contrast  Result Date: 10/02/2023 CLINICAL DATA:  Breast cancer, pressure/pain in the left side of the head. EXAM: MRI HEAD WITHOUT AND WITH CONTRAST TECHNIQUE: Multiplanar, multiecho pulse sequences of the brain and surrounding structures were obtained without and with intravenous contrast. CONTRAST:  7mL GADAVIST GADOBUTROL 1 MMOL/ML IV SOLN COMPARISON:  CT head 10/15/2022, 06/21/2021. FINDINGS: Brain: There is no acute intracranial hemorrhage, extra-axial fluid collection, or acute infarct. Parenchymal volume is normal. The ventricles are not enlarged. There is unchanged asymmetry of the lateral ventricles with the right larger than the left. Parenchymal signal is normal. The pituitary and suprasellar region are normal. There is no mass lesion or abnormal enhancement. There is no mass effect or midline shift. Vascular: Normal flow voids. Skull and upper cervical spine: There is a nonenhancing T1 hypointense lesion in the right parietal bone measuring 1.4 cm not definitely present on the CT head from 2022, suspicious for osseous metastatic disease. Sinuses/Orbits: The paranasal sinuses are clear. The globes and orbits are unremarkable. Other: The mastoid air cells and middle ear cavities are clear. IMPRESSION: 1. No evidence of intracranial metastatic disease. 2. 1.4 cm enhancing lesion in the right parietal bone suspicious for osseous metastatic disease. Electronically Signed   By: Lesia Hausen M.D.   On: 10/02/2023 14:14

## 2023-10-16 ENCOUNTER — Inpatient Hospital Stay: Payer: Medicare Other | Attending: Hematology | Admitting: Hematology

## 2023-10-16 VITALS — BP 154/111 | HR 88 | Temp 98.1°F | Resp 16 | Wt 175.2 lb

## 2023-10-16 DIAGNOSIS — Z90710 Acquired absence of both cervix and uterus: Secondary | ICD-10-CM | POA: Insufficient documentation

## 2023-10-16 DIAGNOSIS — E559 Vitamin D deficiency, unspecified: Secondary | ICD-10-CM

## 2023-10-16 DIAGNOSIS — Z8041 Family history of malignant neoplasm of ovary: Secondary | ICD-10-CM | POA: Insufficient documentation

## 2023-10-16 DIAGNOSIS — R110 Nausea: Secondary | ICD-10-CM | POA: Insufficient documentation

## 2023-10-16 DIAGNOSIS — Z9013 Acquired absence of bilateral breasts and nipples: Secondary | ICD-10-CM | POA: Insufficient documentation

## 2023-10-16 DIAGNOSIS — Z79811 Long term (current) use of aromatase inhibitors: Secondary | ICD-10-CM | POA: Insufficient documentation

## 2023-10-16 DIAGNOSIS — Z1721 Progesterone receptor positive status: Secondary | ICD-10-CM | POA: Diagnosis not present

## 2023-10-16 DIAGNOSIS — G629 Polyneuropathy, unspecified: Secondary | ICD-10-CM | POA: Insufficient documentation

## 2023-10-16 DIAGNOSIS — Z90722 Acquired absence of ovaries, bilateral: Secondary | ICD-10-CM | POA: Insufficient documentation

## 2023-10-16 DIAGNOSIS — C50912 Malignant neoplasm of unspecified site of left female breast: Secondary | ICD-10-CM | POA: Diagnosis not present

## 2023-10-16 DIAGNOSIS — Z170 Estrogen receptor positive status [ER+]: Secondary | ICD-10-CM | POA: Diagnosis not present

## 2023-10-16 DIAGNOSIS — C50411 Malignant neoplasm of upper-outer quadrant of right female breast: Secondary | ICD-10-CM | POA: Diagnosis not present

## 2023-10-16 DIAGNOSIS — C50911 Malignant neoplasm of unspecified site of right female breast: Secondary | ICD-10-CM | POA: Diagnosis present

## 2023-10-16 DIAGNOSIS — Z923 Personal history of irradiation: Secondary | ICD-10-CM | POA: Insufficient documentation

## 2023-10-16 DIAGNOSIS — M8580 Other specified disorders of bone density and structure, unspecified site: Secondary | ICD-10-CM | POA: Diagnosis not present

## 2023-10-16 DIAGNOSIS — C50412 Malignant neoplasm of upper-outer quadrant of left female breast: Secondary | ICD-10-CM

## 2023-10-16 DIAGNOSIS — Z803 Family history of malignant neoplasm of breast: Secondary | ICD-10-CM | POA: Diagnosis not present

## 2023-10-16 NOTE — Patient Instructions (Addendum)
[facility] at [facility] Discharge Instructions   You were seen and examined today by Dr. Ellin Saba.  He reviewed the images of your PET scan which did not show anything cancer-wise. The radiologist has not issued a final report yet. We will call you once the scan is read and let you know the final report.  Dr. Kirtland Bouchard would like you to continue the exemestane for an additional 5 years.   We will see you back in 1 year. We will repeat lab work prior to this visit.   Return as scheduled.    Thank you for choosing [facility] at [facility] to provide your oncology and hematology care.  To afford each patient quality time with our provider, please arrive at least 15 minutes before your scheduled appointment time.   If you have a lab appointment with the Cancer Center please come in thru the Main Entrance and check in at the main information desk.  You need to re-schedule your appointment should you arrive 10 or more minutes late.  We strive to give you quality time with our providers, and arriving late affects you and other patients whose appointments are after yours.  Also, if you no show three or more times for appointments you may be dismissed from the clinic at the providers discretion.     Again, thank you for choosing [facility].  Our hope is that these requests will decrease the amount of time that you wait before being seen by our physicians.       _____________________________________________________________  Should you have questions after your visit to [facility], please contact our office at [phone] and follow the prompts.  Our office hours are 8:00 a.m. and 4:30 p.m. Monday - Friday.  Please note that voicemails left after 4:00 p.m. may not be returned until the following business day.  We are closed weekends and major holidays.  You do have access to a nurse 24-7, just call the main number to the clinic  [phone] and do not press any options, hold on the line and a nurse will answer the phone.    For prescription refill requests, have your pharmacy contact our office and allow 72 hours.    Due to Covid, you will need to wear a mask upon entering the hospital. If you do not have a mask, a mask will be given to you at the Main Entrance upon arrival. For doctor visits, patients may have 1 support person age 51 or older with them. For treatment visits, patients can not have anyone with them due to social distancing guidelines and our immunocompromised population.

## 2023-11-18 ENCOUNTER — Other Ambulatory Visit: Payer: Self-pay | Admitting: Hematology

## 2023-11-18 DIAGNOSIS — Z170 Estrogen receptor positive status [ER+]: Secondary | ICD-10-CM

## 2023-11-18 DIAGNOSIS — Z853 Personal history of malignant neoplasm of breast: Secondary | ICD-10-CM

## 2023-11-21 ENCOUNTER — Other Ambulatory Visit: Payer: Self-pay

## 2023-11-27 NOTE — Progress Notes (Signed)
NEUROLOGY FOLLOW UP OFFICE NOTE  Tylah Gullatt [id number]  Assessment/Plan:   Migraine without aura, without status migrainosus, not intractable, improved  Memory deficits - likely pharmacologic or possibly B12 deficiency.  With normal brain imaging and her young age, neurodegenerative disease/primary neurologic disease not suspected. 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  Zofran or Compazine as needed Limit use of pain relievers to no more than 2 days out of week to prevent risk of rebound or medication-overuse headache. Keep headache diary Regarding memory deficits: Discontinue topiramate Check B12 and TSH Follow up with PCP regarding BP Follow up in 6 months.    Subjective:  Aimar Sieminski is a 52 year old female with HTN and history of breast cancer s/p mastectomy who follows up for migraines.  UPDATE: Restarted Botox.  Status post two rounds. Intensity:  moderate (previously severe) Duration:  less than 1 day with Nurtec/sumatriptan (previously 2 days) Frequency:  4-5 days a month (previously 16-24 days a month)  She notes increased short term memory problems.  Started after starting chemotherapy.  Noted feeling sensation of "dripping" over her head.  Recently had MRI of brain with and without contrast on 09/26/2023, personally reviewed, which revealed no acute intracranial abnormality but did reveal a 1.4 cm enhancing lesion in the right parietal bone.  Follow up PET scan on 10/11/2023 showed no hypermetabolic activity to suggest metastasis.     Notes short term memory (( Current NSAIDS/analgesics:  Fioricet Current triptans:  sumatriptan 100mg  Current ergotamine:  none Current anti-emetic:  ondansetron, Compazine 10mg  Current muscle relaxants:  none Current Antihypertensive medications: metoprolol succinate ER, lisinopril Current  Antidepressant medications:  venlafaxine ER 150mg  daily, sertraline 50mg  daily Current Anticonvulsant medications:  topiramate 25mg  twice daily, gabapentin 300mg  BID Current anti-CGRP:  Ajovy, Nurtec PRN  Current Vitamins/Herbal/Supplements:  E Current Antihistamines/Decongestants:  Flonase, Zyrtec Other therapy:  BOTOX Birth control:  none Other medications:  quetiapine 50mg  QHS, clonazepam QHS PRN   Caffeine:  no Diet:  water.  No soda.  Does not skip meals Exercise:  walksing Depression:  off and on; Anxiety:  yes Sleep hygiene:  wakes up frequently   Onset:  Occasional migraines since childhood.  Started getting worse n 2019 during chemotherapy for breast cancer.  Continued after she finished chemotherapy.  She was doing well on Botox.  However, her prior neurologist retired and she hasn't had Botox since November.  Migraines have worsened.   Location:  diffuse, bandlike Quality:  squeezing, pounding Intensity:  severe Aura:  absent Prodrome:  absent Associated symptoms:  Nausea, photophobia, phonophobia, sometimes sees flashing light.  She denies associated unilateral numbness or weakness. Duration:  1 day when treated and on Botox.  Hasn't had Botox since November.  No lasts 2 days. Frequency:  once a week when on Botox.  Prior neurologist retired.  Hasn't had Botox since November.  Now occurs 2 to 3 times a week (16-24 days a month) Triggers:  unknown Relieving factors:  rest Activity:  aggravates  CT Head on 10/15/2022 personally reviewed was normal.   Past NSAIDS/analgesics:  ibuprofen, Tylenol Past abortive triptans:  none Past abortive ergotamine:  none Past muscle relaxants:  none Past anti-emetic:  none Past antihypertensive medications:  none Past antidepressant medications:  none Past anticonvulsant medications:  none Past anti-CGRP:  none Past vitamins/Herbal/Supplements:  none Past antihistamines/decongestants:  none Other past therapies:  none  Family  history of headache:  no  PAST MEDICAL HISTORY: Past Medical History:  Diagnosis Date   Anxiety    Cancer (HCC)    Depression    High cholesterol    Hypertension     MEDICATIONS: Current Outpatient Medications on File Prior to Visit  Medication Sig Dispense Refill   acetaminophen (TYLENOL) 325 MG tablet Take 650 mg by mouth every 6 (six) hours as needed for mild pain, moderate pain, fever or headache.     AJOVY 225 MG/1.5ML SOAJ Inject 225 mg into the skin every 30 (thirty) days. 1.68 mL 1   botulinum toxin Type A (BOTOX) 200 units injection Inject 155 units IM into multiple site in the face,neck and head once every 90 days 1 each 4   Butalbital-Acetaminophen 50-300 MG CAPS Take 1 capsule by mouth every 4 (four) hours as needed for headache.     cetirizine (ZYRTEC) 10 MG tablet Take 10 mg by mouth daily.     cholecalciferol (VITAMIN D3) 25 MCG (1000 UNIT) tablet Take 1,000 Units by mouth daily.     clonazePAM (KLONOPIN) 0.5 MG tablet Take 0.5 mg by mouth 2 (two) times daily as needed for anxiety.     exemestane (AROMASIN) 25 MG tablet Take 1 tablet by mouth once daily 30 tablet 0   fluticasone (FLONASE) 50 MCG/ACT nasal spray Place into both nostrils daily.     gabapentin (NEURONTIN) 300 MG capsule Take 300 mg by mouth 2 (two) times daily.     lisinopril (ZESTRIL) 10 MG tablet Take 10 mg by mouth 3 (three) times daily.     meclizine (ANTIVERT) 12.5 MG tablet Take 12.5 mg by mouth daily as needed for dizziness.     metoprolol succinate (TOPROL-XL) 25 MG 24 hr tablet 1 tablet     Multiple Vitamin (MULTIVITAMIN) capsule Take 1 capsule by mouth daily.     NURTEC 75 MG TBDP Take by mouth.     ondansetron (ZOFRAN-ODT) 8 MG disintegrating tablet Take 8 mg by mouth every 8 (eight) hours as needed for nausea or vomiting.     prochlorperazine (COMPAZINE) 10 MG tablet Take 10 mg by mouth every 6 (six) hours as needed for nausea or vomiting.     QUEtiapine (SEROQUEL) 50 MG tablet Take 50 mg by  mouth daily as needed (mood).     rosuvastatin (CRESTOR) 5 MG tablet Take 5 mg by mouth daily.     sertraline (ZOLOFT) 50 MG tablet Take 50 mg by mouth daily.     SUMAtriptan (IMITREX) 100 MG tablet Take 1 tablet (100 mg total) by mouth as needed for migraine. May repeat in 2 hours if headache persists or recurs.  Maximum 2 tablets in 24 hours. 10 tablet 5   topiramate (TOPAMAX) 25 MG tablet Take 1 tablet (25 mg total) by mouth 2 (two) times daily. 60 tablet 1   venlafaxine XR (EFFEXOR-XR) 150 MG 24 hr capsule Take 150 mg by mouth daily.     vitamin E 180 MG (400 UNITS) capsule Take 400 Units by mouth daily.     No current facility-administered medications on file prior to visit.    ALLERGIES: Allergies  Allergen Reactions   Chlorhexidine Itching    Other reaction(s): Unknown   Hibiclens [Chlorhexidine Gluconate]    Povidone-Iodine     Other reaction(s): itching, Unknown    FAMILY HISTORY: Family History  Problem Relation Age of Onset   Stroke Father    Breast cancer Maternal Aunt  Ovarian cancer Maternal Aunt       Objective:  Blood pressure (!) 149/92, pulse 96, height 5\' 4"  (1.626 m), weight 176 lb 7.2 oz (80 kg). General: No acute distress.  Patient appears well-groomed.   Head:  Normocephalic/atraumatic Eyes:  Fundi examined but not visualized Neck: supple, no paraspinal tenderness, full range of motion Heart:  Regular rate and rhythm Lungs:  Clear to auscultation bilaterally Back: No paraspinal tenderness Neurological Exam: alert and oriented.  Speech fluent and not dysarthric, language intact.  CN II-XII intact. Bulk and tone normal, muscle strength 5/5 throughout.  Sensation to light touch intact.  Deep tendon reflexes 2+ throughout, toes downgoing.  Finger to nose testing intact.  Gait normal, Romberg with sway.   Shon Millet, DO  CC: Colvin Caroli, FNP

## 2023-11-30 ENCOUNTER — Ambulatory Visit (INDEPENDENT_AMBULATORY_CARE_PROVIDER_SITE_OTHER): Payer: Medicare Other | Admitting: Neurology

## 2023-11-30 ENCOUNTER — Encounter: Payer: Self-pay | Admitting: Neurology

## 2023-11-30 ENCOUNTER — Other Ambulatory Visit: Payer: Medicare Other

## 2023-11-30 VITALS — BP 149/92 | HR 96 | Ht 64.0 in | Wt 176.4 lb

## 2023-11-30 DIAGNOSIS — G43009 Migraine without aura, not intractable, without status migrainosus: Secondary | ICD-10-CM | POA: Diagnosis not present

## 2023-11-30 DIAGNOSIS — R413 Other amnesia: Secondary | ICD-10-CM | POA: Diagnosis not present

## 2023-11-30 DIAGNOSIS — G620 Drug-induced polyneuropathy: Secondary | ICD-10-CM

## 2023-11-30 DIAGNOSIS — T451X5A Adverse effect of antineoplastic and immunosuppressive drugs, initial encounter: Secondary | ICD-10-CM | POA: Diagnosis not present

## 2023-11-30 MED ORDER — AJOVY 225 MG/1.5ML ~~LOC~~ SOAJ: 225.0000 mg | SUBCUTANEOUS | 11 refills | Status: DC

## 2023-11-30 MED ORDER — NURTEC 75 MG PO TBDP: 1.0000 | ORAL_TABLET | Freq: Every day | ORAL | 5 refills | Status: DC | PRN

## 2023-11-30 NOTE — Patient Instructions (Signed)
Continue BOTOX every 3 months and Ajovy every month STOP TOPIRAMATE Use sumatriptan or Nurtec as needed for migraine attacks Zofran or Compazine for nausea Check B12 and TSH levels Follow up 6 months.

## 2023-12-01 LAB — TSH: TSH: 1.93 m[IU]/L

## 2023-12-01 LAB — VITAMIN B12: Vitamin B-12: 400 pg/mL (ref 200–1100)

## 2023-12-03 ENCOUNTER — Encounter (HOSPITAL_COMMUNITY): Payer: Self-pay

## 2023-12-03 ENCOUNTER — Emergency Department (HOSPITAL_COMMUNITY)
Admission: EM | Admit: 2023-12-03 | Discharge: 2023-12-04 | Disposition: A | Discharge status: Home / Self Care | Payer: Medicare Other | Attending: Emergency Medicine | Admitting: Emergency Medicine

## 2023-12-03 ENCOUNTER — Emergency Department (HOSPITAL_COMMUNITY): Payer: Medicare Other

## 2023-12-03 ENCOUNTER — Other Ambulatory Visit: Payer: Self-pay

## 2023-12-03 DIAGNOSIS — I10 Essential (primary) hypertension: Secondary | ICD-10-CM | POA: Insufficient documentation

## 2023-12-03 DIAGNOSIS — R079 Chest pain, unspecified: Secondary | ICD-10-CM

## 2023-12-03 DIAGNOSIS — R0789 Other chest pain: Secondary | ICD-10-CM | POA: Diagnosis present

## 2023-12-03 DIAGNOSIS — R519 Headache, unspecified: Secondary | ICD-10-CM | POA: Diagnosis not present

## 2023-12-03 DIAGNOSIS — Z79899 Other long term (current) drug therapy: Secondary | ICD-10-CM | POA: Insufficient documentation

## 2023-12-03 DIAGNOSIS — Z853 Personal history of malignant neoplasm of breast: Secondary | ICD-10-CM | POA: Insufficient documentation

## 2023-12-03 LAB — CBC
HCT: 43 % (ref 36.0–46.0)
Hemoglobin: 14.7 g/dL (ref 12.0–15.0)
MCH: 32.5 pg (ref 26.0–34.0)
MCHC: 34.2 g/dL (ref 30.0–36.0)
MCV: 95.1 fL (ref 80.0–100.0)
Platelets: 142 10*3/uL — ABNORMAL LOW (ref 150–400)
RBC: 4.52 MIL/uL (ref 3.87–5.11)
RDW: 12.4 % (ref 11.5–15.5)
WBC: 8 10*3/uL (ref 4.0–10.5)
nRBC: 0 % (ref 0.0–0.2)

## 2023-12-03 LAB — BASIC METABOLIC PANEL
Anion gap: 9 (ref 5–15)
BUN: 13 mg/dL (ref 6–20)
CO2: 25 mmol/L (ref 22–32)
Calcium: 9.6 mg/dL (ref 8.9–10.3)
Chloride: 103 mmol/L (ref 98–111)
Creatinine, Ser: 0.74 mg/dL (ref 0.44–1.00)
GFR, Estimated: >60 mL/min (ref >60)
Glucose, Bld: 160 mg/dL — ABNORMAL HIGH (ref 70–99)
Potassium: 4 mmol/L (ref 3.5–5.1)
Sodium: 137 mmol/L (ref 135–145)

## 2023-12-03 LAB — TROPONIN I (HIGH SENSITIVITY)
Troponin I (High Sensitivity): 4 ng/L (ref <18)
Troponin I (High Sensitivity): 4 ng/L (ref <18)

## 2023-12-03 MED ORDER — KETOROLAC TROMETHAMINE 15 MG/ML IJ SOLN
15.0000 mg | Freq: Once | INTRAMUSCULAR | Status: AC
  Administered 2023-12-04: 15 mg via INTRAVENOUS
  Filled 2023-12-03: qty 1

## 2023-12-03 MED ORDER — PROCHLORPERAZINE EDISYLATE 10 MG/2ML IJ SOLN
10.0000 mg | Freq: Once | INTRAMUSCULAR | Status: AC
  Administered 2023-12-04: 10 mg via INTRAVENOUS
  Filled 2023-12-03: qty 2

## 2023-12-03 MED ORDER — LACTATED RINGERS IV BOLUS
1000.0000 mL | Freq: Once | INTRAVENOUS | Status: AC
  Administered 2023-12-04: 1000 mL via INTRAVENOUS

## 2023-12-03 MED ORDER — DIPHENHYDRAMINE HCL 50 MG/ML IJ SOLN
12.5000 mg | Freq: Once | INTRAMUSCULAR | Status: AC
  Administered 2023-12-04: 12.5 mg via INTRAVENOUS
  Filled 2023-12-03: qty 1

## 2023-12-03 NOTE — ED Triage Notes (Addendum)
Pt states she  has hx of HTN, started having chest pain about 30 minutes ago & her PCP told her to come to ER.  Pt reports nausea & 2 episodes of vomiting prior to chest pain starting. Reports SOB when she tries to walk.  Pt states movement can make her chest pain increase. Pt winced when placing EKG leads on her chest.

## 2023-12-03 NOTE — ED Provider Notes (Signed)
[facility] Provider Note   CSN: [id number] Arrival date & time: 12/03/23  2005     History  Chief Complaint  Patient presents with   Chest Pain    Sharon Phillips is a 52 y.o. female.   Chest Pain Associated symptoms: headache   Patient presents for chest pain.  Medical history includes HTN, HLD, anxiety, depression, prior breast cancer s/p bilateral mastectomy and subsequent cosmetic surgery.  Surgeries occurred approximately 3 years ago.  Patient has been told that she is now cancer free.  Over the past several months, she will have intermittent sharp left-sided chest pain.  She feels that it is worsened when her blood pressure elevates.  Episode of left-sided chest pain occurred today at around 4 PM.  Symptoms were severe.  Chest pain has since subsided.  It is currently 5/10 in severity.  She denies pleurisy.  She does endorse current migraine headache, which she has a history of.     Home Medications Prior to Admission medications   Medication Sig Start Date End Date Taking? Authorizing Provider  acetaminophen (TYLENOL) 325 MG tablet Take 650 mg by mouth every 6 (six) hours as needed for mild pain, moderate pain, fever or headache.    [provider]  AJOVY 225 MG/1.5ML SOAJ Inject 225 mg into the skin every 30 (thirty) days. 11/30/23   Drema Dallas, DO  Ascorbic Acid (VITAMIN C) 100 MG tablet Take 100 mg by mouth daily.    [provider]  botulinum toxin Type A (BOTOX) 200 units injection Inject 155 units IM into multiple site in the face,neck and head once every 90 days 06/14/23   Drema Dallas, DO  cetirizine (ZYRTEC) 10 MG tablet Take 10 mg by mouth daily. 04/13/21   [provider]  cholecalciferol (VITAMIN D3) 25 MCG (1000 UNIT) tablet Take 1,000 Units by mouth daily.    [provider]  clonazePAM (KLONOPIN) 0.5 MG tablet Take 0.5 mg by mouth 2 (two) times daily as needed for anxiety.     [provider]  exemestane (AROMASIN) 25 MG tablet Take 1 tablet by mouth once daily 11/19/23   Doreatha Massed, MD  fluticasone [facility]) 50 MCG/ACT nasal spray Place into both nostrils daily.    [provider]  gabapentin (NEURONTIN) 300 MG capsule Take 300 mg by mouth 2 (two) times daily. 04/13/21   [provider]  lisinopril (ZESTRIL) 10 MG tablet Take 10 mg by mouth 3 (three) times daily. 06/07/21   [provider]  meclizine (ANTIVERT) 12.5 MG tablet Take 12.5 mg by mouth daily as needed for dizziness.    [provider]  metoprolol succinate (TOPROL-XL) 25 MG 24 hr tablet 1 tablet 04/13/21   [provider]  Multiple Vitamin (MULTIVITAMIN) capsule Take 1 capsule by mouth daily.    [provider]  NURTEC 75 MG TBDP Take 1 tablet (75 mg total) by mouth daily as needed. 11/30/23   Everlena Cooper, Adam R, DO  ondansetron (ZOFRAN-ODT) 8 MG disintegrating tablet Take 8 mg by mouth every 8 (eight) hours as needed for nausea or vomiting. 05/04/21   [provider]  prochlorperazine (COMPAZINE) 10 MG tablet Take 10 mg by mouth every 6 (six) hours as needed for nausea or vomiting.    [provider]  rosuvastatin (CRESTOR) 5 MG tablet Take 5 mg by mouth daily. 01/09/22   [provider]  sertraline (ZOLOFT) 50 MG tablet Take 50 mg  by mouth daily.    [provider]  SUMAtriptan (IMITREX) 100 MG tablet Take 1 tablet (100 mg total) by mouth as needed for migraine. May repeat in 2 hours if headache persists or recurs.  Maximum 2 tablets in 24 hours. 09/21/23   Drema Dallas, DO  venlafaxine XR (EFFEXOR-XR) 150 MG 24 hr capsule Take 150 mg by mouth daily. 01/09/22   [provider]  vitamin E 180 MG (400 UNITS) capsule Take 400 Units by mouth daily.    [provider]      Allergies    Chlorhexidine, Hibiclens [chlorhexidine gluconate], and Povidone-iodine    Review of Systems   Review of Systems   Cardiovascular:  Positive for chest pain.  Neurological:  Positive for headaches.  All other systems reviewed and are negative.   Physical Exam Updated Vital Signs BP (!) 166/123   Pulse 93   Temp 97.9 F (36.6 C) (Oral)   Resp (!) 25   Ht 5\' 4"  (1.626 m)   Wt 79.8 kg   SpO2 100%   BMI 30.21 kg/m  Physical Exam Vitals and nursing note reviewed.  Constitutional:      General: She is not in acute distress.    Appearance: She is well-developed. She is not ill-appearing, toxic-appearing or diaphoretic.  HENT:     Head: Normocephalic and atraumatic.  Eyes:     Conjunctiva/sclera: Conjunctivae normal.  Cardiovascular:     Rate and Rhythm: Normal rate and regular rhythm.     Heart sounds: No murmur heard. Pulmonary:     Effort: Pulmonary effort is normal. No tachypnea.  Chest:     Chest wall: Tenderness present.  Abdominal:     Palpations: Abdomen is soft.     Tenderness: There is no abdominal tenderness.  Musculoskeletal:        General: No swelling. Normal range of motion.     Cervical back: Normal range of motion and neck supple.  Skin:    General: Skin is warm and dry.     Coloration: Skin is not cyanotic or pale.  Neurological:     General: No focal deficit present.     Mental Status: She is alert and oriented to person, place, and time.  Psychiatric:        Mood and Affect: Mood normal.        Behavior: Behavior normal.     ED Results / Procedures / Treatments   Labs (all labs ordered are listed, but only abnormal results are displayed) Labs Reviewed  BASIC METABOLIC PANEL - Abnormal; Notable for the following components:      Result Value   Glucose, Bld 160 (*)    All other components within normal limits  CBC - Abnormal; Notable for the following components:   Platelets 142 (*)    All other components within normal limits  TROPONIN I (HIGH SENSITIVITY)  TROPONIN I (HIGH SENSITIVITY)    EKG None  Radiology DG Chest 2 View Result Date:  12/03/2023 CLINICAL DATA:  Chest pain and tightness EXAM: CHEST - 2 VIEW COMPARISON:  06/27/2021 FINDINGS: Cardiac shadow is within normal limits. Lungs are well aerated bilaterally. Focal infiltrate or effusion is seen. No bony abnormality is noted. IMPRESSION: No active cardiopulmonary disease. Electronically Signed   By: Alcide Clever M.D.   On: 12/03/2023 22:35    Procedures Procedures    Medications Ordered in ED Medications  prochlorperazine (COMPAZINE) injection 10 mg (has no administration in time range)  diphenhydrAMINE (BENADRYL) injection 12.5 mg (has no administration in time range)  ketorolac (TORADOL) 15 MG/ML injection 15 mg (has no administration in time range)  lactated ringers bolus 1,000 mL (has no administration in time range)    ED Course/ Medical Decision Making/ A&P                                 Medical Decision Making Amount and/or Complexity of Data Reviewed Labs: ordered. Radiology: ordered.  Risk Prescription drug management.   This patient presents to the ED for concern of chest pain, this involves an extensive number of treatment options, and is a complaint that carries with it a high risk of complications and morbidity.  The differential diagnosis includes ACS, PE, MSK etiology, recurrence of cancer   Co morbidities that complicate the patient evaluation  HTN, HLD, anxiety, depression, prior breast cancer   Additional history obtained:  Additional history obtained from N/A External records from outside source obtained and reviewed including EMR   Lab Tests:  I Ordered, and personally interpreted labs.  The pertinent results include: Normal hemoglobin, no leukocytosis, normal kidney function, normal electrolytes, normal troponin x 2   Imaging Studies ordered:  I ordered imaging studies including chest x-ray, CT head, CTA chest I independently visualized and interpreted imaging which showed no acute findings on x-ray; CT imaging pending  at time of signout. I agree with the radiologist interpretation   Cardiac Monitoring: / EKG:  The patient was maintained on a cardiac monitor.  I personally viewed and interpreted the cardiac monitored which showed an underlying rhythm of: Sinus rhythm  Problem List / ED Course / Critical interventions / Medication management  Patient presents for intermittent left-sided chest pain over the past several months, worsened today.  Vital signs on arrival are notable for tachypnea and hypertension.  She did undergo workup prior to being bedded in the ED.  Workup was reassuring with normal lab findings, including troponins.  On assessment, patient is overall well-appearing.  She does have reproducible chest pain on mid aspect of left side of chest.  There are no overlying skin changes.  Current breathing is unlabored and SpO2 is normal on room air.  She does have a history of breast cancer which raises a concern for recurrence and/or PE.  CTA was ordered.  Patient also currently experiencing migraine headache.  Headache cocktail was ordered.  Patient will require follow-up on imaging results and reassessment.  Care of patient was signed out to oncoming ED provider. I ordered medication including IV fluids, Compazine, Toradol, Benadryl for headache and chest pain Reevaluation of the patient after these medicines showed that the patient improved I have reviewed the patients home medicines and have made adjustments as needed   Social Determinants of Health:  Has access to outpatient care         Final Clinical Impression(s) / ED Diagnoses Final diagnoses:  Chest pain, unspecified type  Bad headache    Rx / DC Orders ED Discharge Orders          Ordered    Ambulatory referral to Cardiology       Comments: If you have not heard from the Cardiology office within the next 72 hours please call [phone].   12/03/23 2300              Gloris Manchester, MD 12/03/23 2311

## 2023-12-04 ENCOUNTER — Emergency Department (HOSPITAL_COMMUNITY): Payer: Medicare Other

## 2023-12-04 DIAGNOSIS — R0789 Other chest pain: Secondary | ICD-10-CM | POA: Diagnosis not present

## 2023-12-04 MED ORDER — IOHEXOL 350 MG/ML SOLN
75.0000 mL | Freq: Once | INTRAVENOUS | Status: AC | PRN
  Administered 2023-12-04: 75 mL via INTRAVENOUS

## 2023-12-04 NOTE — ED Provider Notes (Signed)
 I assumed care in signout from Dr. Melvenia to follow-up on imaging and reassess  CT head and chest are both negative. On my reassessment, patient is resting comfortably in no acute distress.  She denies any headache at this time, and no chest pain at this time  Overall patient feels comfortable for discharge home, but she is concerned about her elevated blood pressure readings recently. This may be triggering her headaches. Advised to keep daily logs of her BP readings and to contact her PCP as she may need medication adjustment Overall no signs of hypertensive emergency at this time.  Patient safe for discharge   Midge Golas, MD 12/04/23 [phone]

## 2023-12-06 ENCOUNTER — Telehealth: Payer: Self-pay

## 2023-12-06 NOTE — Telephone Encounter (Signed)
PA needed for Nurtec 75 mg.  

## 2023-12-07 ENCOUNTER — Telehealth: Payer: Self-pay

## 2023-12-07 NOTE — Telephone Encounter (Signed)
 PA request has been Submitted. New Encounter created for follow up. For additional info see Pharmacy Prior Auth telephone encounter from 01/03.

## 2023-12-07 NOTE — Telephone Encounter (Signed)
 Pharmacy Patient Advocate Encounter  Received notification from [employer] that Prior Authorization for Nurtec 75MG  dispersible tablets  has been APPROVED from 12/07/2023 to 12/05/2024   PA #/Case ID/Reference #: Sharon Phillips

## 2023-12-13 ENCOUNTER — Other Ambulatory Visit (HOSPITAL_COMMUNITY): Payer: Self-pay

## 2023-12-13 ENCOUNTER — Other Ambulatory Visit (HOSPITAL_COMMUNITY): Payer: Self-pay | Admitting: Pharmacy Technician

## 2023-12-13 NOTE — Progress Notes (Signed)
 Specialty Pharmacy Refill Coordination Note  Sharon Phillips is a 53 y.o. female contacted today regarding refills of specialty medication(s) OnabotulinumtoxinA  (Botox )   Patient requested Courier to Provider Office   Delivery date: 12/20/23   Verified address: [facility] [address] [place] [address] [place], [place]   Medication will be filled on 12/19/23.

## 2023-12-19 ENCOUNTER — Other Ambulatory Visit: Payer: Self-pay

## 2023-12-24 ENCOUNTER — Other Ambulatory Visit: Payer: Self-pay | Admitting: Hematology

## 2023-12-24 DIAGNOSIS — Z170 Estrogen receptor positive status [ER+]: Secondary | ICD-10-CM

## 2023-12-24 DIAGNOSIS — Z853 Personal history of malignant neoplasm of breast: Secondary | ICD-10-CM

## 2023-12-27 ENCOUNTER — Ambulatory Visit (INDEPENDENT_AMBULATORY_CARE_PROVIDER_SITE_OTHER): Payer: Medicare Other | Admitting: Neurology

## 2023-12-27 DIAGNOSIS — G43709 Chronic migraine without aura, not intractable, without status migrainosus: Secondary | ICD-10-CM | POA: Diagnosis not present

## 2023-12-27 MED ORDER — ONABOTULINUMTOXINA 100 UNITS IJ SOLR
200.0000 [IU] | Freq: Once | INTRAMUSCULAR | Status: AC
  Administered 2023-12-27: 155 [IU] via INTRAMUSCULAR

## 2023-12-27 NOTE — Progress Notes (Signed)
[facility]  ° °Procedure Note Botox ° °Attending: Dr.   ° °Preoperative Diagnosis(es): Chronic migraine ° °Consent obtained from: The patient °Benefits discussed included, but were not limited to decreased muscle tightness, increased joint range of motion, and decreased pain.  Risk discussed included, but were not limited pain and discomfort, bleeding, bruising, excessive weakness, venous thrombosis, muscle atrophy and dysphagia.  Anticipated outcomes of the procedure as well as he risks and benefits of the alternatives to the procedure, and the roles and tasks of the personnel to be involved, were discussed with the patient, and the patient consents to the procedure and agrees to proceed. A copy of the patient medication guide was given to the patient which explains the blackbox warning. ° °Patients identity and treatment sites confirmed Yes.  . ° °Details of Procedure: °Skin was cleaned with alcohol. Prior to injection, the needle plunger was aspirated to make sure the needle was not within a blood vessel.  There was no blood retrieved on aspiration.   ° °Following is a summary of the muscles injected  And the amount of Botulinum toxin used: ° °Dilution °200 units of Botox was reconstituted with 4 ml of preservative free normal saline. °Time of reconstitution: At the time of the office visit (<30 minutes prior to injection)  ° °Injections  °155 total units of Botox was injected with a 30 gauge needle. ° °Injection Sites: °L occipitalis: 15 units- 3 sites  °R occiptalis: 15 units- 3 sites ° °L upper trapezius: 15 units- 3 sites °R upper trapezius: 15 units- 3 sits          °L paraspinal: 10 units- 2 sites °R paraspinal: 10 units- 2 sites ° °Face °L frontalis(2 injection sites):10 units   °R frontalis(2 injection sites):10 units         °L corrugator: 5 units   °R corrugator: 5 units           °Procerus: 5 units   °L temporalis: 20 units °R temporalis: 20 units  ° °Agent:  °200 units of botulinum Type  A (Onobotulinum Toxin type A) was reconstituted with 4 ml of preservative free normal saline.  °Time of reconstitution: At the time of the office visit (<30 minutes prior to injection)  ° ° ° Total injected (Units):  155 ° Total wasted (Units):  45 ° °Patient tolerated procedure well without complications.   °Reinjection is anticipated in 3 months. ° ° °

## 2024-01-08 ENCOUNTER — Encounter: Payer: Self-pay | Admitting: Internal Medicine

## 2024-01-08 ENCOUNTER — Ambulatory Visit: Payer: Medicare Other | Attending: Internal Medicine | Admitting: Internal Medicine

## 2024-01-08 VITALS — BP 122/84 | HR 103 | Ht 61.0 in | Wt 170.8 lb

## 2024-01-08 DIAGNOSIS — R079 Chest pain, unspecified: Secondary | ICD-10-CM

## 2024-01-08 DIAGNOSIS — R0789 Other chest pain: Secondary | ICD-10-CM | POA: Diagnosis present

## 2024-01-08 NOTE — Progress Notes (Signed)
 Cardiology Office Note  Date: 01/08/2024   ID: Sharon Phillips, DOB [birth year], MRN [id number]  PCP:  Sharon Silvio BROCKS, FNP  Cardiologist:  Sharon SHAUNNA Maywood, MD Electrophysiologist:  None   History of Present Illness: Sharon Phillips is a 53 y.o. female known to have HTN, history of bilateral mastectomy with breast implants in place now, was referred to cardiology clinic post ER visit for HTN management.  Patient had ER visit recently on 12/03/2023 for chest pain.  BP was noted to be around 166/123 mmHg. Hs troponins within normal limits and EKG normal.  She went to see her PCP who changed her antihypertensive medications, currently on lisinopril  20 mg twice daily and metoprolol  succinate 25 mg once daily after which her blood pressures are controlled as outpatient she does not check her blood pressures at home.  Last checked was around 1 month ago.  She has symptoms of chest pain/sharp chest pains lasting for about 10 minutes, occurring with exertional physical activity and resolving with rest.  It occurs 2 times per week.  Ongoing for quite a while.  No other symptoms of DOE, orthopnea, PND, dizziness, syncope, leg swelling.  She has occasional dizziness for which she takes the medication prescribed by her PCP after which her dizziness completely resolves.  Past Medical History:  Diagnosis Date   Anxiety    Cancer (HCC)    Depression    High cholesterol    Hypertension     Past Surgical History:  Procedure Laterality Date   ABDOMINAL HYSTERECTOMY     CENTRAL VENOUS CATHETER INSERTION Right 06/27/2021   Procedure: INSERTION CENTRAL LINE ADULT, tunneled;  Surgeon: Kallie Manuelita BROCKS, MD;  Location: [facility];  Service: General;  Laterality: Right;   MASTECTOMY     bilateral    Current Outpatient Medications  Medication Sig Dispense Refill   acetaminophen  (TYLENOL ) 325 MG tablet Take 650 mg by mouth every 6 (six) hours as needed for mild pain, moderate pain, fever or headache.     AJOVY   225 MG/1.5ML SOAJ Inject 225 mg into the skin every 30 (thirty) days. 1.68 mL 11   Ascorbic Acid (VITAMIN C) 100 MG tablet Take 100 mg by mouth daily.     botulinum toxin Type A  (BOTOX ) 200 units injection Inject 155 units IM into multiple site in the face,neck and head once every 90 days 1 each 4   cetirizine (ZYRTEC) 10 MG tablet Take 10 mg by mouth daily.     cholecalciferol (VITAMIN D3) 25 MCG (1000 UNIT) tablet Take 1,000 Units by mouth daily.     clonazePAM (KLONOPIN) 0.5 MG tablet Take 0.5 mg by mouth 2 (two) times daily as needed for anxiety.     exemestane  (AROMASIN ) 25 MG tablet Take 1 tablet by mouth once daily 30 tablet 5   fluticasone (FLONASE) 50 MCG/ACT nasal spray Place into both nostrils daily.     gabapentin  (NEURONTIN ) 300 MG capsule Take 300 mg by mouth 2 (two) times daily.     lisinopril  (ZESTRIL ) 20 MG tablet Take 20 mg by mouth 2 (two) times daily.     meclizine  (ANTIVERT ) 12.5 MG tablet Take 12.5 mg by mouth daily as needed for dizziness.     metoprolol  succinate (TOPROL -XL) 25 MG 24 hr tablet 1 tablet     Multiple Vitamin (MULTIVITAMIN) capsule Take 1 capsule by mouth daily.     NURTEC 75 MG TBDP Take 1 tablet (75 mg total) by mouth daily as needed. 8 tablet  5   ondansetron  (ZOFRAN -ODT) 8 MG disintegrating tablet Take 8 mg by mouth every 8 (eight) hours as needed for nausea or vomiting.     prochlorperazine  (COMPAZINE ) 10 MG tablet Take 10 mg by mouth every 6 (six) hours as needed for nausea or vomiting.     rosuvastatin  (CRESTOR ) 5 MG tablet Take 5 mg by mouth daily.     sertraline  (ZOLOFT ) 50 MG tablet Take 50 mg by mouth daily.     SUMAtriptan  (IMITREX ) 100 MG tablet Take 1 tablet (100 mg total) by mouth as needed for migraine. May repeat in 2 hours if headache persists or recurs.  Maximum 2 tablets in 24 hours. 10 tablet 5   venlafaxine  XR (EFFEXOR -XR) 150 MG 24 hr capsule Take 150 mg by mouth daily.     vitamin E 180 MG (400 UNITS) capsule Take 400 Units by mouth  daily.     No current facility-administered medications for this visit.   Allergies:  Chlorhexidine, Hibiclens [chlorhexidine gluconate], and Povidone-iodine   Social History: The patient  reports that she has never smoked. She has never used smokeless tobacco. She reports that she does not drink alcohol and does not use drugs.   Family History: The patient's family history includes Breast cancer in her maternal aunt; Ovarian cancer in her maternal aunt; Stroke in her father.   ROS:  Please see the history of present illness. Otherwise, complete review of systems is positive for none  All other systems are reviewed and negative.   Physical Exam: VS:  BP 122/84 (BP Location: Right Leg, Patient Position: Sitting, Cuff Size: Large)   Pulse (!) 103   Ht 5' 1 (1.549 m)   Wt 170 lb 12.8 oz (77.5 kg)   SpO2 94%   BMI 32.27 kg/m , BMI Body mass index is 32.27 kg/m.  Wt Readings from Last 3 Encounters:  01/08/24 170 lb 12.8 oz (77.5 kg)  12/03/23 176 lb (79.8 kg)  11/30/23 176 lb 7.2 oz (80 kg)    General: Patient appears comfortable at rest. HEENT: Conjunctiva and lids normal, oropharynx clear with moist mucosa. Neck: Supple, no elevated JVP or carotid bruits, no thyromegaly. Lungs: Clear to auscultation, nonlabored breathing at rest. Cardiac: Regular rate and rhythm, no S3 or significant systolic murmur, no pericardial rub. Abdomen: Soft, nontender, no hepatomegaly, bowel sounds present, no guarding or rebound. Extremities: No pitting edema, distal pulses 2+. Skin: Warm and dry. Musculoskeletal: No kyphosis. Neuropsychiatric: Alert and oriented x3, affect grossly appropriate.  Recent Labwork: 09/12/2023: ALT 39; AST 33 11/30/2023: TSH 1.93 12/03/2023: BUN 13; Creatinine, Ser 0.74; Hemoglobin 14.7; Platelets 142; Potassium 4.0; Sodium 137  No results found for: CHOL, TRIG, HDL, CHOLHDL, VLDL, LDLCALC, LDLDIRECT   Assessment and Plan:   Atypical chest pain HTN,  controlled   -Ongoing chest pains for quite a while, occurring 2 times per week, exertional and resolving with rest.  Lasting for about 10 minutes.  Reproduced on palpation.  Likely secondary to HTN.  Does not check her blood pressures at home.  Strongly encouraged her to check BP at home and keep a log.  Will see her back in 3 months to review her home BP trend and perform CT cardiac.  For now, obtain 2D echocardiogram.  Not a candidate for stress testing due to breast implants in place and concern for false positive stress test.      Medication Adjustments/Labs and Tests Ordered: Current medicines are reviewed at length with the patient today.  Concerns regarding medicines are outlined above.    Disposition:  Follow up  3 months  Signed Mozell Haber Priya Keenan Dimitrov, MD, 01/08/2024 3:35 PM    [facility] at [facility] [address] [place], [place], [place] [address]

## 2024-01-08 NOTE — Patient Instructions (Signed)
 Medication Instructions:  Your physician recommends that you continue on your current medications as directed. Please refer to the Current Medication list given to you today.   Labwork: None  Testing/Procedures: Your physician has requested that you have an echocardiogram. Echocardiography is a painless test that uses sound waves to create images of your heart. It provides your doctor with information about the size and shape of your heart and how well your heart's chambers and valves are working. This procedure takes approximately one hour. There are no restrictions for this procedure. Please do NOT wear cologne, perfume, aftershave, or lotions (deodorant is allowed). Please arrive 15 minutes prior to your appointment time.  Please note: We ask at that you not bring children with you during ultrasound (echo/ vascular) testing. Due to room size and safety concerns, children are not allowed in the ultrasound rooms during exams. Our front office staff cannot provide observation of children in our lobby area while testing is being conducted. An adult accompanying a patient to their appointment will only be allowed in the ultrasound room at the discretion of the ultrasound technician under special circumstances. We apologize for any inconvenience.   Follow-Up: Your physician recommends that you schedule a follow-up appointment in: 3 months  Any Other Special Instructions Will Be Listed Below (If Applicable). Thank you for choosing [facility]!      If you need a refill on your cardiac medications before your next appointment, please call your pharmacy.

## 2024-01-17 ENCOUNTER — Other Ambulatory Visit: Payer: Medicare Other

## 2024-01-31 ENCOUNTER — Ambulatory Visit: Payer: Medicare Other | Attending: Internal Medicine

## 2024-01-31 DIAGNOSIS — R079 Chest pain, unspecified: Secondary | ICD-10-CM | POA: Insufficient documentation

## 2024-02-01 LAB — ECHOCARDIOGRAM COMPLETE
AR max vel: 1.82 cm2
AV Area VTI: 1.74 cm2
AV Area mean vel: 2.01 cm2
AV Mean grad: 3 mm[Hg]
AV Peak grad: 5.3 mm[Hg]
Ao pk vel: 1.15 m/s
Area-P 1/2: 3.81 cm2
Calc EF: 53.4 %
MV VTI: 1.5 cm2
S' Lateral: 2.2 cm
Single Plane A2C EF: 51.4 %
Single Plane A4C EF: 55 %

## 2024-03-03 ENCOUNTER — Other Ambulatory Visit (HOSPITAL_COMMUNITY): Payer: Self-pay

## 2024-03-11 ENCOUNTER — Other Ambulatory Visit: Payer: Self-pay

## 2024-03-11 NOTE — Progress Notes (Signed)
 Specialty Pharmacy Refill Coordination Note  Tynika Luddy is a 53 y.o. female contacted today regarding refills of specialty medication(s) OnabotulinumtoxinA (Botox)   Patient requested Courier to Provider Office   Delivery date: 03/25/24   Verified address: [facility] [address] [place] [address] [place], [place]   Medication will be filled on 03/24/24.

## 2024-03-12 ENCOUNTER — Other Ambulatory Visit: Payer: Medicare Other

## 2024-03-17 ENCOUNTER — Other Ambulatory Visit: Payer: Self-pay

## 2024-03-19 ENCOUNTER — Ambulatory Visit: Payer: Medicare Other | Admitting: Physician Assistant

## 2024-03-20 ENCOUNTER — Other Ambulatory Visit (HOSPITAL_COMMUNITY): Payer: Self-pay

## 2024-03-24 ENCOUNTER — Other Ambulatory Visit: Payer: Self-pay

## 2024-03-28 ENCOUNTER — Ambulatory Visit (INDEPENDENT_AMBULATORY_CARE_PROVIDER_SITE_OTHER): Payer: Medicare Other | Admitting: Neurology

## 2024-03-28 DIAGNOSIS — G43709 Chronic migraine without aura, not intractable, without status migrainosus: Secondary | ICD-10-CM

## 2024-03-28 MED ORDER — ONABOTULINUMTOXINA 100 UNITS IJ SOLR
200.0000 [IU] | Freq: Once | INTRAMUSCULAR | Status: AC
  Administered 2024-03-28: 155 [IU] via INTRAMUSCULAR

## 2024-03-28 NOTE — Progress Notes (Signed)

## 2024-04-17 ENCOUNTER — Encounter: Payer: Self-pay | Admitting: Internal Medicine

## 2024-04-17 ENCOUNTER — Ambulatory Visit: Payer: Medicare Other | Attending: Internal Medicine | Admitting: Internal Medicine

## 2024-04-17 VITALS — BP 139/94 | HR 97 | Ht 63.0 in | Wt 172.0 lb

## 2024-04-17 DIAGNOSIS — I10 Essential (primary) hypertension: Secondary | ICD-10-CM | POA: Insufficient documentation

## 2024-04-17 DIAGNOSIS — R0789 Other chest pain: Secondary | ICD-10-CM | POA: Insufficient documentation

## 2024-04-17 MED ORDER — CARVEDILOL 6.25 MG PO TABS: 6.2500 mg | ORAL_TABLET | Freq: Two times a day (BID) | ORAL | 2 refills | Status: DC

## 2024-04-17 MED ORDER — CHLORTHALIDONE 25 MG PO TABS: 25.0000 mg | ORAL_TABLET | Freq: Every day | ORAL | 0 refills | Status: DC

## 2024-04-17 NOTE — Patient Instructions (Addendum)
 Medication Instructions:  Your physician has recommended you make the following change in your medication:  Stop taking Metoprolol   Start Coreg 6.25 mg twice daily  In two weeks call the office if blood pressure is greater than 130/80 then will start Chlorthalidone 25 mg once daily Continue taking all other medications as prescribed  Labwork: None  Testing/Procedures: None  Follow-Up: Your physician recommends that you schedule a follow-up appointment in: 3 months  Any Other Special Instructions Will Be Listed Below (If Applicable). Thank you for choosing [facility]!     If you need a refill on your cardiac medications before your next appointment, please call your pharmacy.

## 2024-04-17 NOTE — Progress Notes (Signed)
 Cardiology Office Note  Date: 04/17/2024   ID: Sharon Phillips, DOB Feb 11, 1971, MRN [id number]  PCP:  Gwenyth Leo, FNP  Cardiologist:  Marycarmen Hagey P Mackinley Kiehn, MD Electrophysiologist:  None   History of Present Illness: Sharon Phillips is a 53 y.o. female known to have HTN, history of bilateral mastectomy with breast implants in place now, is here for follow-up visit.  Patient had ER visit recently on 12/03/2023 for chest pain. BP was noted to be around 166/123 mmHg. Hs troponins within normal limits and EKG normal. She went to see her PCP who changed her antihypertensive medications, currently on lisinopril  20 mg twice daily and metoprolol  succinate 25 mg once daily after which her blood pressures are controlled as outpatient she does not check her blood pressures at home. I saw her in the last clinic visit, did not make any changes to her antihypertensive medications as she was just darted on BP meds by her PCP.  She is here for follow-up visit. Did not have any chest pains in the interval period.  She checked her blood pressures at home, ranges around 140 to 150 mmHg SBP.  She reported that after she got out and started walking, her chest pain is resolved.  Does not have any other symptoms of DOE, dizziness, syncope, palpitations or leg swelling.  Past Medical History:  Diagnosis Date   Anxiety    Cancer (HCC)    Depression    High cholesterol    Hypertension     Past Surgical History:  Procedure Laterality Date   ABDOMINAL HYSTERECTOMY     CENTRAL VENOUS CATHETER INSERTION Right 06/27/2021   Procedure: INSERTION CENTRAL LINE ADULT, tunneled;  Surgeon: Awilda Bogus, MD;  Location: [facility];  Service: General;  Laterality: Right;   MASTECTOMY     bilateral    Current Outpatient Medications  Medication Sig Dispense Refill   acetaminophen  (TYLENOL ) 325 MG tablet Take 650 mg by mouth every 6 (six) hours as needed for mild pain, moderate pain, fever or headache.     AJOVY  225  MG/1.5ML SOAJ Inject 225 mg into the skin every 30 (thirty) days. 1.68 mL 11   Ascorbic Acid (VITAMIN C) 100 MG tablet Take 100 mg by mouth daily.     botulinum toxin Type A  (BOTOX ) 200 units injection Inject 155 units IM into multiple site in the face,neck and head once every 90 days 1 each 4   cetirizine (ZYRTEC) 10 MG tablet Take 10 mg by mouth daily.     cholecalciferol (VITAMIN D3) 25 MCG (1000 UNIT) tablet Take 1,000 Units by mouth daily.     clonazePAM (KLONOPIN) 0.5 MG tablet Take 0.5 mg by mouth 2 (two) times daily as needed for anxiety.     exemestane  (AROMASIN ) 25 MG tablet Take 1 tablet by mouth once daily 30 tablet 5   fluticasone (FLONASE) 50 MCG/ACT nasal spray Place into both nostrils daily.     gabapentin  (NEURONTIN ) 300 MG capsule Take 300 mg by mouth 2 (two) times daily.     lisinopril  (ZESTRIL ) 20 MG tablet Take 20 mg by mouth 2 (two) times daily.     meclizine  (ANTIVERT ) 12.5 MG tablet Take 12.5 mg by mouth daily as needed for dizziness.     metoprolol  succinate (TOPROL -XL) 25 MG 24 hr tablet 1 tablet     Multiple Vitamin (MULTIVITAMIN) capsule Take 1 capsule by mouth daily.     NURTEC 75 MG TBDP Take 1 tablet (75 mg total) by  mouth daily as needed. 8 tablet 5   ondansetron  (ZOFRAN -ODT) 8 MG disintegrating tablet Take 8 mg by mouth every 8 (eight) hours as needed for nausea or vomiting.     prochlorperazine  (COMPAZINE ) 10 MG tablet Take 10 mg by mouth every 6 (six) hours as needed for nausea or vomiting.     rosuvastatin  (CRESTOR ) 5 MG tablet Take 5 mg by mouth daily.     sertraline  (ZOLOFT ) 50 MG tablet Take 50 mg by mouth daily.     SUMAtriptan  (IMITREX ) 100 MG tablet Take 1 tablet (100 mg total) by mouth as needed for migraine. May repeat in 2 hours if headache persists or recurs.  Maximum 2 tablets in 24 hours. 10 tablet 5   venlafaxine  XR (EFFEXOR -XR) 150 MG 24 hr capsule Take 150 mg by mouth daily.     vitamin E 180 MG (400 UNITS) capsule Take 400 Units by mouth daily.      No current facility-administered medications for this visit.   Allergies:  Chlorhexidine, Hibiclens [chlorhexidine gluconate], and Povidone-iodine   Social History: The patient  reports that she has never smoked. She has never used smokeless tobacco. She reports that she does not drink alcohol and does not use drugs.   Family History: The patient's family history includes Breast cancer in her maternal aunt; Ovarian cancer in her maternal aunt; Stroke in her father.   ROS:  Please see the history of present illness. Otherwise, complete review of systems is positive for none  All other systems are reviewed and negative.   Physical Exam: VS:  BP (!) 139/94   Pulse 97   Ht 5\' 3"  (1.6 m)   Wt 172 lb (78 kg)   SpO2 97%   BMI 30.47 kg/m , BMI Body mass index is 30.47 kg/m.  Wt Readings from Last 3 Encounters:  04/17/24 172 lb (78 kg)  01/08/24 170 lb 12.8 oz (77.5 kg)  12/03/23 176 lb (79.8 kg)    General: Patient appears comfortable at rest. HEENT: Conjunctiva and lids normal, oropharynx clear with moist mucosa. Neck: Supple, no elevated JVP or carotid bruits, no thyromegaly. Lungs: Clear to auscultation, nonlabored breathing at rest. Cardiac: Regular rate and rhythm, no S3 or significant systolic murmur, no pericardial rub. Abdomen: Soft, nontender, no hepatomegaly, bowel sounds present, no guarding or rebound. Extremities: No pitting edema, distal pulses 2+. Skin: Warm and dry. Musculoskeletal: No kyphosis. Neuropsychiatric: Alert and oriented x3, affect grossly appropriate.  Recent Labwork: 09/12/2023: ALT 39; AST 33 11/30/2023: TSH 1.93 12/03/2023: BUN 13; Creatinine, Ser 0.74; Hemoglobin 14.7; Platelets 142; Potassium 4.0; Sodium 137  No results found for: "CHOL", "TRIG", "HDL", "CHOLHDL", "VLDL", "LDLCALC", "LDLDIRECT"   Assessment and Plan:   Atypical chest pain: Chest pain resolved with exertion, likely noncardiac.  HTN, poorly controlled: Home BPs range around  140 to 150 mmHg SBP.  Switch metoprolol  to carvedilol 6.25 mg twice daily.  Check blood pressures at home.  If BP more than 130/80 mmHg, start chlorthalidone 25 mg once daily after 2 weeks.  Will provide 30-day supply of chlorthalidone.  Not a candidate for stress testing due to breast implants in place and concern for false positive stress test.  If she develops chest pains in the future, can recommend ETT versus CT cardiac depending on the nature of chest pains.      Medication Adjustments/Labs and Tests Ordered: Current medicines are reviewed at length with the patient today.  Concerns regarding medicines are outlined above.    Disposition:  Follow up 3 months  Signed Ariahna Smiddy Priya Pecola Haxton, MD, 04/17/2024 9:45 AM    [facility] at [facility] [address] [place], [place], [place] [address]

## 2024-05-05 ENCOUNTER — Other Ambulatory Visit: Payer: Self-pay | Admitting: Internal Medicine

## 2024-05-16 ENCOUNTER — Other Ambulatory Visit: Payer: Self-pay | Admitting: Internal Medicine

## 2024-05-29 NOTE — Progress Notes (Signed)
 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  Zofran  or Compazine  as needed Limit use of pain relievers to no more than 9 days out of the month to prevent risk of rebound or medication-overuse headache. Keep headache diary Follow up with PCP regarding BP Follow up in 6 months.    Subjective:  Sharon Phillips is a 53 year old female with HTN and history of breast cancer s/p mastectomy who follows up for migraines.  UPDATE: Feeling well Migraines: Intensity:  moderate (previously severe) Duration:  less than 1 day with Nurtec/sumatriptan  (previously 2 days) Frequency:  1 in past 3 months (previously 16-24 days a month)  Memory deficits: Due to memory problems, discontinued topiramate .  Memory improved.  Labs from 11/30/2023 showed B12 400 and TSH 1.93.       Current NSAIDS/analgesics:  none Current triptans:  sumatriptan  100mg  Current ergotamine:  none Current anti-emetic:  ondansetron , Compazine  10mg  Current muscle relaxants:  none Current Antihypertensive medications: metoprolol  succinate ER, lisinopril  Current Antidepressant medications:  venlafaxine  ER 150mg  daily, sertraline  50mg  daily Current Anticonvulsant medications:  gabapentin  300mg  BID Current anti-CGRP:  Ajovy , Nurtec PRN  Current Vitamins/Herbal/Supplements:  E Current Antihistamines/Decongestants:  Flonase, Zyrtec Other therapy:  BOTOX  Birth control:  none Other medications:  quetiapine  50mg  QHS, clonazepam QHS PRN   Caffeine :  no Diet:  water.  No soda.  Does not skip meals Exercise:  walksing Depression:  off and on; Anxiety:  yes Sleep hygiene:   wakes up frequently  HISTORY OF PRESENT ILLNESS: Migraines: Onset:  Occasional migraines since childhood.  Started getting worse n 2019 during chemotherapy for breast cancer.  Continued after she finished chemotherapy.  She was doing well on Botox .  However, her prior neurologist retired and she hasn't had Botox  since November.  Migraines have worsened.   Location:  diffuse, bandlike Quality:  squeezing, pounding Intensity:  severe Aura:  absent Prodrome:  absent Associated symptoms:  Nausea, photophobia, phonophobia, sometimes sees flashing light.  She denies associated unilateral numbness or weakness. Duration:  1 day when treated and on Botox .  Hasn't had Botox  since November.  No lasts 2 days. Frequency:  once a week when on Botox .  Prior neurologist retired.  Hasn't had Botox  since November.  Now occurs 2 to 3 times a week (16-24 days a month) Triggers:  unknown Relieving factors:  rest Activity:  aggravates  CT Head on 10/15/2022 personally reviewed was normal.   Memory deficits: She notes increased short term memory problems.  Started after starting chemotherapy.  Noted feeling sensation of dripping over her head.  She had MRI of brain with and without contrast on 09/26/2023, personally reviewed, which revealed no acute intracranial abnormality but did reveal a 1.4 cm enhancing lesion in the right parietal bone.  Follow up PET scan on 10/11/2023 showed no hypermetabolic activity to suggest metastasis.    Past NSAIDS/analgesics:  ibuprofen, Tylenol , Fioricet  Past abortive triptans:  none Past abortive ergotamine:  none Past muscle relaxants:  none Past anti-emetic:  none Past antihypertensive medications:  none Past antidepressant medications:  none Past anticonvulsant medications:  topiramate  (memory problems)  Past anti-CGRP:  none Past vitamins/Herbal/Supplements:  none Past antihistamines/decongestants:  none Other past  therapies:  none   Family history of headache:   no  PAST MEDICAL HISTORY: Past Medical History:  Diagnosis Date   Anxiety    Cancer (HCC)    Depression    High cholesterol    Hypertension     MEDICATIONS: Current Outpatient Medications on File Prior to Visit  Medication Sig Dispense Refill   acetaminophen  (TYLENOL ) 325 MG tablet Take 650 mg by mouth every 6 (six) hours as needed for mild pain, moderate pain, fever or headache.     AJOVY  225 MG/1.5ML SOAJ Inject 225 mg into the skin every 30 (thirty) days. 1.68 mL 11   Ascorbic Acid (VITAMIN C) 100 MG tablet Take 100 mg by mouth daily.     botulinum toxin Type A  (BOTOX ) 200 units injection Inject 155 units IM into multiple site in the face,neck and head once every 90 days 1 each 4   carvedilol  (COREG ) 6.25 MG tablet Take 1 tablet (6.25 mg total) by mouth 2 (two) times daily with a meal. 60 tablet 2   cetirizine (ZYRTEC) 10 MG tablet Take 10 mg by mouth daily.     chlorthalidone  (HYGROTON ) 25 MG tablet Take 1 tablet by mouth once daily 90 tablet 3   cholecalciferol (VITAMIN D3) 25 MCG (1000 UNIT) tablet Take 1,000 Units by mouth daily.     clonazePAM (KLONOPIN) 0.5 MG tablet Take 0.5 mg by mouth 2 (two) times daily as needed for anxiety.     exemestane  (AROMASIN ) 25 MG tablet Take 1 tablet by mouth once daily 30 tablet 5   fluticasone (FLONASE) 50 MCG/ACT nasal spray Place into both nostrils daily.     gabapentin  (NEURONTIN ) 300 MG capsule Take 300 mg by mouth 2 (two) times daily.     lisinopril  (ZESTRIL ) 20 MG tablet Take 20 mg by mouth 2 (two) times daily.     meclizine  (ANTIVERT ) 12.5 MG tablet Take 12.5 mg by mouth daily as needed for dizziness.     metoprolol  succinate (TOPROL -XL) 25 MG 24 hr tablet 1 tablet     Multiple Vitamin (MULTIVITAMIN) capsule Take 1 capsule by mouth daily.     NURTEC 75 MG TBDP Take 1 tablet (75 mg total) by mouth daily as needed. 8 tablet 5   ondansetron  (ZOFRAN -ODT) 8 MG disintegrating tablet Take 8 mg by mouth every 8 (eight) hours as needed for  nausea or vomiting.     prochlorperazine  (COMPAZINE ) 10 MG tablet Take 10 mg by mouth every 6 (six) hours as needed for nausea or vomiting.     rosuvastatin  (CRESTOR ) 5 MG tablet Take 5 mg by mouth daily.     sertraline  (ZOLOFT ) 50 MG tablet Take 50 mg by mouth daily.     SUMAtriptan  (IMITREX ) 100 MG tablet Take 1 tablet (100 mg total) by mouth as needed for migraine. May repeat in 2 hours if headache persists or recurs.  Maximum 2 tablets in 24 hours. 10 tablet 5   venlafaxine  XR (EFFEXOR -XR) 150 MG 24 hr capsule Take 150 mg by mouth daily.     vitamin E 180 MG (400 UNITS) capsule Take 400 Units by mouth daily.     No current facility-administered medications on file prior to visit.    ALLERGIES: Allergies  Allergen Reactions   Chlorhexidine Itching    Other reaction(s): Unknown   Hibiclens [Chlorhexidine Gluconate]    Povidone-Iodine     Other reaction(s): itching, Unknown    FAMILY HISTORY: Family History  Problem Relation Age of Onset  Stroke Father    Breast cancer Maternal Aunt    Ovarian cancer Maternal Aunt       Objective:  Blood pressure (!) 154/84, pulse 100, height 5' 5 (1.651 m), weight 168 lb (76.2 kg), SpO2 96%. General: No acute distress.  Patient appears well-groomed.   Head:  Normocephalic/atraumatic Neck:  Supple.  No paraspinal tenderness.  Full range of motion. Heart:  Regular rate and rhythm. Neuro:  Alert and oriented.  Speech fluent and not dysarthric.  Language intact.  CN II-XII intact.  Bulk and tone normal.  Muscle strength 5/5 throughout.  Sensation to light touch intact.  Deep tendon reflexes 2+ throughout, toes downgoing.  Gait normal.  Romberg negative.    Juliene Dunnings, DO  CC: Elsa Bucio, FNP

## 2024-05-30 ENCOUNTER — Ambulatory Visit (INDEPENDENT_AMBULATORY_CARE_PROVIDER_SITE_OTHER): Payer: Medicare Other | Admitting: Neurology

## 2024-05-30 ENCOUNTER — Encounter: Payer: Self-pay | Admitting: Neurology

## 2024-05-30 VITALS — BP 154/84 | HR 100 | Ht 65.0 in | Wt 168.0 lb

## 2024-05-30 DIAGNOSIS — R413 Other amnesia: Secondary | ICD-10-CM | POA: Diagnosis not present

## 2024-05-30 DIAGNOSIS — G43009 Migraine without aura, not intractable, without status migrainosus: Secondary | ICD-10-CM | POA: Diagnosis not present

## 2024-05-30 MED ORDER — NURTEC 75 MG PO TBDP: 1.0000 | ORAL_TABLET | Freq: Every day | ORAL | 5 refills | Status: AC | PRN

## 2024-05-30 MED ORDER — SUMATRIPTAN SUCCINATE 100 MG PO TABS: 100.0000 mg | ORAL_TABLET | ORAL | 5 refills | Status: AC | PRN

## 2024-05-30 NOTE — Patient Instructions (Addendum)
 Botox  every 3 months, Ajovy  every 4 weeks Sumatriptan  and Nurtec as needed.  Zofran  or Compazine  for nausea Limit use of pain relievers to no more than 9 days out of the month to prevent risk of rebound or medication-overuse headache. Keep headache diary

## 2024-06-13 ENCOUNTER — Other Ambulatory Visit: Payer: Self-pay

## 2024-06-13 NOTE — Progress Notes (Signed)
 Specialty Pharmacy Refill Coordination Note  Sharon Phillips is a 53 y.o. female assessed today regarding refills of clinic administered specialty medication(s) OnabotulinumtoxinA  (Botox )   Clinic requested Courier to Provider Office   Delivery date: 06/17/24   Injection date: 06/27/24  Verified address: [facility] [address] [place] [address] [place], [place]   Medication will be filled on 06/16/24.

## 2024-06-16 ENCOUNTER — Other Ambulatory Visit: Payer: Self-pay

## 2024-06-16 ENCOUNTER — Other Ambulatory Visit: Payer: Self-pay | Admitting: Neurology

## 2024-06-16 DIAGNOSIS — G43709 Chronic migraine without aura, not intractable, without status migrainosus: Secondary | ICD-10-CM

## 2024-06-17 ENCOUNTER — Other Ambulatory Visit: Payer: Self-pay

## 2024-06-17 MED ORDER — BOTOX 200 UNITS IJ SOLR
INTRAMUSCULAR | 4 refills | Status: AC
  Filled 2024-06-17: qty 1, 90d supply, fill #0
  Filled 2024-09-08: qty 1, 90d supply, fill #1
  Filled 2024-12-09: qty 1, 90d supply, fill #2

## 2024-06-27 ENCOUNTER — Ambulatory Visit (INDEPENDENT_AMBULATORY_CARE_PROVIDER_SITE_OTHER): Admitting: Neurology

## 2024-06-27 DIAGNOSIS — G43709 Chronic migraine without aura, not intractable, without status migrainosus: Secondary | ICD-10-CM

## 2024-06-27 MED ORDER — ONABOTULINUMTOXINA 100 UNITS IJ SOLR
200.0000 [IU] | Freq: Once | INTRAMUSCULAR | Status: AC
Start: 2024-06-27 — End: 2024-06-27
  Administered 2024-06-27: 155 [IU] via INTRAMUSCULAR

## 2024-06-27 NOTE — Progress Notes (Signed)

## 2024-07-04 ENCOUNTER — Other Ambulatory Visit: Payer: Self-pay | Admitting: Hematology

## 2024-07-04 DIAGNOSIS — Z853 Personal history of malignant neoplasm of breast: Secondary | ICD-10-CM

## 2024-07-04 DIAGNOSIS — C50411 Malignant neoplasm of upper-outer quadrant of right female breast: Secondary | ICD-10-CM

## 2024-07-14 ENCOUNTER — Other Ambulatory Visit: Payer: Self-pay | Admitting: Podiatry

## 2024-07-14 ENCOUNTER — Ambulatory Visit (INDEPENDENT_AMBULATORY_CARE_PROVIDER_SITE_OTHER): Admitting: Podiatry

## 2024-07-14 ENCOUNTER — Ambulatory Visit (INDEPENDENT_AMBULATORY_CARE_PROVIDER_SITE_OTHER)

## 2024-07-14 DIAGNOSIS — M2041 Other hammer toe(s) (acquired), right foot: Secondary | ICD-10-CM

## 2024-07-14 DIAGNOSIS — M21611 Bunion of right foot: Secondary | ICD-10-CM

## 2024-07-14 DIAGNOSIS — M2042 Other hammer toe(s) (acquired), left foot: Secondary | ICD-10-CM

## 2024-07-14 DIAGNOSIS — M21612 Bunion of left foot: Secondary | ICD-10-CM

## 2024-07-14 DIAGNOSIS — M21619 Bunion of unspecified foot: Secondary | ICD-10-CM | POA: Diagnosis not present

## 2024-07-14 NOTE — Progress Notes (Signed)
 Subjective:  Patient ID: Sharon Phillips, female    DOB: 1971-09-21,  MRN: [id number]  Chief Complaint  Patient presents with   Bunions    Pt stated that she has painful bunions and toes she is unable to wear close toed shoes because they cause her discomfort     Discussed the use of AI scribe software for clinical note transcription with the patient, who gave verbal consent to proceed.  History of Present Illness Sharon Phillips is a 53 year old female with bunions who presents with worsening foot pain and deformity.  She experiences significant discomfort from bunions, which prevents her from wearing closed shoes, necessitating the use of sandals. Pain intensifies throughout the day, with the right foot more affected. The bunions cause toe twisting, particularly the second toe on the right foot, which overlaps the big toe by day's end. Spacers used at night provide no relief.  There is a family history of similar foot deformities, as her mother had a similar condition corrected surgically in childhood. She suspects a genetic component due to her mother's twisted toes.  She is on long-term chemotherapy for cancer. No history of blood clots or bleeding problems.      Objective:    Physical Exam General: AAO x3, NAD  Dermatological: Skin is warm, dry and supple bilateral. There are no open sores, no preulcerative lesions, no rash or signs of infection present.  Vascular: Dorsalis Pedis artery and Posterior Tibial artery pedal pulses are 2/4 bilateral with immedate capillary fill time.  There is no pain with calf compression, swelling, warmth, erythema.   Neruologic: Grossly intact via light touch bilateral.   Musculoskeletal: Bunion deformities of moderate degree are present bilaterally right side worse than left.  There is a hammertoe contracture present in the right second toes overlapping the hallux.  There is tender palpation along the second toe as well as the bunions.  There is no  crepitation with MPJ range of motion.  No other areas of discomfort at this time.  Gait: Unassisted, Nonantalgic.     No images are attached to the encounter.    Results RADIOLOGY Foot X-ray: Medial deviation of the first metatarsal bone, overlapping of the second toe with the adjacent toe, and evidence of inflammation noted.  Intermetatarsal angle of about 14 degrees.   Assessment:   1. Bunion   2. Hammertoes of both feet      Plan:  Patient was evaluated and treated and all questions answered.  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 Non-surgical interventions ineffective. Significant discomfort and inability to wear closed shoes. X-rays confirm deformity. Surgical intervention recommended. - Schedule right foot bunion and hammer toe corrective surgery after family reunion in late September.  Discussed also bunionectomy with second hammertoe repair shortening second metatarsal 70 the right foot. - Obtain surgical clearance from oncologist due to cancer and ongoing oral medication, immunosuppression. - Provide pre-operative antibiotics to reduce infection risk. - Plan for wire removal six weeks post-surgery. - Advise minimal weight-bearing with crutches post-operatively. - The incision placement as well as the postoperative course was discussed with the patient. I discussed risks of the surgery which include, but not limited to, infection, bleeding, pain, swelling, need for further surgery, delayed or nonhealing, painful or ugly scar, numbness or sensation changes, over/under correction, recurrence, transfer lesions, further deformity, hardware failure, DVT/PE, loss of toe/foot. Patient understands these  risks and wishes to proceed with surgery. The surgical consent was reviewed with the patient all 3 pages were signed. No promises or guarantees were given to the  outcome of the procedure. All questions were answered to the best of my ability. Before the surgery the patient was encouraged to call the office if there is any further questions. The surgery will be performed at the [facility] on an outpatient basis.   No follow-ups on file.   Donnice JONELLE Fees DPM

## 2024-07-14 NOTE — Patient Instructions (Signed)

## 2024-07-18 ENCOUNTER — Telehealth: Payer: Self-pay | Admitting: Podiatry

## 2024-07-18 NOTE — Telephone Encounter (Signed)
 Received surgical consent form  Called and it rang and then states no voicemail set up.

## 2024-07-28 ENCOUNTER — Encounter: Payer: Self-pay | Admitting: Internal Medicine

## 2024-07-28 ENCOUNTER — Ambulatory Visit: Attending: Internal Medicine | Admitting: Internal Medicine

## 2024-07-28 VITALS — BP 118/78 | HR 95 | Ht 64.0 in | Wt 166.4 lb

## 2024-07-28 DIAGNOSIS — I10 Essential (primary) hypertension: Secondary | ICD-10-CM | POA: Insufficient documentation

## 2024-07-28 NOTE — Progress Notes (Signed)
 Cardiology Office Note  Date: 07/28/2024   ID: Sharon Phillips, DOB [birth year], MRN [id number]  PCP:  Alston Silvio BROCKS, FNP  Cardiologist:  Daymeon Fischman P Avram Danielson, MD Electrophysiologist:  None   History of Present Illness: Sharon Phillips is a 53 y.o. female known to have HTN, history of bilateral mastectomy with breast implants in place now, is here for follow-up visit.  May 2025: Patient had ER visit recently on 12/03/2023 for chest pain. BP was noted to be around 166/123 mmHg. Hs troponins within normal limits and EKG normal. She went to see her PCP who changed her antihypertensive medications, currently on lisinopril  20 mg twice daily and metoprolol  succinate 25 mg once daily after which her blood pressures are controlled as outpatient she does not check her blood pressures at home. I saw her in the last clinic visit, did not make any changes to her antihypertensive medications as she was just darted on BP meds by her PCP.  She is here for follow-up visit. Did not have any chest pains in the interval period.  She checked her blood pressures at home, ranges around 140 to 150 mmHg SBP.  She reported that after she got out and started walking, her chest pain is resolved.  Does not have any other symptoms of DOE, dizziness, syncope, palpitations or leg swelling.  August 2025: After making changes to her antihypertensive medications in the last clinic visit, her blood pressure significantly improved.  Does not have any more chest pains or SOB.  Home to pressures range around 110 mmHg SBP.  She is happy about it.  No dizziness, lightheadedness, syncope, palpitations, leg swelling.  Past Medical History:  Diagnosis Date   Anxiety    Cancer (HCC)    Depression    High cholesterol    Hypertension     Past Surgical History:  Procedure Laterality Date   ABDOMINAL HYSTERECTOMY     CENTRAL VENOUS CATHETER INSERTION Right 06/27/2021   Procedure: INSERTION CENTRAL LINE ADULT, tunneled;  Surgeon:  Kallie Manuelita BROCKS, MD;  Location: [facility];  Service: General;  Laterality: Right;   MASTECTOMY     bilateral    Current Outpatient Medications  Medication Sig Dispense Refill   acetaminophen  (TYLENOL ) 325 MG tablet Take 650 mg by mouth every 6 (six) hours as needed for mild pain, moderate pain, fever or headache.     AJOVY  225 MG/1.5ML SOAJ Inject 225 mg into the skin every 30 (thirty) days. 1.68 mL 11   Ascorbic Acid (VITAMIN C) 100 MG tablet Take 100 mg by mouth daily.     botulinum toxin Type A  (BOTOX ) 200 units injection Inject 155 units IM into multiple site in the face,neck and head once every 90 days 1 each 4   carvedilol  (COREG ) 6.25 MG tablet Take 1 tablet (6.25 mg total) by mouth 2 (two) times daily with a meal. 60 tablet 2   cetirizine (ZYRTEC) 10 MG tablet Take 10 mg by mouth daily.     chlorthalidone  (HYGROTON ) 25 MG tablet Take 1 tablet by mouth once daily 90 tablet 3   cholecalciferol (VITAMIN D3) 25 MCG (1000 UNIT) tablet Take 1,000 Units by mouth daily.     clonazePAM (KLONOPIN) 0.5 MG tablet Take 0.5 mg by mouth 2 (two) times daily as needed for anxiety.     exemestane  (AROMASIN ) 25 MG tablet Take 1 tablet by mouth once daily 30 tablet 3   fluticasone (FLONASE) 50 MCG/ACT nasal spray Place into both nostrils daily. (Patient  taking differently: Place into both nostrils as needed.)     gabapentin  (NEURONTIN ) 300 MG capsule Take 300 mg by mouth 2 (two) times daily.     lisinopril  (ZESTRIL ) 20 MG tablet Take 20 mg by mouth 2 (two) times daily.     meclizine  (ANTIVERT ) 12.5 MG tablet Take 12.5 mg by mouth daily as needed for dizziness.     metoprolol  succinate (TOPROL -XL) 25 MG 24 hr tablet 1 tablet     Multiple Vitamin (MULTIVITAMIN) capsule Take 1 capsule by mouth daily.     NURTEC 75 MG TBDP Take 1 tablet (75 mg total) by mouth daily as needed. 8 tablet 5   ondansetron  (ZOFRAN -ODT) 8 MG disintegrating tablet Take 8 mg by mouth every 8 (eight) hours as needed for nausea or  vomiting.     prochlorperazine  (COMPAZINE ) 10 MG tablet Take 10 mg by mouth every 6 (six) hours as needed for nausea or vomiting.     rosuvastatin  (CRESTOR ) 5 MG tablet Take 5 mg by mouth daily.     sertraline  (ZOLOFT ) 50 MG tablet Take 50 mg by mouth daily.     SUMAtriptan  (IMITREX ) 100 MG tablet Take 1 tablet (100 mg total) by mouth as needed for migraine. May repeat in 2 hours if headache persists or recurs.  Maximum 2 tablets in 24 hours. 10 tablet 5   venlafaxine  XR (EFFEXOR -XR) 150 MG 24 hr capsule Take 150 mg by mouth daily.     vitamin E 180 MG (400 UNITS) capsule Take 400 Units by mouth daily.     No current facility-administered medications for this visit.   Allergies:  Chlorhexidine, Hibiclens [chlorhexidine gluconate], and Povidone-iodine   Social History: The patient  reports that she has never smoked. She has never used smokeless tobacco. She reports that she does not drink alcohol and does not use drugs.   Family History: The patient's family history includes Breast cancer in her maternal aunt; Ovarian cancer in her maternal aunt; Stroke in her father.   ROS:  Please see the history of present illness. Otherwise, complete review of systems is positive for none  All other systems are reviewed and negative.   Physical Exam: VS:  BP 118/78   Pulse 95   Ht 5' 4 (1.626 m)   Wt 166 lb 6.4 oz (75.5 kg)   SpO2 96%   BMI 28.56 kg/m , BMI Body mass index is 28.56 kg/m.  Wt Readings from Last 3 Encounters:  07/28/24 166 lb 6.4 oz (75.5 kg)  05/30/24 168 lb (76.2 kg)  04/17/24 172 lb (78 kg)    General: Patient appears comfortable at rest. HEENT: Conjunctiva and lids normal, oropharynx clear with moist mucosa. Neck: Supple, no elevated JVP or carotid bruits, no thyromegaly. Lungs: Clear to auscultation, nonlabored breathing at rest. Cardiac: Regular rate and rhythm, no S3 or significant systolic murmur, no pericardial rub. Abdomen: Soft, nontender, no hepatomegaly, bowel  sounds present, no guarding or rebound. Extremities: No pitting edema, distal pulses 2+. Skin: Warm and dry. Musculoskeletal: No kyphosis. Neuropsychiatric: Alert and oriented x3, affect grossly appropriate.  Recent Labwork: 09/12/2023: ALT 39; AST 33 11/30/2023: TSH 1.93 12/03/2023: BUN 13; Creatinine, Ser 0.74; Hemoglobin 14.7; Platelets 142; Potassium 4.0; Sodium 137  No results found for: CHOL, TRIG, HDL, CHOLHDL, VLDL, LDLCALC, LDLDIRECT   Assessment and Plan:   Atypical chest pain: Resolved after blood pressure is controlled.  HTN,  controlled: Home blood pressures range around 110 mmHg.  Previously it was around 140 to  150 mmHg.  Continue current antihypertensive medications, carvedilol  625 mg twice daily, chlorthalidone  20 mg once daily and lisinopril  20 mg twice daily.  Obtain ultrasound renal artery Doppler.  I spent 20 minutes reviewing the prior records, more than 3 labs, discussing management of HTN and her symptoms.  Medication Adjustments/Labs and Tests Ordered: Current medicines are reviewed at length with the patient today.  Concerns regarding medicines are outlined above.    Disposition:  Follow up as needed  Signed Fatisha Rabalais Arleta Maywood, MD, 07/28/2024 10:17 AM    [facility] [address] [place], [place], [place] [address]

## 2024-07-28 NOTE — Patient Instructions (Signed)
 Medication Instructions:  Your physician recommends that you continue on your current medications as directed. Please refer to the Current Medication list given to you today.   Labwork: None  Testing/Procedures: Your physician has requested that you have a renal artery duplex. During this test, an ultrasound is used to evaluate blood flow to the kidneys. Allow one hour for this exam. Do not eat after midnight the day before and avoid carbonated beverages. Take your medications as you usually do.   Follow-Up: Your physician recommends that you schedule a follow-up appointment in: Follow up as needed  Any Other Special Instructions Will Be Listed Below (If Applicable). Thank you for choosing [facility]!     If you need a refill on your cardiac medications before your next appointment, please call your pharmacy.

## 2024-08-11 ENCOUNTER — Ambulatory Visit (HOSPITAL_COMMUNITY)
Admission: RE | Admit: 2024-08-11 | Discharge: 2024-08-11 | Disposition: A | Discharge status: Home / Self Care | Source: Ambulatory Visit | Attending: Internal Medicine | Admitting: Internal Medicine

## 2024-08-11 DIAGNOSIS — I10 Essential (primary) hypertension: Secondary | ICD-10-CM | POA: Diagnosis present

## 2024-08-14 ENCOUNTER — Other Ambulatory Visit: Payer: Self-pay | Admitting: Internal Medicine

## 2024-08-14 ENCOUNTER — Ambulatory Visit: Payer: Self-pay | Admitting: Internal Medicine

## 2024-08-18 NOTE — Telephone Encounter (Signed)
-----   Message from Vishnu P Mallipeddi sent at 08/14/2024  2:32 PM EDT ----- No renal artery stenosis. ----- Message ----- From: Interface, Three One Seven Sent: 08/11/2024   8:38 AM EDT To: Vishnu P Mallipeddi, MD

## 2024-08-18 NOTE — Telephone Encounter (Signed)
 The patient has been notified of the result and verbalized understanding.  All questions (if any) were answered. Sharon Phillips, CMA 08/18/2024 11:14 AM

## 2024-08-18 NOTE — Telephone Encounter (Signed)
 Patient returned staff call regarding results.

## 2024-09-03 ENCOUNTER — Telehealth: Payer: Self-pay

## 2024-09-03 ENCOUNTER — Other Ambulatory Visit (HOSPITAL_COMMUNITY): Payer: Self-pay

## 2024-09-03 ENCOUNTER — Telehealth: Payer: Self-pay | Admitting: Pharmacy Technician

## 2024-09-03 NOTE — Telephone Encounter (Signed)
 Pharmacy Patient Advocate Encounter  Received notification from [employer] that Prior Authorization for BOTOX  200 has been APPROVED from 9.1.25 to 10.1.26. Ran test claim, Copay is $0. This test claim was processed through [facility]- copay amounts may vary at other pharmacies due to pharmacy/plan contracts, or as the patient moves through the different stages of their insurance plan.   PA #/Case ID/Reference #: [id number]

## 2024-09-03 NOTE — Telephone Encounter (Signed)
 PA needed for Botox

## 2024-09-03 NOTE — Telephone Encounter (Signed)
 Pharmacy Patient Advocate Encounter   Received notification from Pt Calls Messages that prior authorization for BOTOX  200 is required/requested.   Insurance verification completed.   The patient is insured through [employer].   Per test claim: PA required; PA submitted to above mentioned insurance via Latent Key/confirmation #/EOC [id number] Status is pending

## 2024-09-03 NOTE — Telephone Encounter (Signed)
 PA has been submitted, and telephone encounter has been created. Please see telephone encounter dated 10.1.25.

## 2024-09-08 ENCOUNTER — Other Ambulatory Visit: Payer: Self-pay

## 2024-09-08 NOTE — Progress Notes (Signed)
 Specialty Pharmacy Refill Coordination Note  Shams Fill is a 53 y.o. female assessed today regarding refills of clinic administered specialty medication(s) OnabotulinumtoxinA  (Botox )   Clinic requested Courier to Provider Office   Delivery date: 09/18/24   Verified address: [facility] [address] [place] [address] [place], [place]   Medication will be filled on 09/17/24.

## 2024-09-10 ENCOUNTER — Telehealth: Payer: Self-pay | Admitting: Podiatry

## 2024-09-10 ENCOUNTER — Other Ambulatory Visit: Payer: Self-pay | Admitting: Podiatry

## 2024-09-10 DIAGNOSIS — M2041 Other hammer toe(s) (acquired), right foot: Secondary | ICD-10-CM | POA: Diagnosis not present

## 2024-09-10 DIAGNOSIS — M2010 Hallux valgus (acquired), unspecified foot: Secondary | ICD-10-CM | POA: Diagnosis not present

## 2024-09-10 DIAGNOSIS — Z9189 Other specified personal risk factors, not elsewhere classified: Secondary | ICD-10-CM | POA: Diagnosis not present

## 2024-09-10 DIAGNOSIS — M21549 Acquired clubfoot, unspecified foot: Secondary | ICD-10-CM | POA: Diagnosis not present

## 2024-09-10 MED ORDER — CEPHALEXIN 500 MG PO CAPS: 500.0000 mg | ORAL_CAPSULE | Freq: Three times a day (TID) | ORAL | 0 refills | Status: DC

## 2024-09-10 MED ORDER — PROMETHAZINE HCL 25 MG PO TABS: 25.0000 mg | ORAL_TABLET | Freq: Three times a day (TID) | ORAL | 0 refills | Status: AC | PRN

## 2024-09-10 MED ORDER — OXYCODONE-ACETAMINOPHEN 5-325 MG PO TABS: 1.0000 | ORAL_TABLET | Freq: Four times a day (QID) | ORAL | 0 refills | Status: DC | PRN

## 2024-09-10 NOTE — Telephone Encounter (Signed)
 Attempted to call patients oncologist per Dr.Wagoner to see if it was ok to put patient on Lovenox or other blood thinners for 2 weeks to help prevent blood clots. Left message with request for call back. Sending over letter as well.

## 2024-09-10 NOTE — Telephone Encounter (Signed)
 Received call back from oncologists office- after reviewing patients chart they have said the call to prescribe medications would be Dr.Wagoner's.

## 2024-09-11 ENCOUNTER — Telehealth: Payer: Self-pay | Admitting: Podiatry

## 2024-09-11 ENCOUNTER — Other Ambulatory Visit: Payer: Self-pay | Admitting: Podiatry

## 2024-09-11 MED ORDER — ENOXAPARIN SODIUM 40 MG/0.4ML IJ SOSY: 40.0000 mg | PREFILLED_SYRINGE | INTRAMUSCULAR | 0 refills | Status: DC

## 2024-09-11 NOTE — Telephone Encounter (Signed)
 I called the patient to check on her postop. She states she is doing well, no fevers/chills or other symptoms. Her pain is better controlled today.  She does feel that the boot is tight around the ankle. Advised her to go ahead and loosen the boot and if needed she can loosen the ACE bandage.  Given her history of cancer and medications, will go ahead and start Lovenox today. She is OK doing this and it was sent to the pharmacy.   Advised to call back with any questions or concerns or if there are any changes.

## 2024-09-15 ENCOUNTER — Ambulatory Visit (INDEPENDENT_AMBULATORY_CARE_PROVIDER_SITE_OTHER): Admitting: Podiatry

## 2024-09-15 ENCOUNTER — Ambulatory Visit (INDEPENDENT_AMBULATORY_CARE_PROVIDER_SITE_OTHER)

## 2024-09-15 VITALS — BP 119/73 | HR 106 | Temp 99.3°F

## 2024-09-15 DIAGNOSIS — Z9889 Other specified postprocedural states: Secondary | ICD-10-CM | POA: Diagnosis not present

## 2024-09-15 DIAGNOSIS — M21611 Bunion of right foot: Secondary | ICD-10-CM | POA: Diagnosis not present

## 2024-09-15 DIAGNOSIS — M21619 Bunion of unspecified foot: Secondary | ICD-10-CM

## 2024-09-15 NOTE — Progress Notes (Unsigned)
 Patient presents for post-op visit today, POV # 1 DOS 09/10/24 RT [place] BUNIONECTOMY, SHORTENING OF 2ND METATARSAL, 2ND DIGIT HAMMERTOE REPAIR  I think I have been doing good. Clemens two times, but my son was there to help me up. The first time was trying to use the crutches. Now I have a knee scooter. The 2nd time I had the scooter and I remember getting out of the bathroom and then I was on the dining room floor, then my son picked me up. I do not remember from bathroom to dining room. This was the first time this happened.  RN NOTE: Patient reports 2 falls post operatively. First fall was when trying to use crutches. Denies injury. 2nd fall, she reports having no memory of coming from bathroom to dining room. Reports being on floor She does not think that she hit her head and denies injuries. She also reports dizziness and drowsiness since surgery.   Vital Signs: Today's Vitals   09/15/24 1127  BP: 119/73  Pulse: (!) 106  Temp: 99.3 F (37.4 C)  TempSrc: Oral  SpO2: 98%  PainSc: 0-No pain    Radiographs: [x]  Taken []  Not taken  Surgical Site Assessment:  - Dressing:  [x]  Minimal dry blood, intact []  Reinforced   []  Changed     -RN Notes: n/a  - Incision:  [x]  CDI (clean, dry, intact)  []  Mild erythema  []  Drainage noted   -RN Notes: n/a  - Swelling:  []  None  [x]  Mild  []  Moderate   []  Significant    - Bruising:  []  None  [x]  Present: medial aspect of foot  - Steri-strips/Sutures/staples:  []  Intact   [x]  Removed Today   []  Plan to remove at next visit    -Cast/Splint/Pins: []  None [x]  Intact  []  Removed   []  Plan to remove per provider []  Replaced  -Signs of infection:  [x]  None  []  Present - Describe: n/a  -DME:    [x]  AFW []  Surgical shoe []  Cast  []  Splint  -Walking status:  []  Full WB  []  Partial WB  [x]  NWB  -Utilizing device:  []  None [x]  Knee Scooter []  Crutches []  Wheelchair    DVT assessment:  [x]  Denies symptoms []  Chest pain/SOB []  Pain in  calf/redness/warmth   Redressed DSD and ace wrap. Educated on signs of infection, proper dressing care, pain management, and weight bearing status. Patient will contact provider with any new or worsening symptoms. The provider assessed the patient today and reviewed instructions regarding plan of care.

## 2024-09-15 NOTE — Patient Instructions (Signed)

## 2024-09-17 ENCOUNTER — Other Ambulatory Visit: Payer: Self-pay

## 2024-09-18 ENCOUNTER — Telehealth: Payer: Self-pay | Admitting: Podiatry

## 2024-09-18 NOTE — Telephone Encounter (Signed)
 Noted

## 2024-09-18 NOTE — Telephone Encounter (Signed)
  Patient is unable to attend POV #2 appointment on 09/25/2024 due to a conflict with an oncology appointment. Would it be ok to reschedule her for 09/23/2024 instead?

## 2024-09-18 NOTE — Telephone Encounter (Signed)
 Patient called back stating that her ride asked her to get an earlier appointment on 10/21. Is it okay to schedule her on 10/21 at 8:30 ?

## 2024-09-23 ENCOUNTER — Encounter: Admitting: Podiatry

## 2024-09-23 ENCOUNTER — Ambulatory Visit (INDEPENDENT_AMBULATORY_CARE_PROVIDER_SITE_OTHER): Admitting: Podiatry

## 2024-09-23 DIAGNOSIS — M2042 Other hammer toe(s) (acquired), left foot: Secondary | ICD-10-CM

## 2024-09-23 DIAGNOSIS — M21619 Bunion of unspecified foot: Secondary | ICD-10-CM

## 2024-09-23 DIAGNOSIS — M2041 Other hammer toe(s) (acquired), right foot: Secondary | ICD-10-CM

## 2024-09-23 MED ORDER — CEPHALEXIN 500 MG PO CAPS: 500.0000 mg | ORAL_CAPSULE | Freq: Three times a day (TID) | ORAL | 0 refills | Status: AC

## 2024-09-23 NOTE — Progress Notes (Unsigned)
 Patient presents for post-op visit today, POV # 2 DOS 09/10/24 RT [place] BUNIONECTOMY, SHORTENING OF 2ND METATARSAL, 2ND DIGIT HAMMERTOE REPAIR  Sunday night got up to go to the bathroom and put weight on the foot, just for a second. Has had some tingling sensations, no pain..  RN Notes: Patient's family member in room today.   Vital Signs: Today's Vitals   09/23/24 1105  PainSc: 0-No pain      Radiographs: []  Taken [x]  Not taken  Surgical Site Assessment:  RN Notes: the 2nd toe is soft and has some maceration on the plantar aspect near the base.   - Dressing:  [x]  Minimal dry blood, intact []  Reinforced   []  Changed     -RN Notes: n/a  - Incision:  [x]  CDI (clean, dry, intact)  []  Mild erythema  []  Drainage noted   -RN Notes: n/a  - Swelling:  []  None  [x]  Mild  []  Moderate   []  Significant     -RN Notes: 2nd toe has some swelling  - Bruising:  []  None  [x]  Present: on top of foot and the 2nd toe has increased amounts of bruising and redness.    - Sutures/Staples:  []  None [x]  Intact  []  Removed Today  [x]  Plan to remove at next visit   -Cast/Splint/Pins: []  None [x]  Intact []  Removed Today []  Plan to remove at next visit []  Replaced  -Signs of infection:  [x]  None  []  Present - Describe: n/a  -DME:    []  None [x]  AFW []  Surgical shoe []  Cast  []  Splint  -Walking status:  []  Full WB  []  Partial WB  [x]  NWB  -Utilizing device:  []  None [x]  Knee Scooter []  Crutches []  Wheelchair    DVT assessment:  [x]  Denies symptoms []  Chest pain/SOB []  Pain in calf/redness/warmth   Redressed DSD and ace wrap. Educated on signs of infection, proper dressing care, pain management, and weight bearing status. Patient will contact provider with any new or worsening symptoms. The provider assessed the patient today and reviewed instructions regarding plan of care.  --  Patient was independently seen and evaluated by myself as well.  There is no surrounding erythema, ascending  cellulitis but is no change or pus.  The distal aspect of the second toe there appears to be an area of bruising just adjacent to the K wire.  There is no open lesions or any drainage.  There is good capillary refill time of the digit.  Along the sulcus, crease plantarly there is some macerated tissue with a superficial skin fissure.  Betadine was applied to this area followed by dressing.  Discussed that we may need to remove the K wire early however I like to try to leave this intact as long as possible.  Monitoring signs or symptoms of infection.  Sharon Phillips DPM

## 2024-09-25 ENCOUNTER — Encounter: Admitting: Podiatry

## 2024-09-26 ENCOUNTER — Ambulatory Visit (INDEPENDENT_AMBULATORY_CARE_PROVIDER_SITE_OTHER): Admitting: Neurology

## 2024-09-26 ENCOUNTER — Ambulatory Visit: Admitting: Neurology

## 2024-09-26 DIAGNOSIS — G43709 Chronic migraine without aura, not intractable, without status migrainosus: Secondary | ICD-10-CM | POA: Diagnosis not present

## 2024-09-26 MED ORDER — ONABOTULINUMTOXINA 100 UNITS IJ SOLR
200.0000 [IU] | Freq: Once | INTRAMUSCULAR | Status: AC
  Administered 2024-09-26: 155 [IU] via INTRAMUSCULAR

## 2024-09-26 NOTE — Progress Notes (Signed)

## 2024-09-30 ENCOUNTER — Other Ambulatory Visit: Payer: Self-pay

## 2024-09-30 ENCOUNTER — Ambulatory Visit (INDEPENDENT_AMBULATORY_CARE_PROVIDER_SITE_OTHER): Admitting: Podiatry

## 2024-09-30 ENCOUNTER — Ambulatory Visit

## 2024-09-30 ENCOUNTER — Emergency Department (HOSPITAL_COMMUNITY)
Admission: EM | Admit: 2024-09-30 | Discharge: 2024-09-30 | Disposition: A | Discharge status: Home / Self Care | Attending: Emergency Medicine | Admitting: Emergency Medicine

## 2024-09-30 ENCOUNTER — Emergency Department (HOSPITAL_COMMUNITY)

## 2024-09-30 DIAGNOSIS — Z853 Personal history of malignant neoplasm of breast: Secondary | ICD-10-CM | POA: Insufficient documentation

## 2024-09-30 DIAGNOSIS — R55 Syncope and collapse: Secondary | ICD-10-CM | POA: Diagnosis present

## 2024-09-30 DIAGNOSIS — R42 Dizziness and giddiness: Secondary | ICD-10-CM | POA: Insufficient documentation

## 2024-09-30 DIAGNOSIS — M21619 Bunion of unspecified foot: Secondary | ICD-10-CM

## 2024-09-30 DIAGNOSIS — Z79899 Other long term (current) drug therapy: Secondary | ICD-10-CM | POA: Diagnosis not present

## 2024-09-30 DIAGNOSIS — I10 Essential (primary) hypertension: Secondary | ICD-10-CM | POA: Diagnosis not present

## 2024-09-30 LAB — CBC
HCT: 47.9 % — ABNORMAL HIGH (ref 36.0–46.0)
Hemoglobin: 16.1 g/dL — ABNORMAL HIGH (ref 12.0–15.0)
MCH: 32.2 pg (ref 26.0–34.0)
MCHC: 33.6 g/dL (ref 30.0–36.0)
MCV: 95.8 fL (ref 80.0–100.0)
Platelets: 216 K/uL (ref 150–400)
RBC: 5 MIL/uL (ref 3.87–5.11)
RDW: 12.3 % (ref 11.5–15.5)
WBC: 5.5 K/uL (ref 4.0–10.5)
nRBC: 0 % (ref 0.0–0.2)

## 2024-09-30 LAB — CBG MONITORING, ED: Glucose-Capillary: 95 mg/dL (ref 70–99)

## 2024-09-30 LAB — BASIC METABOLIC PANEL WITH GFR
Anion gap: 11 (ref 5–15)
BUN: 13 mg/dL (ref 6–20)
CO2: 23 mmol/L (ref 22–32)
Calcium: 9.7 mg/dL (ref 8.9–10.3)
Chloride: 101 mmol/L (ref 98–111)
Creatinine, Ser: 0.9 mg/dL (ref 0.44–1.00)
GFR, Estimated: >60 mL/min (ref >60)
Glucose, Bld: 99 mg/dL (ref 70–99)
Potassium: 3.9 mmol/L (ref 3.5–5.1)
Sodium: 135 mmol/L (ref 135–145)

## 2024-09-30 MED ORDER — ACETAMINOPHEN 325 MG PO TABS
650.0000 mg | ORAL_TABLET | Freq: Once | ORAL | Status: AC
  Administered 2024-09-30: 650 mg via ORAL
  Filled 2024-09-30: qty 2

## 2024-09-30 MED ORDER — SODIUM CHLORIDE 0.9 % IV BOLUS: 1000.0000 mL | Freq: Once | INTRAVENOUS | Status: DC

## 2024-09-30 NOTE — ED Provider Notes (Signed)
 [facility] Provider Note   CSN: [id number] Arrival date & time: 09/30/24  1102     Patient presents with: Loss of Consciousness   Sharon Phillips is a 53 y.o. female with past medical history seen for hypertension, history of breast cancer, with bilateral mastectomy, with recent surgery for bunions/hammertoes who is at podiatry today for follow-up after bunionectomy and hammertoe repair, at the time she had a syncopal episode.  She denies any injury from the fall, reporting that she slept to the ground.  She reports that she is currently on Lovenox injections.  No previous history of blood clots.  She denies any chest pain, shortness of breath, feelings of heart racing.    Loss of Consciousness      Prior to Admission medications   Medication Sig Start Date End Date Taking? Authorizing Provider  acetaminophen  (TYLENOL ) 325 MG tablet Take 650 mg by mouth every 6 (six) hours as needed for mild pain, moderate pain, fever or headache.    [provider]  AJOVY  225 MG/1.5ML SOAJ Inject 225 mg into the skin every 30 (thirty) days. 11/30/23   Skeet Juliene SAUNDERS, DO  Ascorbic Acid (VITAMIN C) 100 MG tablet Take 100 mg by mouth daily.    [provider]  botulinum toxin Type A  (BOTOX ) 200 units injection Inject 155 units IM into multiple site in the face,neck and head once every 90 days 06/17/24   Skeet, Adam R, DO  carvedilol  (COREG ) 6.25 MG tablet TAKE 1 TABLET BY MOUTH TWICE DAILY WITH A MEAL 08/14/24   Mallipeddi, Vishnu P, MD  cephALEXin (KEFLEX) 500 MG capsule Take 1 capsule (500 mg total) by mouth 3 (three) times daily. 09/23/24   Gershon Donnice SAUNDERS, DPM  cetirizine (ZYRTEC) 10 MG tablet Take 10 mg by mouth daily. 04/13/21   [provider]  chlorthalidone  (HYGROTON ) 25 MG tablet Take 1 tablet by mouth once daily 05/16/24   Mallipeddi, Vishnu P, MD  cholecalciferol (VITAMIN D3) 25 MCG (1000 UNIT) tablet Take 1,000 Units by  mouth daily.    [provider]  clonazePAM (KLONOPIN) 0.5 MG tablet Take 0.5 mg by mouth 2 (two) times daily as needed for anxiety.    [provider]  exemestane  (AROMASIN ) 25 MG tablet Take 1 tablet by mouth once daily 07/07/24   Katragadda, Sreedhar, MD  fluticasone (FLONASE) 50 MCG/ACT nasal spray Place into both nostrils daily. Patient taking differently: Place into both nostrils as needed.    [provider]  gabapentin  (NEURONTIN ) 300 MG capsule Take 300 mg by mouth 2 (two) times daily. 04/13/21   [provider]  lisinopril  (ZESTRIL ) 20 MG tablet Take 20 mg by mouth 2 (two) times daily. 12/25/23   [provider]  meclizine  (ANTIVERT ) 12.5 MG tablet Take 12.5 mg by mouth daily as needed for dizziness.    [provider]  Multiple Vitamin (MULTIVITAMIN) capsule Take 1 capsule by mouth daily.    [provider]  NURTEC 75 MG TBDP Take 1 tablet (75 mg total) by mouth daily as needed. 05/30/24   Skeet, Adam R, DO  ondansetron  (ZOFRAN -ODT) 8 MG disintegrating tablet Take 8 mg by mouth every 8 (eight) hours as needed for nausea or vomiting. 05/04/21   [provider]  prochlorperazine  (COMPAZINE ) 10 MG tablet Take 10 mg by mouth every 6 (six) hours as needed for nausea or vomiting.    [provider]  promethazine (PHENERGAN) 25 MG tablet Take  1 tablet (25 mg total) by mouth every 8 (eight) hours as needed for nausea or vomiting. 09/10/24   Gershon Donnice SAUNDERS, DPM  rosuvastatin  (CRESTOR ) 5 MG tablet Take 5 mg by mouth daily. 01/09/22   [provider]  sertraline  (ZOLOFT ) 50 MG tablet Take 50 mg by mouth daily.    [provider]  SUMAtriptan  (IMITREX ) 100 MG tablet Take 1 tablet (100 mg total) by mouth as needed for migraine. May repeat in 2 hours if headache persists or recurs.  Maximum 2 tablets in 24 hours. 05/30/24   Skeet Juliene SAUNDERS, DO  venlafaxine  XR (EFFEXOR -XR) 150 MG 24 hr capsule Take 150 mg by mouth  daily. 01/09/22   [provider]  vitamin E 180 MG (400 UNITS) capsule Take 400 Units by mouth daily.    [provider]    Allergies: Chlorhexidine, Hibiclens [chlorhexidine gluconate], and Povidone-iodine    Review of Systems  Cardiovascular:  Positive for syncope.  All other systems reviewed and are negative.   Updated Vital Signs BP 126/72   Pulse 88   Temp (!) 97.5 F (36.4 C) (Oral)   Resp 17   SpO2 100%   Physical Exam Vitals and nursing note reviewed.  Constitutional:      General: She is not in acute distress.    Appearance: Normal appearance.  HENT:     Head: Normocephalic and atraumatic.  Eyes:     General:        Right eye: No discharge.        Left eye: No discharge.  Cardiovascular:     Rate and Rhythm: Normal rate and regular rhythm.     Heart sounds: No murmur heard.    No friction rub. No gallop.  Pulmonary:     Effort: Pulmonary effort is normal.     Breath sounds: Normal breath sounds.     Comments: Frequently with mild tachypnea, respiration improved on reassessment, no wheezing, rhonchi, stridor, rales. Abdominal:     General: Bowel sounds are normal.     Palpations: Abdomen is soft.  Musculoskeletal:     Comments: Head is atraumatic, no tenderness of midline cervical, lumbar, thoracic spine.  Moving all 4 limbs spontaneously.  Normal strength and coordination throughout.  Skin:    General: Skin is warm and dry.     Capillary Refill: Capillary refill takes less than 2 seconds.  Neurological:     Mental Status: She is alert and oriented to person, place, and time.  Psychiatric:        Mood and Affect: Mood normal.        Behavior: Behavior normal.     (all labs ordered are listed, but only abnormal results are displayed) Labs Reviewed  CBC - Abnormal; Notable for the following components:      Result Value   Hemoglobin 16.1 (*)    HCT 47.9 (*)    All other components within normal limits  BASIC METABOLIC PANEL WITH GFR   CBG MONITORING, ED    EKG: EKG Interpretation Date/Time:  Tuesday September 30 2024 11:10:42 EDT Ventricular Rate:  81 PR Interval:  154 QRS Duration:  93 QT Interval:  371 QTC Calculation: 431 R Axis:   37  Text Interpretation: Sinus rhythm Confirmed by Bernard Drivers ([id number]) on 09/30/2024 11:20:58 AM  Radiology: DG Chest Portable 1 View Result Date: 09/30/2024 CLINICAL DATA:  Shortness of breath EXAM: PORTABLE CHEST 1 VIEW COMPARISON:  Chest radiograph dated 12/03/2023 FINDINGS: Normal lung volumes.  No focal consolidations. No pleural effusion or pneumothorax. The heart size and mediastinal contours are within normal limits. No acute osseous abnormality. Left axillary surgical clips. IMPRESSION: No active disease. Electronically Signed   By: Limin  Xu M.D.   On: 09/30/2024 12:41   CT Head Wo Contrast Result Date: 09/30/2024 CLINICAL DATA:  Head trauma. Undergoing foot procedure 1 became hypotensive. EXAM: CT HEAD WITHOUT CONTRAST TECHNIQUE: Contiguous axial images were obtained from the base of the skull through the vertex without intravenous contrast. RADIATION DOSE REDUCTION: This exam was performed according to the departmental dose-optimization program which includes automated exposure control, adjustment of the mA and/or kV according to patient size and/or use of iterative reconstruction technique. COMPARISON:  10/15/2022 and 12/04/2023 FINDINGS: Brain: Ventricles, cisterns and other CSF spaces are within normal. Subtle asymmetry of the lateral ventricles unchanged. Basal ganglia calcifications are present. There is no mass, mass effect, shift of midline structures or acute hemorrhage. No evidence of acute infarction. Vascular: No hyperdense vessel or unexpected calcification. Skull: Normal. Negative for fracture or focal lesion. Sinuses/Orbits: No acute finding. Other: None IMPRESSION: No acute findings. Electronically Signed   By: Toribio Agreste M.D.   On: 09/30/2024 12:15      Procedures   Medications Ordered in the ED  acetaminophen  (TYLENOL ) tablet 650 mg (650 mg Oral Given 09/30/24 1131)                                    Medical Decision Making Amount and/or Complexity of Data Reviewed Labs: ordered. Radiology: ordered.  Risk OTC drugs.   This patient is a 53 y.o. female  who presents to the ED for concern of syncope.   Differential diagnoses prior to evaluation: The emergent differential diagnosis includes, but is not limited to,  CVA, ACS, arrhythmia, vasovagal / orthostatic hypotension, sepsis, hypoglycemia, electrolyte disturbance, respiratory failure, anemia, dehydration, heat injury, polypharmacy, malignancy, anxiety/panic attack.  . This is not an exhaustive differential.   Past Medical History / Co-morbidities / Social History: Hypertension, anxiety, depression, hyperlipidemia, breast cancer, bunions, hammertoes  Additional history: Chart reviewed. Pertinent results include: Reviewed recent procedures by podiatry  Physical Exam: Physical exam performed. The pertinent findings include: No focal neurologic deficits, initially with some mild tachycardia, and tachypnea which have both resolved on reevaluation.  Initially somewhat hypertensive, blood pressure 152/81, again resolved without intervention.  Lab Tests/Imaging studies: I personally interpreted labs/imaging and the pertinent results include: CBC, BMP are overall unremarkable, mild polycythemia may suggest some hemoconcentration.  Given her double mastectomy status we will encourage oral fluids..  CT head with no acute intracranial abnormality, plain, chest x-ray with no intrathoracic abnormality.  I agree with the radiologist interpretation.  Cardiac monitoring: EKG obtained and interpreted by myself and attending physician which shows: Normal sinus rhythm, no acute ST ST changes   Medications: I ordered medication including Tylenol  for pain, oral fluids for possible mild  dehydration.  I have reviewed the patients home medicines and have made adjustments as needed.   Disposition: After consideration of the diagnostic results and the patients response to treatment, I feel that patient with probable vasovagal syncope, vital signs stable on reevaluation, lab work overall reassuring, patient discharged in stable condition.   emergency department workup does not suggest an emergent condition requiring admission or immediate intervention beyond what has been performed at this time. The plan is: as above. The patient is safe for discharge  and has been instructed to return immediately for worsening symptoms, change in symptoms or any other concerns.   Final diagnoses:  Syncope and collapse  Vertigo    ED Discharge Orders          Ordered    Ambulatory referral to Cardiology        09/30/24 [address] 09/30/24 1259    Bernard Drivers, MD 10/01/24 1504

## 2024-09-30 NOTE — ED Triage Notes (Signed)
 Pt. BIB [facility] from [facility] with c/o syncopal episode; Pt. Was a having a procedure done to her foot when episode happened. Per [facility] the pt.'s BP dropped to 90/60 during episode but now has increased; Pt. Has a Hx of double mastectomy; Pt. Is lethargic and confused upon arrival.

## 2024-09-30 NOTE — Discharge Instructions (Addendum)
 I suspect that you had what is called a vasovagal syncope which is a reaction to the procedures that you are having at the podiatrist which can cause a sudden loss of blood pressure and passing out, your workup in the ED was reassuring, you have any additional episodes of lightheadedness, passing out recommend returning for evaluation, otherwise you can drink plenty of fluids, and follow-up closely with your primary care doctor.

## 2024-09-30 NOTE — Progress Notes (Signed)
 Chief Complaint  Patient presents with   Post-op Follow-up    DOS 09/10/24 [facility] [place] BUNIONECTOMY, SHORTENING OF 2ND METATARSAL, 2ND DIGIT HAMMERTOE REPAIR  Feeling throbbing in the foot. This has been coming and going, mostly at night for a couple of days now. Only on top of the foot.   Patient was seen and evaluated by myself.  K wire still intact but he drainage or pus.  There is some bruising, dried blister noted to the distal, plantar aspect the toe.  There is good capillary fill time to the toe.  There is no drainage or pus coming from the K wire.  There is still macerated area on the sulcus of the second toe.  Due to this I did remove the K wire partially in the cut and bent this.  She tolerated the procedure well.  This will hopefully allow the toe not to be staying plantarflexed to allow the wound on the plantar aspect of toe to heal. She was AAO x 3 during this portion of the visit.   Sutures were then removed.  During suture removal with the RN the patient then had a syncopal episode.  Blood pressure was taken and it was low.  Blood pressure did come back up but she had vision changes and headache.  EMS called and brought to the hospital. See separate RN note.   Dressing reapplied today.  Remain in cam boot.  She can weight-bear machine in the cam boot.  Ice, elevation.  Contacted patient's daughter to let her know that she was transferred to the hospital.   Donnice JONELLE Fees DPM  ---    Patient presents for post-op visit today, DOS 09/10/24 [facility] [place] BUNIONECTOMY, SHORTENING OF 2ND METATARSAL, 2ND DIGIT HAMMERTOE REPAIR  Feeling throbbing in the foot. This has been coming and going, mostly at night for a couple of days now. Only on top of the foot..  RN Notes: Patient arrived to office for suture removal. While performing suture removal patient reported feeling dizzy then lost consciousness. Emergency protocol activated and vitals taken. BP low at 96/63. Glucose Checked: 114.  Patient placed in trendelenburg. BP did come back up, however patient complained of frontal headache, vision blurriness, confusion. EMS called and patient transported to [facility] for further eval. Family notified. Emergency response form will be scanned into chart.   Vital Signs: Today's Vitals   09/30/24 9062  PainSc: 0-No pain      Radiographs: [x]  Taken []  Not taken  Surgical Site Assessment:  - Dressing:  [x]  Minimal dry blood, intact []  Reinforced   [x]  Changed     -RN Notes: n/a  - Incision:  [x]  CDI (clean, dry, intact)  [x]  Mild erythema  [x]  Drainage noted     - Swelling:  []  None  [x]  Mild  []  Moderate   []  Significant     -RN Notes: n/a  - Bruising:  []  None  [x]  Present: 2nd toe and on top of foot.    - Sutures/Staples:  []  None [x]  Intact  [x]  Removed Today  []  Plan to remove at next visit   -Cast/Splint/Pins: []  None [x]  Intact []  Removed Today [x]  Plan to remove at next visit []  Replaced  -Signs of infection:  [x]  None  []  Present - Describe: n/a  -DME:    []  None [x]  AFW []  Surgical shoe []  Cast  []  Splint  -Walking status:  []  Full WB  []  Partial WB  [x]  NWB  -  Utilizing device:  []  None [x]  Knee Scooter []  Crutches []  Wheelchair    DVT assessment:  [x]  Denies symptoms []  Chest pain/SOB []  Pain in calf/redness/warmth

## 2024-10-01 ENCOUNTER — Telehealth: Payer: Self-pay

## 2024-10-01 NOTE — Telephone Encounter (Signed)
 Return call to patient after visit emergency situation on 09/30/24.  Patient states I do not know what happened at the doctor yesterday, I woke up in the hospital. I do not remember what happened after he cut the pin. Can you tell me what happened?  This RN explained events that occurred yesterday. Patient asked about falling and wanted to know if she fell. This RN explained that patient did not fall at any point during the visit. This RN explained that the patient was transferred from exam chair to the stretcher by provider, RN and paramedics with transfer sheet. Patient verbalized understanding. Patient did take migraine medication (did not give name) last night and slept all night. Patient reports feeling better today. Reviewed follow up appointment instructions, she is not comfortable changing the dressing so will follow up next week for dressing change. Her daughter, Donny, will attend this visit with her. This RN did explain that the sutures had been removed already.   Patient thanked this RN for taking care of her and wanted to say thank you to the staff and providers as well.

## 2024-10-07 ENCOUNTER — Inpatient Hospital Stay: Attending: Physician Assistant

## 2024-10-07 ENCOUNTER — Inpatient Hospital Stay: Payer: Medicare Other

## 2024-10-07 DIAGNOSIS — T451X5A Adverse effect of antineoplastic and immunosuppressive drugs, initial encounter: Secondary | ICD-10-CM | POA: Insufficient documentation

## 2024-10-07 DIAGNOSIS — Z1721 Progesterone receptor positive status: Secondary | ICD-10-CM | POA: Insufficient documentation

## 2024-10-07 DIAGNOSIS — Z923 Personal history of irradiation: Secondary | ICD-10-CM | POA: Insufficient documentation

## 2024-10-07 DIAGNOSIS — Z1502 Genetic susceptibility to malignant neoplasm of ovary: Secondary | ICD-10-CM | POA: Insufficient documentation

## 2024-10-07 DIAGNOSIS — Z8041 Family history of malignant neoplasm of ovary: Secondary | ICD-10-CM | POA: Insufficient documentation

## 2024-10-07 DIAGNOSIS — Z1501 Genetic susceptibility to malignant neoplasm of breast: Secondary | ICD-10-CM | POA: Insufficient documentation

## 2024-10-07 DIAGNOSIS — M8580 Other specified disorders of bone density and structure, unspecified site: Secondary | ICD-10-CM | POA: Insufficient documentation

## 2024-10-07 DIAGNOSIS — Z9013 Acquired absence of bilateral breasts and nipples: Secondary | ICD-10-CM | POA: Insufficient documentation

## 2024-10-07 DIAGNOSIS — Z1509 Genetic susceptibility to other malignant neoplasm: Secondary | ICD-10-CM | POA: Insufficient documentation

## 2024-10-07 DIAGNOSIS — Z90710 Acquired absence of both cervix and uterus: Secondary | ICD-10-CM | POA: Insufficient documentation

## 2024-10-07 DIAGNOSIS — Z170 Estrogen receptor positive status [ER+]: Secondary | ICD-10-CM | POA: Insufficient documentation

## 2024-10-07 DIAGNOSIS — R7401 Elevation of levels of liver transaminase levels: Secondary | ICD-10-CM | POA: Insufficient documentation

## 2024-10-07 DIAGNOSIS — Z803 Family history of malignant neoplasm of breast: Secondary | ICD-10-CM | POA: Insufficient documentation

## 2024-10-07 DIAGNOSIS — E559 Vitamin D deficiency, unspecified: Secondary | ICD-10-CM

## 2024-10-07 DIAGNOSIS — C50911 Malignant neoplasm of unspecified site of right female breast: Secondary | ICD-10-CM | POA: Insufficient documentation

## 2024-10-07 DIAGNOSIS — G620 Drug-induced polyneuropathy: Secondary | ICD-10-CM | POA: Insufficient documentation

## 2024-10-07 DIAGNOSIS — C50912 Malignant neoplasm of unspecified site of left female breast: Secondary | ICD-10-CM | POA: Insufficient documentation

## 2024-10-07 DIAGNOSIS — Z1732 Human epidermal growth factor receptor 2 negative status: Secondary | ICD-10-CM | POA: Insufficient documentation

## 2024-10-07 DIAGNOSIS — Z79899 Other long term (current) drug therapy: Secondary | ICD-10-CM | POA: Insufficient documentation

## 2024-10-07 DIAGNOSIS — Z90722 Acquired absence of ovaries, bilateral: Secondary | ICD-10-CM | POA: Insufficient documentation

## 2024-10-07 DIAGNOSIS — Z79811 Long term (current) use of aromatase inhibitors: Secondary | ICD-10-CM | POA: Insufficient documentation

## 2024-10-07 LAB — CBC WITH DIFFERENTIAL/PLATELET
Abs Immature Granulocytes: 0.01 K/uL (ref 0.00–0.07)
Basophils Absolute: 0.1 K/uL (ref 0.0–0.1)
Basophils Relative: 1 %
Eosinophils Absolute: 0.2 K/uL (ref 0.0–0.5)
Eosinophils Relative: 3 %
HCT: 43.2 % (ref 36.0–46.0)
Hemoglobin: 14.8 g/dL (ref 12.0–15.0)
Immature Granulocytes: 0 %
Lymphocytes Relative: 48 %
Lymphs Abs: 2.5 K/uL (ref 0.7–4.0)
MCH: 32.4 pg (ref 26.0–34.0)
MCHC: 34.3 g/dL (ref 30.0–36.0)
MCV: 94.5 fL (ref 80.0–100.0)
Monocytes Absolute: 0.5 K/uL (ref 0.1–1.0)
Monocytes Relative: 10 %
Neutro Abs: 2 K/uL (ref 1.7–7.7)
Neutrophils Relative %: 38 %
Platelets: 201 K/uL (ref 150–400)
RBC: 4.57 MIL/uL (ref 3.87–5.11)
RDW: 12.4 % (ref 11.5–15.5)
WBC: 5.2 K/uL (ref 4.0–10.5)
nRBC: 0 % (ref 0.0–0.2)

## 2024-10-07 LAB — COMPREHENSIVE METABOLIC PANEL WITH GFR
ALT: 59 U/L — ABNORMAL HIGH (ref 0–44)
AST: 39 U/L (ref 15–41)
Albumin: 4.5 g/dL (ref 3.5–5.0)
Alkaline Phosphatase: 126 U/L (ref 38–126)
Anion gap: 12 (ref 5–15)
BUN: 15 mg/dL (ref 6–20)
CO2: 26 mmol/L (ref 22–32)
Calcium: 9.7 mg/dL (ref 8.9–10.3)
Chloride: 101 mmol/L (ref 98–111)
Creatinine, Ser: 0.92 mg/dL (ref 0.44–1.00)
GFR, Estimated: >60 mL/min (ref >60)
Glucose, Bld: 106 mg/dL — ABNORMAL HIGH (ref 70–99)
Potassium: 4.2 mmol/L (ref 3.5–5.1)
Sodium: 140 mmol/L (ref 135–145)
Total Bilirubin: 0.5 mg/dL (ref 0.0–1.2)
Total Protein: 7.3 g/dL (ref 6.5–8.1)

## 2024-10-07 LAB — VITAMIN D 25 HYDROXY (VIT D DEFICIENCY, FRACTURES): Vit D, 25-Hydroxy: 38.45 ng/mL (ref 30–100)

## 2024-10-08 LAB — CANCER ANTIGEN 27.29: CA 27.29: 27.4 U/mL (ref 0.0–38.6)

## 2024-10-08 LAB — CANCER ANTIGEN 15-3: CA 15-3: 23.5 U/mL (ref 0.0–25.0)

## 2024-10-09 ENCOUNTER — Ambulatory Visit: Admitting: Podiatry

## 2024-10-09 ENCOUNTER — Encounter: Payer: Self-pay | Admitting: Podiatry

## 2024-10-09 ENCOUNTER — Ambulatory Visit (INDEPENDENT_AMBULATORY_CARE_PROVIDER_SITE_OTHER)

## 2024-10-09 VITALS — Ht 64.0 in | Wt 166.4 lb

## 2024-10-09 DIAGNOSIS — M21619 Bunion of unspecified foot: Secondary | ICD-10-CM

## 2024-10-09 DIAGNOSIS — M21611 Bunion of right foot: Secondary | ICD-10-CM

## 2024-10-09 DIAGNOSIS — M2041 Other hammer toe(s) (acquired), right foot: Secondary | ICD-10-CM

## 2024-10-09 DIAGNOSIS — M2042 Other hammer toe(s) (acquired), left foot: Secondary | ICD-10-CM

## 2024-10-12 NOTE — Progress Notes (Signed)
 Subjective: Chief Complaint  Patient presents with   Routine Post Op    POV # 3 DOS 09/10/24 [facility] [place] BUNIONECTOMY, SHORTENING OF 2ND METATARSAL, 2ND DIGIT HAMMERTOE REPAIR, pt states everything is going well, still in pain, continues to wear cam boot and ambulate with knee scooter, no other complaints.   53 year old female presents the office today above concerns.  She that she is doing well and she is feeling much better.  She does not take any pain medication at this time.  She does not report any fevers or chills.  Objective: AAO x3, NAD DP/PT pulses palpable bilaterally, CRT less than 3 seconds Incisions healing well.  There is no evidence of dehiscence.  There is no surrounding erythema, ascending cellulitis there is no drainage or pus.  Toes are rectus.  K wire intact to the second toe without any drainage or pus.  The area that is of concern on the plantar aspect the toe is resolving.  The area of moisture has resolved.  There is some area still of a dried blood blister which is starting to come off.  No purulence or any fluctuation with the position. No pain with calf compression, swelling, warmth, erythema  Assessment: Status post right foot surgery  Plan: -All treatment options discussed with the patient including all alternatives, risks, complications.  -X-rays obtained reviewed.  Multiple views obtained.  Hardware intact but any complicating factors.  No evidence of acute fracture. -Incisions are healing well the area of the second toe is also improving.  Small amounts of Betadine applied followed by dressing.  Discussed they can do dressing changes every couple of days at home. -Continue cam boot, elevation -Medication if needed -Patient encouraged to call the office with any questions, concerns, change in symptoms.   Return in about 2 weeks (around 10/23/2024) for post-op, x-ray, pin removal .  Sharon Phillips Fees DPM

## 2024-10-13 NOTE — Progress Notes (Unsigned)
 [facility] [address], [place] [address]   CLINIC:  Medical Oncology/Hematology  PCP:  Sharon Silvio BROCKS, FNP [address] / [place] [address] [phone]   REASON FOR VISIT:  Follow-up for synchronous bilateral breast cancer  PRIOR THERAPY: - Bilateral mastectomy (05/21/2018) - Adjuvant Adriamycin and cyclophosphamide x 4 cycles completed on 09/11/2018 - 12 weeks of paclitaxel completed on 12/18/2018 - XRT to bilateral chest walls from 01/30/2020 through 03/07/2019  CURRENT THERAPY: Exemestane  (since 12/12/2018)  BRIEF ONCOLOGIC HISTORY:   Oncology History  Bilateral breast cancer (HCC)  08/01/2021 Initial Diagnosis   Bilateral breast cancer (HCC)    Genetic Testing   [facility] CancerNext Panel identified one pathogenic 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 Report date is 02/06/2022.  The CancerNext gene panel offered by [facility] includes sequencing, rearrangement analysis, and RNA analysis for the following 36 genes:   APC, ATM, AXIN2, BARD1, BMPR1A, BRCA1, BRCA2, BRIP1, CDH1, CDK4, CDKN2A, CHEK2, DICER1, HOXB13, EPCAM, GREM1, MLH1, MSH2, MSH3, MSH6, MUTYH, NBN, NF1, NTHL1, PALB2, PMS2, POLD1, POLE, PTEN, RAD51C, RAD51D, RECQL, SMAD4, SMARCA4, STK11, and TP53.      CANCER STAGING: Cancer Staging  Bilateral breast cancer [facility]) Staging form: Breast, AJCC 8th Edition - Clinical stage from 08/01/2021: Stage IB (cT1c, cN1, cM0, G2, ER+, PR+, HER2-) - Unsigned   INTERVAL HISTORY:   [name], a 53 y.o. female, returns for routine follow-up of her synchronous bilateral breast cancer.  Sharon Phillips was last seen on 10/16/2023 by Dr. Rogers.   At today's visit, she  reports feeling ***.   ***She denies any recent hospitalizations, surgeries, or changes in her  baseline health status. She reports ***% energy and ***% appetite.   ***She is maintaining stable weight at this time.  ***She denies any new chest wall lumps or axillary  lymphadenopathy. ***No new onset aches/pains, headaches, or abdominal pain. ***No new seizures or focal neurologic deficit.  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 ***Chronic intermittent nausea (since chemotherapy) stable with Zofran  and Compazine  as needed.  *** ***She takes calcium  and vitamin D  supplements ***  ASSESSMENT & PLAN:  1.  Synchronous bilateral breast cancer # T1CN1A right breast IDC, ER/PR positive, HER2 negative  # T1CN1A left breast TNBC  - Right breast IDC (UOQ) 1.5 cm, grade 2, T1c, N1a 1/2 lymph nodes positive with extranodal extension, ER/PR positive, HER2 negative - Left breast IDC, 2 cm, grade 2, 1/10 lymph nodes positive, TMB C, T1CN1A - Treated with bilateral mastectomy on 05/21/2018 - Adjuvant Adriamycin and cyclophosphamide 4 cycles completed on 09/11/2018, 12 weeks of paclitaxel completed on 12/18/2018 - Exemestane  started on 12/12/2018 - Radiation therapy to bilateral chest walls from 01/29/2019 through 03/07/2019 - 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 Changes consistent with HPV effect. Uterus, cervix, bilateral fallopian tubes and ovaries are negative for malignancy.  - MRI of the brain on 09/26/2023: 1.4 cm enhancing lesion in the right parietal region suspicious for bone metastatic disease. Whole-body PET scan (10/11/2023): No hypermetabolic activity in the skull to suggest breast cancer metastases.  No evidence of breast cancer recurrence or metastases on whole-body FDG PET scan. Most recent CT head (obtained via ED for other indications  on 09/30/2024) negative for any fracture or focal lesion in skull - She is tolerating  exemestane  well.***  Goal of treatment is 10 years (through January 2030), per Dr. Rogers. - Most recent labs (10/07/2024):  CBC unremarkable. CMP with minimally elevated ALT (59), otherwise normal. CA 27.29 and CA 15-3 are WNL -*** Physical exam *** - No red flag symptoms per patient history/ROS today *** - PLAN:***Check US  liver due to elevated ALT. - Continue exemestane  daily.  *** - *** Otherwise, RTC in 1 year for follow-up with tumor markers and labs.  ***  2.  PALB2 heterozygosity -  Tested positive for a single pathogenic variant (harmful genetic change) in the PALB2 gene. Specifically, this variant is  [id number].  The remaining 35 genes analyzed were negative. - Per genetic counselor note (02/14/2022), recommendations include: Annual mammogram beginning at age 82, annual breast MRI with contrast beginning at age 55.  (S/p mastectomy) Prophylactic risk-reducing mastectomy.  (Therapeutic mastectomy completed) Prophylactic BSO at age 48.  (Completed) Pancreatic cancer screening in patients with first-degree or second-degree relative affected with pancreatic cancer.  (Referred to Dr. San due to limited family medical history, who is following MRI abdomen/MRCP every 1 to 2 years, next due April 2026.) Testing for family members >40 years of age.  3.  Peripheral neuropathy, chemotherapy-induced - Currently well-controlled with medication *** - PLAN: Continue gabapentin  300 mg 3 times daily ***  4.  Nausea: - Chronic intermittent nausea stable. *** - PLAN: Continue Zofran  and Compazine  as needed  5.  Osteopenia, secondary to cancer therapy - DEXA (08/05/2021): T-score -0.8, normal - DEXA (08/16/2023): T-score -1.1, osteopenia - She takes calcium  and vitamin D  *** - Most recent labs (see 10/07/2024): Calcium  9.7, vitamin D  38.45 - PLAN: Continue calcium , vitamin D , and weightbearing exercises to optimize bone health.  Repeat DEXA in September 2026.  6.  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 - Non-smoker. - Maternal aunt had breast cancer.  Another maternal aunt had ovarian cancer.  Maternal grandmother had unspecified female cancer.  PLAN SUMMARY: >> *** >> *** >> ***    REVIEW OF SYSTEMS: ***  Review of Systems - Oncology  PHYSICAL EXAM:   Performance status (ECOG): {CHL ONC ED:8845999799} *** There were no vitals filed for this visit. Wt Readings from Last 3 Encounters:  10/09/24 166 lb 6.4 oz (75.5 kg)  07/28/24 166 lb 6.4 oz (75.5 kg)  05/30/24 168 lb (76.2 kg)   Physical Exam   PAST MEDICAL/SURGICAL HISTORY:  Past Medical History:  Diagnosis Date   Anxiety    Cancer (HCC)    Depression    High cholesterol    Hypertension    Past Surgical History:  Procedure Laterality Date   ABDOMINAL HYSTERECTOMY     CENTRAL VENOUS CATHETER INSERTION Right 06/27/2021   Procedure: INSERTION CENTRAL LINE ADULT, tunneled;  Surgeon: Kallie Manuelita BROCKS, MD;  Location: [facility];  Service: General;  Laterality: Right;   MASTECTOMY     bilateral    SOCIAL HISTORY:  Social History   Socioeconomic History   Marital status: Legally Separated    Spouse name: Not on file   Number of children: 4   Years of education: Not on file   Highest education level: Not on file  Occupational History   Not on file  Tobacco Use   Smoking status: Never   Smokeless tobacco: Never  Vaping Use   Vaping status: Never Used  Substance and Sexual Activity   Alcohol use: Never   Drug use: Never   Sexual activity: Not on file  Other Topics Concern   Not on file  Social History Narrative   Are you right handed or left handed? Right   Are you currently employed ?    What is your current occupation?   Do you live at home alone? NO   Who lives with you? Two kids   What type of home do you live in: 1 story or 2 story? 2       Social Drivers of Corporate Investment Banker Strain: Medium Risk (08/01/2021)    Overall Financial Resource Strain (CARDIA)    Difficulty of Paying Living Expenses: Somewhat hard  Food Insecurity: Food Insecurity Present (08/01/2021)   Hunger Vital Sign    Worried About Running Out of Food in the Last Year: Sometimes true    Ran Out of Food in the Last Year: Sometimes true  Transportation Needs: No Transportation Needs (08/01/2021)   PRAPARE - Administrator, Civil Service (Medical): No    Lack of Transportation (Non-Medical): No  Physical Activity: Sufficiently Active (08/01/2021)   Exercise Vital Sign    Days of Exercise per Week: 7 days    Minutes of Exercise per Session: 30 min  Stress: No Stress Concern Present (08/01/2021)   [employer] of Occupational Health - Occupational Stress Questionnaire    Feeling of Stress : Only a little  Social Connections: Moderately Isolated (08/01/2021)   Social Connection and Isolation Panel    Frequency of Communication with Friends and Family: More than three times a week    Frequency of Social Gatherings with Friends and Family: More than three times a week    Attends Religious Services: 1 to 4 times per year    Active Member of [employer] or Organizations: No    Attends Banker Meetings: Never    Marital Status: Separated  Intimate Partner Violence: Not At Risk (06/12/2023)   Received from [facility]   Humiliation, Afraid, Rape, and Kick questionnaire    Within the last year, have you been afraid of your partner or ex-partner?: No    Within the last year, have you been humiliated or emotionally abused in other ways by your partner or ex-partner?: No    Within the last year, have you been kicked, hit, slapped, or otherwise physically hurt by your partner or ex-partner?: No    Within the last year, have you been raped or forced to have any kind of sexual activity by your partner or ex-partner?: No    FAMILY HISTORY:  Family History  Problem Relation Age of Onset   Stroke Father    Breast cancer  Maternal Aunt    Ovarian cancer Maternal Aunt     CURRENT MEDICATIONS:  Current Outpatient Medications  Medication Sig Dispense Refill   acetaminophen  (TYLENOL ) 325 MG tablet Take 650 mg by mouth every 6 (six) hours as needed for mild pain, moderate pain, fever or headache.     AJOVY  225 MG/1.5ML SOAJ Inject 225 mg into the skin every 30 (thirty) days. 1.68 mL 11   Ascorbic Acid (VITAMIN C) 100 MG tablet Take 100 mg by mouth daily.     botulinum toxin Type A  (BOTOX ) 200 units injection Inject 155 units IM into multiple site in the face,neck and head once every 90 days 1 each 4   carvedilol  (COREG ) 6.25 MG tablet TAKE 1  TABLET BY MOUTH TWICE DAILY WITH A MEAL 180 tablet 3   cephALEXin (KEFLEX) 500 MG capsule Take 1 capsule (500 mg total) by mouth 3 (three) times daily. 21 capsule 0   cetirizine (ZYRTEC) 10 MG tablet Take 10 mg by mouth daily.     chlorthalidone  (HYGROTON ) 25 MG tablet Take 1 tablet by mouth once daily 90 tablet 3   cholecalciferol (VITAMIN D3) 25 MCG (1000 UNIT) tablet Take 1,000 Units by mouth daily.     clonazePAM (KLONOPIN) 0.5 MG tablet Take 0.5 mg by mouth 2 (two) times daily as needed for anxiety.     exemestane  (AROMASIN ) 25 MG tablet Take 1 tablet by mouth once daily 30 tablet 3   fluticasone (FLONASE) 50 MCG/ACT nasal spray Place into both nostrils daily. (Patient taking differently: Place into both nostrils as needed.)     gabapentin  (NEURONTIN ) 300 MG capsule Take 300 mg by mouth 2 (two) times daily.     lisinopril  (ZESTRIL ) 20 MG tablet Take 20 mg by mouth 2 (two) times daily.     meclizine  (ANTIVERT ) 12.5 MG tablet Take 12.5 mg by mouth daily as needed for dizziness.     Multiple Vitamin (MULTIVITAMIN) capsule Take 1 capsule by mouth daily.     NURTEC 75 MG TBDP Take 1 tablet (75 mg total) by mouth daily as needed. 8 tablet 5   ondansetron  (ZOFRAN -ODT) 8 MG disintegrating tablet Take 8 mg by mouth every 8 (eight) hours as needed for nausea or vomiting.      prochlorperazine  (COMPAZINE ) 10 MG tablet Take 10 mg by mouth every 6 (six) hours as needed for nausea or vomiting.     promethazine (PHENERGAN) 25 MG tablet Take 1 tablet (25 mg total) by mouth every 8 (eight) hours as needed for nausea or vomiting. 20 tablet 0   rosuvastatin  (CRESTOR ) 5 MG tablet Take 5 mg by mouth daily.     sertraline  (ZOLOFT ) 50 MG tablet Take 50 mg by mouth daily.     SUMAtriptan  (IMITREX ) 100 MG tablet Take 1 tablet (100 mg total) by mouth as needed for migraine. May repeat in 2 hours if headache persists or recurs.  Maximum 2 tablets in 24 hours. 10 tablet 5   venlafaxine  XR (EFFEXOR -XR) 150 MG 24 hr capsule Take 150 mg by mouth daily.     vitamin E 180 MG (400 UNITS) capsule Take 400 Units by mouth daily.     No current facility-administered medications for this visit.    ALLERGIES:  Allergies  Allergen Reactions   Chlorhexidine Itching    Other reaction(s): Unknown   Hibiclens [Chlorhexidine Gluconate]    Povidone-Iodine     Other reaction(s): itching, Unknown    LABORATORY DATA:  I have reviewed the labs as listed.     Latest Ref Rng & Units 10/07/2024    1:58 PM 09/30/2024   11:20 AM 12/03/2023    8:29 PM  CBC  WBC 4.0 - 10.5 K/uL 5.2  5.5  8.0   Hemoglobin 12.0 - 15.0 g/dL 85.1  83.8  85.2   Hematocrit 36.0 - 46.0 % 43.2  47.9  43.0   Platelets 150 - 400 K/uL 201  216  142       Latest Ref Rng & Units 10/07/2024    1:58 PM 09/30/2024   11:20 AM 12/03/2023    8:29 PM  CMP  Glucose 70 - 99 mg/dL 893  99  839   BUN 6 - 20 mg/dL 15  13  13   Creatinine 0.44 - 1.00 mg/dL 9.07  9.09  9.25   Sodium 135 - 145 mmol/L 140  135  137   Potassium 3.5 - 5.1 mmol/L 4.2  3.9  4.0   Chloride 98 - 111 mmol/L 101  101  103   CO2 22 - 32 mmol/L 26  23  25    Calcium  8.9 - 10.3 mg/dL 9.7  9.7  9.6   Total Protein 6.5 - 8.1 g/dL 7.3     Total Bilirubin 0.0 - 1.2 mg/dL 0.5     Alkaline Phos 38 - 126 U/L 126     AST 15 - 41 U/L 39     ALT 0 - 44 U/L 59        DIAGNOSTIC IMAGING:  I have independently reviewed the scans and discussed with the patient. DG Foot Complete Right Result Date: 10/09/2024 Please see detailed radiograph report in office note.  DG Chest Portable 1 View Result Date: 09/30/2024 CLINICAL DATA:  Shortness of breath EXAM: PORTABLE CHEST 1 VIEW COMPARISON:  Chest radiograph dated 12/03/2023 FINDINGS: Normal lung volumes. No focal consolidations. No pleural effusion or pneumothorax. The heart size and mediastinal contours are within normal limits. No acute osseous abnormality. Left axillary surgical clips. IMPRESSION: No active disease. Electronically Signed   By: Limin  Xu M.D.   On: 09/30/2024 12:41   CT Head Wo Contrast Result Date: 09/30/2024 CLINICAL DATA:  Head trauma. Undergoing foot procedure 1 became hypotensive. EXAM: CT HEAD WITHOUT CONTRAST TECHNIQUE: Contiguous axial images were obtained from the base of the skull through the vertex without intravenous contrast. RADIATION DOSE REDUCTION: This exam was performed according to the departmental dose-optimization program which includes automated exposure control, adjustment of the mA and/or kV according to patient size and/or use of iterative reconstruction technique. COMPARISON:  10/15/2022 and 12/04/2023 FINDINGS: Brain: Ventricles, cisterns and other CSF spaces are within normal. Subtle asymmetry of the lateral ventricles unchanged. Basal ganglia calcifications are present. There is no mass, mass effect, shift of midline structures or acute hemorrhage. No evidence of acute infarction. Vascular: No hyperdense vessel or unexpected calcification. Skull: Normal. Negative for fracture or focal lesion. Sinuses/Orbits: No acute finding. Other: None IMPRESSION: No acute findings. Electronically Signed   By: Toribio Agreste M.D.   On: 09/30/2024 12:15   DG Foot Complete Right Result Date: 09/15/2024 Please see detailed radiograph report in office note.    WRAP UP:  All questions  were answered. The patient knows to call the clinic with any problems, questions or concerns.  Medical decision making: ***  Time spent on visit: I spent {CHL ONC TIME VISIT - DTPQU:8845999869} counseling the patient face to face. The total time spent in the appointment was {CHL ONC TIME VISIT - DTPQU:8845999869} and more than 50% was on counseling.  Pleasant CHRISTELLA Barefoot, PA-C  ***

## 2024-10-14 ENCOUNTER — Inpatient Hospital Stay: Admitting: Physician Assistant

## 2024-10-14 ENCOUNTER — Inpatient Hospital Stay: Payer: Medicare Other | Admitting: Physician Assistant

## 2024-10-14 VITALS — BP 120/61 | HR 91 | Temp 98.4°F | Resp 18 | Ht 64.0 in | Wt 163.0 lb

## 2024-10-14 DIAGNOSIS — R748 Abnormal levels of other serum enzymes: Secondary | ICD-10-CM

## 2024-10-14 DIAGNOSIS — C50412 Malignant neoplasm of upper-outer quadrant of left female breast: Secondary | ICD-10-CM

## 2024-10-14 DIAGNOSIS — Z09 Encounter for follow-up examination after completed treatment for conditions other than malignant neoplasm: Secondary | ICD-10-CM

## 2024-10-14 DIAGNOSIS — M8580 Other specified disorders of bone density and structure, unspecified site: Secondary | ICD-10-CM

## 2024-10-14 DIAGNOSIS — Z853 Personal history of malignant neoplasm of breast: Secondary | ICD-10-CM

## 2024-10-14 DIAGNOSIS — C50411 Malignant neoplasm of upper-outer quadrant of right female breast: Secondary | ICD-10-CM

## 2024-10-14 DIAGNOSIS — C50911 Malignant neoplasm of unspecified site of right female breast: Secondary | ICD-10-CM | POA: Diagnosis not present

## 2024-10-14 DIAGNOSIS — E559 Vitamin D deficiency, unspecified: Secondary | ICD-10-CM

## 2024-10-14 DIAGNOSIS — Z170 Estrogen receptor positive status [ER+]: Secondary | ICD-10-CM

## 2024-10-14 MED ORDER — EXEMESTANE 25 MG PO TABS: 25.0000 mg | ORAL_TABLET | Freq: Every day | ORAL | 3 refills | Status: AC

## 2024-10-14 NOTE — Patient Instructions (Addendum)
 [facility] at [facility] **VISIT SUMMARY & IMPORTANT INSTRUCTIONS **   You were seen today by Pleasant Barefoot PA-C for your history of breast cancer.    HISTORY OF BREAST CANCER Your most recent labs and physical exam did not show any evidence of recurrent breast cancer. Continue to take exemestane  daily. We we will see you for follow-up labs and physical exam in 1 year. Please reach out to Dr. Leora (plastic surgeon at [facility]) to discuss your breast implant discomfort.  He saw you in September 2024, and can discuss options with you.  His office number is [phone].  ABNORMAL LABS One of your test showed slightly elevated liver numbers.  This may be caused by inflammation, medication side effect, or various other causes. Due to your history of cancer, we will check ultrasound of your liver just to make sure you do not have any new liver abnormalities.   OSTEOPENIA Your mild osteopenia (weak and fragile bones) is related to age and postmenopausal status, as well as your exemestane  pill which you are taking for treatment of your breast cancer. You do not need any specific medication to treat your osteopenia at this time, but you should continue to take vitamin D  and strengthen your bones with weightbearing exercises. We will check another bone density scan in September 2026.  FOLLOW-UP APPOINTMENT: 1 year  ** Thank you for trusting me with your healthcare!  I strive to provide all of my patients with quality care at each visit.  If you receive a survey for this visit, I would be so grateful to you for taking the time to provide feedback.  Thank you in advance!  ~ Jael Waldorf                                        Dr. Mickiel Davonna Pleasant Barefoot, PA-C        Delon Hope, NP   - - - - - - - - - - - - - - - - - -    Thank you for choosing [facility] at [facility] to provide your oncology and hematology care.  To afford  each patient quality time with our provider, please arrive at least 15 minutes before your scheduled appointment time.   If you have a lab appointment with the Cancer Center please come in thru the Main Entrance and check in at the main information desk.  You need to re-schedule your appointment should you arrive 10 or more minutes late.  We strive to give you quality time with our providers, and arriving late affects you and other patients whose appointments are after yours.  Also, if you no show three or more times for appointments you may be dismissed from the clinic at the providers discretion.     Again, thank you for choosing [facility].  Our hope is that these requests will decrease the amount of time that you wait before being seen by our physicians.       _____________________________________________________________  Should you have questions after your visit to [facility], please contact our office at [phone] and follow the prompts.  Our office hours are 8:00 a.m. and 4:30 p.m. Monday - Friday.  Please note that voicemails left after 4:00 p.m. may not be returned until the following  business day.  We are closed weekends and major holidays.  You do have access to a nurse 24-7, just call the main number to the clinic [phone] and do not press any options, hold on the line and a nurse will answer the phone.    For prescription refill requests, have your pharmacy contact our office and allow 72 hours.

## 2024-10-21 ENCOUNTER — Ambulatory Visit: Payer: Self-pay | Admitting: Physician Assistant

## 2024-10-21 ENCOUNTER — Ambulatory Visit (HOSPITAL_COMMUNITY)
Admission: RE | Admit: 2024-10-21 | Discharge: 2024-10-21 | Disposition: A | Discharge status: Home / Self Care | Source: Ambulatory Visit | Attending: Physician Assistant | Admitting: Physician Assistant

## 2024-10-21 DIAGNOSIS — R748 Abnormal levels of other serum enzymes: Secondary | ICD-10-CM | POA: Insufficient documentation

## 2024-10-21 NOTE — Progress Notes (Signed)
 US  liver was obtained due to elevated ALT in this patient with history of bilateral breast cancer and PALB2 mutation.  Ultrasound results reviewed, likely indicates hepatic steatosis (fatty liver infiltration). No evidence of malignancy in liver.  NURSES: Please call patient to let her know that there was no evidence of recurrent cancer based on her liver ultrasound.  Ultrasound showed possible fatty liver disease, which can be caused by accumulation of fat cells in the liver.  The best treatment for this is lifestyle modification with increased exercise and decreased intake of sugary and fatty foods.  She should follow-up with her PCP regarding elevated liver numbers and findings of suspected fatty liver disease.  We will see her for follow-up as scheduled next year.  [name], PA-C 10/21/24 4:25 PM

## 2024-10-22 ENCOUNTER — Other Ambulatory Visit (HOSPITAL_COMMUNITY)

## 2024-10-22 NOTE — Progress Notes (Signed)
 Discussed results with patient and recommendations.  Verbalized understanding.

## 2024-10-23 ENCOUNTER — Encounter: Payer: Self-pay | Admitting: Podiatry

## 2024-10-23 ENCOUNTER — Ambulatory Visit (INDEPENDENT_AMBULATORY_CARE_PROVIDER_SITE_OTHER): Admitting: Podiatry

## 2024-10-23 ENCOUNTER — Ambulatory Visit (INDEPENDENT_AMBULATORY_CARE_PROVIDER_SITE_OTHER)

## 2024-10-23 VITALS — Ht 64.0 in | Wt 163.0 lb

## 2024-10-23 DIAGNOSIS — R2681 Unsteadiness on feet: Secondary | ICD-10-CM

## 2024-10-23 DIAGNOSIS — M21611 Bunion of right foot: Secondary | ICD-10-CM

## 2024-10-23 DIAGNOSIS — M21619 Bunion of unspecified foot: Secondary | ICD-10-CM

## 2024-10-27 NOTE — Progress Notes (Signed)
 Subjective: Chief Complaint  Patient presents with   Routine Post Op    POV # 4 DOS 09/10/24 RT [place] BUNIONECTOMY, SHORTENING OF 2ND METATARSAL, 2ND DIGIT HAMMERTOE REPAIR    53 year old female presents the office today above concerns.  She states that she is doing well she is not having any pain at this time.  She remains in the cam boot.  She does not report any fevers or chills.  Objective: AAO x3, NAD-presents with daughter DP/PT pulses palpable bilaterally, CRT less than 3 seconds Incisions healing well.  There is no evidence of dehiscence.  K wire intact the second toe without any drainage or pus.  There is still some edema present at surgical sites.  There is no signs of infection.  No significant pain on exam today. No pain with calf compression, swelling, warmth, erythema  Assessment: Status post right foot surgery  Plan: -All treatment options discussed with the patient including all alternatives, risks, complications.  -X-rays were obtained reviewed.  Multiple views were obtained today.  Is no evidence of acute fracture.  Increased consolidation noted. -K wire removed today in total without any complications.  She tolerated well.  Antibiotic ointment applied followed by dressing. -She is sure to wash the foot with soap and water tomorrow, dry thoroughly and apply a similar bandage. -Continue to ice, elevate as well as compression -Referral to PT placed -Monitor for any clinical signs or symptoms of infection and directed to call the office immediately should any occur or go to the ER.  Return in about 3 weeks (around 11/13/2024) for post-op, x-ray.  Sharon Phillips DPM

## 2024-11-03 ENCOUNTER — Other Ambulatory Visit: Payer: Self-pay | Admitting: Podiatry

## 2024-11-04 ENCOUNTER — Ambulatory Visit: Admitting: Physical Therapy

## 2024-11-04 DIAGNOSIS — M25674 Stiffness of right foot, not elsewhere classified: Secondary | ICD-10-CM | POA: Insufficient documentation

## 2024-11-04 DIAGNOSIS — R262 Difficulty in walking, not elsewhere classified: Secondary | ICD-10-CM | POA: Insufficient documentation

## 2024-11-04 DIAGNOSIS — M25671 Stiffness of right ankle, not elsewhere classified: Secondary | ICD-10-CM | POA: Insufficient documentation

## 2024-11-04 DIAGNOSIS — M21619 Bunion of unspecified foot: Secondary | ICD-10-CM | POA: Diagnosis not present

## 2024-11-04 DIAGNOSIS — R600 Localized edema: Secondary | ICD-10-CM | POA: Diagnosis present

## 2024-11-04 DIAGNOSIS — M79674 Pain in right toe(s): Secondary | ICD-10-CM | POA: Insufficient documentation

## 2024-11-04 DIAGNOSIS — R2681 Unsteadiness on feet: Secondary | ICD-10-CM | POA: Insufficient documentation

## 2024-11-04 DIAGNOSIS — M25571 Pain in right ankle and joints of right foot: Secondary | ICD-10-CM | POA: Insufficient documentation

## 2024-11-04 NOTE — Therapy (Signed)
 OUTPATIENT PHYSICAL THERAPY LOWER EXTREMITY EVALUATION   Patient Name: Sharon Phillips MRN: [id number] DOB:26-Jan-1971, 53 y.o., female Today's Date: 11/04/2024  END OF SESSION:  PT End of Session - 11/04/24 1059     Visit Number 1    Number of Visits 16    Date for Recertification  12/30/24    Authorization Type Medicare and Medicaid    PT Start Time 1100    PT Stop Time 1140    PT Time Calculation (min) 40 min    Activity Tolerance Patient tolerated treatment well          Past Medical History:  Diagnosis Date   Anxiety    Cancer (HCC)    Depression    High cholesterol    Hypertension    Past Surgical History:  Procedure Laterality Date   ABDOMINAL HYSTERECTOMY     CENTRAL VENOUS CATHETER INSERTION Right 06/27/2021   Procedure: INSERTION CENTRAL LINE ADULT, tunneled;  Surgeon: Kallie Manuelita BROCKS, MD;  Location: [facility];  Service: General;  Laterality: Right;   MASTECTOMY     bilateral   Patient Active Problem List   Diagnosis Date Noted   Atypical chest pain 01/08/2024   Chronic migraine without aura, not intractable, without status migrainosus 02/15/2022   Anxiety 02/15/2022   Mixed anxiety depressive disorder 02/15/2022   Monoallelic mutation of PALB2 gene 02/10/2022   Genetic testing 02/10/2022   Bilateral breast cancer (HCC) 08/01/2021   History of bilateral mastectomy    Cellulitis of left upper extremity 06/22/2021   Bacteremia 06/22/2021   Fever, unspecified 06/22/2021   Infection due to Port-A-Cath 06/22/2021   Hypertension    History of breast cancer    High cholesterol    Immunocompromised    Cellulitis 06/21/2021   Non-alcoholic fatty liver disease 01/31/2021   Vertigo 06/10/2020    PCP: Alston Silvio BROCKS, FNP  REFERRING PROVIDER: Gershon Donnice SAUNDERS, DPM  REFERRING DIAG: M21.619 (ICD-10-CM) - Bunion R26.81 (ICD-10-CM) - Gait instability  THERAPY DIAG:  Stiffness of right ankle, not elsewhere classified  Stiffness of right foot, not elsewhere  classified  Pain in right toe(s)  Pain in right ankle and joints of right foot  Difficulty in walking, not elsewhere classified  Localized edema  Rationale for Evaluation and Treatment: Rehabilitation  ONSET DATE: 09/10/24  SUBJECTIVE:   SUBJECTIVE STATEMENT: Pt states she has started to not use her scooter now. Still using cam boot -- was not given any instructions on when she can take it off. Will take boot off when she's not walking at home  PERTINENT HISTORY: Cancer, high blood pressure  PAIN:  Are you having pain? No  PRECAUTIONS: Fall No blood pressure on both arms or heavy lifting  RED FLAGS: None   WEIGHT BEARING RESTRICTIONS: clarifying with Dr. Gershon  FALLS:  Has patient fallen in last 6 months? Yes. 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  DIAGNOSTIC FINDINGS:   PATIENT SURVEYS:  LEFS  Extreme difficulty/unable (0), Quite a bit of difficulty (1), Moderate difficulty (2), Little difficulty (3),  No difficulty (4) Survey date:  11/04/24  Any of your usual work, housework or school activities 1  2. Usual hobbies, recreational or sporting activities 1  3. Getting into/out of the bath 1  4. Walking between rooms 1  5. Putting on socks/shoes 1  6. Squatting  1  7. Lifting an object, like a bag of groceries from the floor 1  8. Performing light activities around your home 3  9. Performing heavy activities around your home 0  10. Getting into/out of a car 1  11. Walking 2 blocks 0  12.  Walking 1 mile 0  13. Going up/down 10 stairs (1 flight) 0  14. Standing for 1 hour 0  15.  sitting for 1 hour 4  16. Running on even ground 0  17. Running on uneven ground 0  18. Making sharp turns while running fast 0  19. Hopping  0  20. Rolling over in bed 1  Score total:  16/80     COGNITION: Overall cognitive status: Within functional limits for tasks assessed     SENSATION: WFL  EDEMA:  Mild edema R vs L foot around forefoot  MUSCLE LENGTH: Did not assess  POSTURE: No Significant postural limitations  PALPATION: No overt tenderness to palpation  LOWER EXTREMITY ROM:  Active ROM Right eval Left eval  Hip flexion    Hip extension    Hip abduction    Hip adduction    Hip internal rotation    Hip external rotation    Knee flexion    Knee extension    Ankle dorsiflexion 5 11  Ankle plantarflexion 42 65  Ankle inversion 40 40  Ankle eversion 35 35   (Blank rows = not tested)  LOWER EXTREMITY MMT:  MMT Right eval Left eval  Hip flexion 5 5  Hip extension 3+ 3+  Hip abduction 3+ 4-  Hip adduction    Hip internal rotation 3+ 3+  Hip external rotation 3+ 3+  Knee flexion 5 5  Knee extension 5 5  Ankle dorsiflexion    Ankle plantarflexion    Ankle inversion    Ankle eversion     (Blank rows = not tested)  LOWER EXTREMITY SPECIAL TESTS:  Did not assess  FUNCTIONAL TESTS:  Tandem stance: R LE back >1 min SLS: pt fearful to attempt  GAIT: Distance walked: Into clinic Assistive device utilized: cam boot on R Level of assistance: Complete Independence Comments: Mildly antalgic, R LE externally rotated, reduced step length on L                                                                                                                                TREATMENT DATE: 11/04/24 See HEP below    PATIENT EDUCATION:  Education details: Exam findings, POC, initial HEP Person educated: Patient Education method: Explanation, Demonstration, and  Handouts Education comprehension: verbalized understanding, returned demonstration, and needs further education  HOME EXERCISE PROGRAM: Access Code: [email or web address] URL: [email or web address] Date: 11/04/2024 Prepared by: Kishan Wachsmuth April Marie Alanda Colton  Exercises - Seated Heel Slide  - 2-3 x daily - 7 x weekly - 1 sets - 10 reps - Seated Toe Towel Scrunches  - 2-3 x daily - 7 x weekly - 1 sets - 10 reps - Toe Spreading  - 2-3 x daily - 7 x weekly - 1 sets - 10 reps - Seated Heel Raise  - 2-3 x daily - 7 x weekly - 1 sets - 10 reps - Arch Lifts  - 2-3 x daily - 7 x weekly - 1 sets - 10 reps  ASSESSMENT:  CLINICAL IMPRESSION: Patient is a 53 y.o. F who was seen today for physical therapy evaluation and treatment s/p bunionectomy and 2nd digit hammertoe repair on 09/10/24. Assessment is significant for reduced R ankle and toe ROM and strength affecting tolerance to standing and weight bearing activities. Pt will benefit from PT to address these issues to return to PLOF. Plans to get L foot surgery at a later date  OBJECTIVE IMPAIRMENTS: Abnormal gait, decreased activity tolerance, decreased balance, decreased coordination, decreased endurance, decreased mobility, difficulty walking, decreased ROM, decreased strength, hypomobility, increased edema, increased fascial restrictions, impaired flexibility, improper body mechanics, and postural dysfunction.   ACTIVITY LIMITATIONS: carrying, lifting, standing, squatting, stairs, transfers, bathing, toileting, dressing, hygiene/grooming, and locomotion level  PARTICIPATION LIMITATIONS: meal prep, cleaning, laundry, driving, shopping, community activity, and yard work  PERSONAL FACTORS: Age, Fitness, Past/current experiences, and Time since onset of injury/illness/exacerbation are also affecting patient's functional outcome.   REHAB POTENTIAL: Good  CLINICAL DECISION MAKING: Evolving/moderate complexity  EVALUATION COMPLEXITY:  Moderate   GOALS: Goals reviewed with patient? Yes  SHORT TERM GOALS: Target date: 12/02/2024  Pt will be ind with initial HEP Baseline: Goal status: INITIAL  2.  Pt will demo R = L ankle DF for amb Baseline:  Goal status: INITIAL  3.  Pt will be able to perform bilat heel raise x10 to demo increasing ankle strength Baseline:  Goal status: INITIAL    LONG TERM GOALS: Target date: 12/30/2024   Pt will be ind with management and progression of HEP Baseline:  Goal status: INITIAL  2.  Pt will be able to perform SLS x 10 sec on R LE to demo increased foot/ankle stability Baseline:  Goal status: INITIAL  3.  Pt will be able to amb with normal reciprocal pattern x1000' for community amb Baseline:  Goal status: INITIAL  4.  Pt will have improved LEFS to >/=32/80 Baseline:  Goal status: INITIAL  5.  Pt will be able to perform single leg heel raise to demo normal return of ankle strength Baseline:  Goal status: INITIAL    PLAN:  PT FREQUENCY: 1-2x/week  PT DURATION: 8 weeks  PLANNED INTERVENTIONS: 97164- PT Re-evaluation, 97750- Physical Performance Testing, 97110-Therapeutic exercises, 97530- Therapeutic activity, [id number]- Neuromuscular re-education, 97535- Self Care, 02859- Manual therapy, [id number]- Gait training, [phone]- Electrical stimulation (unattended), 97016- Vasopneumatic device, [id number]- Ultrasound, [id number]- Ionotophoresis 4mg /ml Dexamethasone, 79439 (1-2 muscles), 20561 (3+ muscles)- Dry Needling, Patient/Family education, Balance training, Taping, Joint mobilization, Spinal mobilization, Scar mobilization, Compression bandaging, Cryotherapy, and Moist heat  PLAN FOR NEXT SESSION: Assess response to HEP. Work on ankle/toe mobility and strengthening. Intrinsic foot strengthening.    Taheera Thomann April Ma L Trudee Chirino, PT, DPT 11/04/2024, 12:20 PM

## 2024-11-11 ENCOUNTER — Ambulatory Visit: Admitting: Physical Therapy

## 2024-11-14 ENCOUNTER — Ambulatory Visit: Admitting: Podiatry

## 2024-11-14 ENCOUNTER — Ambulatory Visit

## 2024-11-14 VITALS — Ht 64.0 in | Wt 163.0 lb

## 2024-11-14 DIAGNOSIS — M21611 Bunion of right foot: Secondary | ICD-10-CM | POA: Diagnosis not present

## 2024-11-14 DIAGNOSIS — M21619 Bunion of unspecified foot: Secondary | ICD-10-CM

## 2024-11-14 DIAGNOSIS — M2042 Other hammer toe(s) (acquired), left foot: Secondary | ICD-10-CM

## 2024-11-14 DIAGNOSIS — M2041 Other hammer toe(s) (acquired), right foot: Secondary | ICD-10-CM

## 2024-11-14 NOTE — Progress Notes (Signed)
 Subjective: Chief Complaint  Patient presents with   Post-op Follow-up    Rm 12 Patient is here for post-op follow-up.     53 year old female presents the office today above concerns.  She states that she is doing well she is not having any pain at this time.  She has been in the cam boot and still doing physical therapy.  No significant pain.  No fevers or chills.  No other concerns.  Objective: AAO x3, NAD-presents with daughter DP/PT pulses palpable bilaterally, CRT less than 3 seconds Incisions healing well.  There is no evidence of dehiscence.  Does have cellulitis.  There is still some mild but improved edema present at surgical sites.  There is no signs of infection.  No significant pain on exam today. No pain with calf compression, swelling, warmth, erythema  Assessment: Status post right foot surgery  Plan: -All treatment options discussed with the patient including all alternatives, risks, complications.  -X-rays obtained reviewed.  Multiple views obtained.  Hardware intact uncomplicated factors.  Increased consolidation noted. -She is to transition to regular shoe as tolerated.  Continue range of motion exercises.  Continue physical therapy. -Ice, elevation as well as compression to help any residual edema  Return in about 4 weeks (around 12/12/2024) for post-op, x-ray.  Sharon Phillips Fees DPM

## 2024-11-18 ENCOUNTER — Other Ambulatory Visit (HOSPITAL_COMMUNITY): Payer: Self-pay

## 2024-11-18 ENCOUNTER — Encounter: Payer: Self-pay | Admitting: Physical Therapy

## 2024-11-18 ENCOUNTER — Ambulatory Visit: Admitting: Physical Therapy

## 2024-11-18 ENCOUNTER — Telehealth: Payer: Self-pay | Admitting: Pharmacy Technician

## 2024-11-18 DIAGNOSIS — M25671 Stiffness of right ankle, not elsewhere classified: Secondary | ICD-10-CM | POA: Diagnosis not present

## 2024-11-18 DIAGNOSIS — M25674 Stiffness of right foot, not elsewhere classified: Secondary | ICD-10-CM

## 2024-11-18 DIAGNOSIS — M79674 Pain in right toe(s): Secondary | ICD-10-CM

## 2024-11-18 DIAGNOSIS — R262 Difficulty in walking, not elsewhere classified: Secondary | ICD-10-CM

## 2024-11-18 DIAGNOSIS — R600 Localized edema: Secondary | ICD-10-CM

## 2024-11-18 DIAGNOSIS — M25571 Pain in right ankle and joints of right foot: Secondary | ICD-10-CM

## 2024-11-18 NOTE — Telephone Encounter (Signed)
 Pharmacy Patient Advocate Encounter  Received notification from [facility] that Prior Authorization for NURTEC 75MG  has been APPROVED from 11.16.25 to 12.16.26. Unable to obtain price due to refill too soon rejection, last fill date 11.25.25 next available fill date12.17.25   PA #/Case ID/Reference #: [id number]

## 2024-11-18 NOTE — Telephone Encounter (Signed)
 Pharmacy Patient Advocate Encounter   Received notification from Patient Pharmacy that prior authorization for NURTEC 75MG  is required/requested.   Insurance verification completed.   The patient is insured through [employer].   Per test claim: PA required; PA submitted to above mentioned insurance via [facility] Key/confirmation #/EOC [id number] P Status is pending

## 2024-11-18 NOTE — Therapy (Signed)
 OUTPATIENT PHYSICAL THERAPY TREATMENT   Patient Name: Sharon Phillips MRN: [id number] DOB:1971-01-24, 53 y.o., female Today's Date: 11/18/2024  END OF SESSION:  PT End of Session - 11/18/24 0811     Visit Number 2    Number of Visits 16    Date for Recertification  12/30/24    Authorization Type Medicare and Medicaid    PT Start Time 0806    PT Stop Time 0845    PT Time Calculation (min) 39 min    Activity Tolerance Patient tolerated treatment well           Past Medical History:  Diagnosis Date   Anxiety    Cancer (HCC)    Depression    High cholesterol    Hypertension    Past Surgical History:  Procedure Laterality Date   ABDOMINAL HYSTERECTOMY     CENTRAL VENOUS CATHETER INSERTION Right 06/27/2021   Procedure: INSERTION CENTRAL LINE ADULT, tunneled;  Surgeon: Kallie Manuelita BROCKS, MD;  Location: [facility];  Service: General;  Laterality: Right;   MASTECTOMY     bilateral   Patient Active Problem List   Diagnosis Date Noted   Atypical chest pain 01/08/2024   Chronic migraine without aura, not intractable, without status migrainosus 02/15/2022   Anxiety 02/15/2022   Mixed anxiety depressive disorder 02/15/2022   Monoallelic mutation of PALB2 gene 02/10/2022   Genetic testing 02/10/2022   Bilateral breast cancer (HCC) 08/01/2021   History of bilateral mastectomy    Cellulitis of left upper extremity 06/22/2021   Bacteremia 06/22/2021   Fever, unspecified 06/22/2021   Infection due to Port-A-Cath 06/22/2021   Hypertension    History of breast cancer    High cholesterol    Immunocompromised    Cellulitis 06/21/2021   Non-alcoholic fatty liver disease 01/31/2021   Vertigo 06/10/2020    PCP: Alston Silvio BROCKS, FNP  REFERRING PROVIDER: Gershon Donnice SAUNDERS, DPM  REFERRING DIAG: M21.619 (ICD-10-CM) - Bunion R26.81 (ICD-10-CM) - Gait instability  THERAPY DIAG:  Stiffness of right ankle, not elsewhere classified  Stiffness of right foot, not elsewhere  classified  Pain in right toe(s)  Pain in right ankle and joints of right foot  Difficulty in walking, not elsewhere classified  Localized edema  Rationale for Evaluation and Treatment: Rehabilitation  ONSET DATE: 09/10/24  SUBJECTIVE:   SUBJECTIVE STATEMENT: Pt states her foot/ankle is feeling better. Has been cleared to be out of the boot around the house as tolerated.   PERTINENT HISTORY: Cancer, high blood pressure  PAIN:  Are you having pain? No  PRECAUTIONS: Fall No blood pressure on both arms or heavy lifting  RED FLAGS: None   WEIGHT BEARING RESTRICTIONS: clarifying with Dr. Gershon  FALLS:  Has patient fallen in last 6 months? Yes. 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  DIAGNOSTIC FINDINGS:   PATIENT SURVEYS:  LEFS  Extreme difficulty/unable (0), Quite a bit of difficulty (1), Moderate difficulty (2), Little difficulty (3), No difficulty (4) Survey date:  11/04/24  Any of your usual work, housework or school activities  1  2. Usual hobbies, recreational or sporting activities 1  3. Getting into/out of the bath 1  4. Walking between rooms 1  5. Putting on socks/shoes 1  6. Squatting  1  7. Lifting an object, like a bag of groceries from the floor 1  8. Performing light activities around your home 3  9. Performing heavy activities around your home 0  10. Getting into/out of a car 1  11. Walking 2 blocks 0  12. Walking 1 mile 0  13. Going up/down 10 stairs (1 flight) 0  14. Standing for  1 hour 0  15.  sitting for 1 hour 4  16. Running on even ground 0  17. Running on uneven ground 0  18. Making sharp turns while running fast 0  19. Hopping  0  20. Rolling over in bed 1  Score total:  16/80     COGNITION: Overall cognitive status: Within functional limits for tasks assessed     SENSATION: WFL  EDEMA:  Mild edema R vs L foot around forefoot  MUSCLE LENGTH: Did not assess  POSTURE: No Significant postural limitations  PALPATION: No overt tenderness to palpation  LOWER EXTREMITY ROM:  Active ROM Right eval Left eval  Hip flexion    Hip extension    Hip abduction    Hip adduction    Hip internal rotation    Hip external rotation    Knee flexion    Knee extension    Ankle dorsiflexion 5 11  Ankle plantarflexion 42 65  Ankle inversion 40 40  Ankle eversion 35 35   (Blank rows = not tested)  LOWER EXTREMITY MMT:  MMT Right eval Left eval  Hip flexion 5 5  Hip extension 3+ 3+  Hip abduction 3+ 4-  Hip adduction    Hip internal rotation 3+ 3+  Hip external rotation 3+ 3+  Knee flexion 5 5  Knee extension 5 5  Ankle dorsiflexion    Ankle plantarflexion    Ankle inversion    Ankle eversion     (Blank rows = not tested)  LOWER EXTREMITY SPECIAL TESTS:  Did not assess  FUNCTIONAL TESTS:  Tandem stance: R LE back >1 min SLS: pt fearful to attempt  GAIT: Distance walked: Into clinic Assistive device utilized: cam boot on R Level of assistance: Complete Independence Comments: Mildly antalgic, R LE externally rotated, reduced step length on L                                                                                                                                TREATMENT DATE:  11/18/24 Nustep L3 x10 min UEs/LEs Sitting gastroc stretch with strap x30 Sitting soleus stretch with strap x30 Sitting rockerboard DF/PF 2x30 Sitting rockerboard L<>R 2x30 Sitting heel raise 2x10 Sitting toe splaying 2x10 Sitting toe flexion  2x10 Sitting toe extension 2x10 Sitting ankle inv/ev 2x10 Sitting arch lift 2x10 Sitting big toe  abduction 2x10 Self care: self scar massage, self gentle toe mobs PA/APs  11/04/24 See HEP below    PATIENT EDUCATION:  Education details: HEP updates Person educated: Patient Education method: Explanation, Demonstration, and Handouts Education comprehension: verbalized understanding, returned demonstration, and needs further education  HOME EXERCISE PROGRAM: Access Code: [email or web address] URL: [email or web address] Date: 11/04/2024 Prepared by: Russie Gulledge April Marie Greysen Devino  Exercises - Seated Heel Slide  - 2-3 x daily - 7 x weekly - 1 sets - 10 reps - Seated Toe Towel Scrunches  - 2-3 x daily - 7 x weekly - 1 sets - 10 reps - Toe Spreading  - 2-3 x daily - 7 x weekly - 1 sets - 10 reps - Seated Heel Raise  - 2-3 x daily - 7 x weekly - 1 sets - 10 reps - Arch Lifts  - 2-3 x daily - 7 x weekly - 1 sets - 10 reps  ASSESSMENT:  CLINICAL IMPRESSION: 11/18/2024 Treatment focused on continuing toe/ankle strengthening. Working on improving posterior tib firing to support medial ankle. Initiated standing and pregait exercises.   From eval: Patient is a 53 y.o. F who was seen today for physical therapy evaluation and treatment s/p bunionectomy and 2nd digit hammertoe repair on 09/10/24. Assessment is significant for reduced R ankle and toe ROM and strength affecting tolerance to standing and weight bearing activities. Pt will benefit from PT to address these issues to return to PLOF. Plans to get L foot surgery at a later date  OBJECTIVE IMPAIRMENTS: Abnormal gait, decreased activity tolerance, decreased balance, decreased coordination, decreased endurance, decreased mobility, difficulty walking, decreased ROM, decreased strength, hypomobility, increased edema, increased fascial restrictions, impaired flexibility, improper body mechanics, and postural dysfunction.   ACTIVITY LIMITATIONS:  carrying, lifting, standing, squatting, stairs, transfers, bathing, toileting, dressing, hygiene/grooming, and locomotion level  PARTICIPATION LIMITATIONS: meal prep, cleaning, laundry, driving, shopping, community activity, and yard work  PERSONAL FACTORS: Age, Fitness, Past/current experiences, and Time since onset of injury/illness/exacerbation are also affecting patient's functional outcome.   REHAB POTENTIAL: Good  CLINICAL DECISION MAKING: Evolving/moderate complexity  EVALUATION COMPLEXITY: Moderate   GOALS: Goals reviewed with patient? Yes  SHORT TERM GOALS: Target date: 12/02/2024  Pt will be ind with initial HEP Baseline: Goal status: INITIAL  2.  Pt will demo R = L ankle DF for amb Baseline:  Goal status: INITIAL  3.  Pt will be able to perform bilat heel raise x10 to demo increasing ankle strength Baseline:  Goal status: INITIAL    LONG TERM GOALS: Target date: 12/30/2024   Pt will be ind with management and progression of HEP Baseline:  Goal status: INITIAL  2.  Pt will be able to perform SLS x 10 sec on R LE to demo increased foot/ankle stability Baseline:  Goal status: INITIAL  3.  Pt will be able to amb with normal reciprocal pattern x1000' for community amb Baseline:  Goal status: INITIAL  4.  Pt will have improved LEFS to >/=32/80 Baseline:  Goal status: INITIAL  5.  Pt will be able to perform single leg heel raise to demo normal return of ankle strength Baseline:  Goal status: INITIAL    PLAN:  PT FREQUENCY: 1-2x/week  PT DURATION: 8 weeks  PLANNED INTERVENTIONS: 97164- PT Re-evaluation, 97750- Physical Performance Testing, 97110-Therapeutic exercises, 97530- Therapeutic activity, [id number]- Neuromuscular re-education, 97535- Self Care, 02859- Manual therapy, [id number]- Gait training, H9716- Electrical stimulation (unattended), 97016- Vasopneumatic device, [id number]- Ultrasound, [id number]- Ionotophoresis 4mg /ml Dexamethasone, 79439 (1-2 muscles), 79438  (  3+ muscles)- Dry Needling, Patient/Family education, Balance training, Taping, Joint mobilization, Spinal mobilization, Scar mobilization, Compression bandaging, Cryotherapy, and Moist heat  PLAN FOR NEXT SESSION: Assess response to HEP. Work on ankle/toe mobility and strengthening. Intrinsic foot strengthening.    Cherolyn Behrle April Ma L Chaya Dehaan, PT, DPT 11/18/2024, 8:11 AM

## 2024-11-24 ENCOUNTER — Ambulatory Visit: Admitting: Physical Therapy

## 2024-11-24 DIAGNOSIS — M25674 Stiffness of right foot, not elsewhere classified: Secondary | ICD-10-CM

## 2024-11-24 DIAGNOSIS — M25571 Pain in right ankle and joints of right foot: Secondary | ICD-10-CM

## 2024-11-24 DIAGNOSIS — M25671 Stiffness of right ankle, not elsewhere classified: Secondary | ICD-10-CM | POA: Diagnosis not present

## 2024-11-24 DIAGNOSIS — M79674 Pain in right toe(s): Secondary | ICD-10-CM

## 2024-11-24 DIAGNOSIS — R262 Difficulty in walking, not elsewhere classified: Secondary | ICD-10-CM

## 2024-11-24 DIAGNOSIS — R600 Localized edema: Secondary | ICD-10-CM

## 2024-11-24 NOTE — Therapy (Signed)
 " OUTPATIENT PHYSICAL THERAPY TREATMENT   Patient Name: Sharon Phillips MRN: [id number] DOB:[birth year], 53 y.o., female Today's Date: 11/24/2024  END OF SESSION:  PT End of Session - 11/24/24 1010     Visit Number 3    Number of Visits 16    Date for Recertification  12/30/24    Authorization Type Medicare and Medicaid    PT Start Time 1017    PT Stop Time 1055    PT Time Calculation (min) 38 min    Activity Tolerance Patient tolerated treatment well            Past Medical History:  Diagnosis Date   Anxiety    Cancer (HCC)    Depression    High cholesterol    Hypertension    Past Surgical History:  Procedure Laterality Date   ABDOMINAL HYSTERECTOMY     CENTRAL VENOUS CATHETER INSERTION Right 06/27/2021   Procedure: INSERTION CENTRAL LINE ADULT, tunneled;  Surgeon: Kallie Manuelita BROCKS, MD;  Location: [facility];  Service: General;  Laterality: Right;   MASTECTOMY     bilateral   Patient Active Problem List   Diagnosis Date Noted   Atypical chest pain 01/08/2024   Chronic migraine without aura, not intractable, without status migrainosus 02/15/2022   Anxiety 02/15/2022   Mixed anxiety depressive disorder 02/15/2022   Monoallelic mutation of PALB2 gene 02/10/2022   Genetic testing 02/10/2022   Bilateral breast cancer (HCC) 08/01/2021   History of bilateral mastectomy    Cellulitis of left upper extremity 06/22/2021   Bacteremia 06/22/2021   Fever, unspecified 06/22/2021   Infection due to Port-A-Cath 06/22/2021   Hypertension    History of breast cancer    High cholesterol    Immunocompromised    Cellulitis 06/21/2021   Non-alcoholic fatty liver disease 01/31/2021   Vertigo 06/10/2020    PCP: Alston Silvio BROCKS, FNP  REFERRING PROVIDER: Gershon Donnice SAUNDERS, DPM  REFERRING DIAG: M21.619 (ICD-10-CM) - Bunion R26.81 (ICD-10-CM) - Gait instability  THERAPY DIAG:  Stiffness of right ankle, not elsewhere classified  Stiffness of right foot, not elsewhere  classified  Pain in right toe(s)  Pain in right ankle and joints of right foot  Difficulty in walking, not elsewhere classified  Localized edema  Rationale for Evaluation and Treatment: Rehabilitation  ONSET DATE: 09/10/24  SUBJECTIVE:   SUBJECTIVE STATEMENT: Has been compliant with her exercises. States every day it's been getting better and better. Going almost all day without the boot when she's in the house.   PERTINENT HISTORY: Cancer, high blood pressure  PAIN:  Are you having pain? No  PRECAUTIONS: Fall No blood pressure on both arms or heavy lifting  RED FLAGS: None   WEIGHT BEARING RESTRICTIONS: WBAT  FALLS:  Has patient fallen in last 6 months? Yes. 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  DIAGNOSTIC FINDINGS:   PATIENT SURVEYS:  LEFS  Extreme difficulty/unable (0), Quite a bit of difficulty (1), Moderate difficulty (2), Little difficulty (3), No difficulty (4) Survey date:  11/04/24  Any of your usual  work, housework or school activities 1  2. Usual hobbies, recreational or sporting activities 1  3. Getting into/out of the bath 1  4. Walking between rooms 1  5. Putting on socks/shoes 1  6. Squatting  1  7. Lifting an object, like a bag of groceries from the floor 1  8. Performing light activities around your home 3  9. Performing heavy activities around your home 0  10. Getting into/out of a car 1  11. Walking 2 blocks 0  12. Walking 1 mile 0  13. Going up/down 10 stairs (1 flight) 0   14. Standing for 1 hour 0  15.  sitting for 1 hour 4  16. Running on even ground 0  17. Running on uneven ground 0  18. Making sharp turns while running fast 0  19. Hopping  0  20. Rolling over in bed 1  Score total:  16/80     COGNITION: Overall cognitive status: Within functional limits for tasks assessed     SENSATION: WFL  EDEMA:  Mild edema R vs L foot around forefoot  MUSCLE LENGTH: Did not assess  POSTURE: No Significant postural limitations  PALPATION: No overt tenderness to palpation  LOWER EXTREMITY ROM:  Active ROM Right eval Left eval  Hip flexion    Hip extension    Hip abduction    Hip adduction    Hip internal rotation    Hip external rotation    Knee flexion    Knee extension    Ankle dorsiflexion 5 11  Ankle plantarflexion 42 65  Ankle inversion 40 40  Ankle eversion 35 35   (Blank rows = not tested)  LOWER EXTREMITY MMT:  MMT Right eval Left eval  Hip flexion 5 5  Hip extension 3+ 3+  Hip abduction 3+ 4-  Hip adduction    Hip internal rotation 3+ 3+  Hip external rotation 3+ 3+  Knee flexion 5 5  Knee extension 5 5  Ankle dorsiflexion    Ankle plantarflexion    Ankle inversion    Ankle eversion     (Blank rows = not tested)  LOWER EXTREMITY SPECIAL TESTS:  Did not assess  FUNCTIONAL TESTS:  Tandem stance: R LE back >1 min SLS: pt fearful to attempt  GAIT: Distance walked: Into clinic Assistive device utilized: cam boot on R Level of assistance: Complete Independence Comments: Mildly antalgic, R LE externally rotated, reduced step length on L                                                                                                                                TREATMENT DATE:  11/24/24 Nustep L6 x 10 min UEs/LEs Standing gastroc stretch 2x 30 Standing soleus stretch with strap 2x 30 Sitting ankle 3 ways red TB 2x10 each Standing alternating heel raise 2x10 Sitting arch lift 2x10 Sitting toe splaying  2x10 Sitting marble pick up 2x10 Sitting toe abd  2x10 Standing toe off 2x10 Standing tandem stance 2x30  11/18/24 Nustep L3 x10 min UEs/LEs Sitting gastroc stretch with strap x30 Sitting soleus stretch with strap x30 Sitting rockerboard DF/PF 2x30 Sitting rockerboard L<>R 2x30 Sitting heel raise 2x10 Sitting toe splaying 2x10 Sitting toe flexion 2x10 Sitting toe extension 2x10 Sitting ankle inv/ev 2x10 Sitting arch lift 2x10 Sitting big toe abduction 2x10 Self care: self scar massage, self gentle toe mobs PA/APs  11/04/24 See HEP below    PATIENT EDUCATION:  Education details: HEP updates Person educated: Patient Education method: Explanation, Demonstration, and Handouts Education comprehension: verbalized understanding, returned demonstration, and needs further education  HOME EXERCISE PROGRAM: Access Code: [email or web address] URL: [email or web address] Date: 11/04/2024 Prepared by: Lily Kernen April Marie Jeven Topper  Exercises - Seated Heel Slide  - 2-3 x daily - 7 x weekly - 1 sets - 10 reps - Seated Toe Towel Scrunches  - 2-3 x daily - 7 x weekly - 1 sets - 10 reps - Toe Spreading  - 2-3 x daily - 7 x weekly - 1 sets - 10 reps - Seated Heel Raise  - 2-3 x daily - 7 x weekly - 1 sets - 10 reps - Arch Lifts  - 2-3 x daily - 7 x weekly - 1 sets - 10 reps  ASSESSMENT:  CLINICAL IMPRESSION: 11/24/2024 Progressed strengthening. Able to tolerate red TB for ankle strengthening. More exercises performed in standing now due to increased tolerance.   From eval: Patient is a 53 y.o. F who was seen today for physical therapy evaluation and treatment s/p bunionectomy and 2nd digit hammertoe repair on 09/10/24. Assessment is significant for reduced R ankle and toe ROM and strength affecting tolerance to standing and weight bearing activities. Pt will benefit from PT to address these issues to return to PLOF. Plans to get L foot surgery at a later date  OBJECTIVE IMPAIRMENTS:  Abnormal gait, decreased activity tolerance, decreased balance, decreased coordination, decreased endurance, decreased mobility, difficulty walking, decreased ROM, decreased strength, hypomobility, increased edema, increased fascial restrictions, impaired flexibility, improper body mechanics, and postural dysfunction.   ACTIVITY LIMITATIONS: carrying, lifting, standing, squatting, stairs, transfers, bathing, toileting, dressing, hygiene/grooming, and locomotion level  PARTICIPATION LIMITATIONS: meal prep, cleaning, laundry, driving, shopping, community activity, and yard work  PERSONAL FACTORS: Age, Fitness, Past/current experiences, and Time since onset of injury/illness/exacerbation are also affecting patient's functional outcome.   REHAB POTENTIAL: Good  CLINICAL DECISION MAKING: Evolving/moderate complexity  EVALUATION COMPLEXITY: Moderate   GOALS: Goals reviewed with patient? Yes  SHORT TERM GOALS: Target date: 12/02/2024  Pt will be ind with initial HEP Baseline: Goal status: INITIAL  2.  Pt will demo R = L ankle DF for amb Baseline:  Goal status: INITIAL  3.  Pt will be able to perform bilat heel raise x10 to demo increasing ankle strength Baseline:  Goal status: INITIAL    LONG TERM GOALS: Target date: 12/30/2024   Pt will be ind with management and progression of HEP Baseline:  Goal status: INITIAL  2.  Pt will be able to perform SLS x 10 sec on R LE to demo increased foot/ankle stability Baseline:  Goal status: INITIAL  3.  Pt will be able to amb with normal reciprocal pattern x1000' for community amb Baseline:  Goal status: INITIAL  4.  Pt will have improved LEFS to >/=32/80 Baseline:  Goal status: INITIAL  5.  Pt will be able to perform single leg heel raise to demo normal return of ankle strength Baseline:  Goal status: INITIAL    PLAN:  PT FREQUENCY: 1-2x/week  PT DURATION: 8 weeks  PLANNED INTERVENTIONS: 97164- PT Re-evaluation, 97750-  Physical Performance Testing, 97110-Therapeutic exercises, 97530- Therapeutic activity, [id number]- Neuromuscular re-education, 97535- Self Care, 02859- Manual therapy, [phone]- Gait training, [phone]- Electrical stimulation (unattended), 97016- Vasopneumatic device, [id number]- Ultrasound, [id number]- Ionotophoresis 4mg /ml Dexamethasone, 79439 (1-2 muscles), 20561 (3+ muscles)- Dry Needling, Patient/Family education, Balance training, Taping, Joint mobilization, Spinal mobilization, Scar mobilization, Compression bandaging, Cryotherapy, and Moist heat  PLAN FOR NEXT SESSION: Assess response to HEP. Work on ankle/toe mobility and strengthening. Intrinsic foot strengthening.    Quina Wilbourne April Ma L Aariona Momon, PT, DPT 11/24/2024, 11:52 AM  "

## 2024-11-28 ENCOUNTER — Other Ambulatory Visit: Payer: Self-pay | Admitting: Neurology

## 2024-12-01 ENCOUNTER — Encounter: Payer: Self-pay | Admitting: *Deleted

## 2024-12-02 ENCOUNTER — Ambulatory Visit

## 2024-12-02 DIAGNOSIS — M25671 Stiffness of right ankle, not elsewhere classified: Secondary | ICD-10-CM | POA: Diagnosis not present

## 2024-12-02 DIAGNOSIS — R262 Difficulty in walking, not elsewhere classified: Secondary | ICD-10-CM

## 2024-12-02 DIAGNOSIS — M25571 Pain in right ankle and joints of right foot: Secondary | ICD-10-CM

## 2024-12-02 DIAGNOSIS — M25674 Stiffness of right foot, not elsewhere classified: Secondary | ICD-10-CM

## 2024-12-02 DIAGNOSIS — R600 Localized edema: Secondary | ICD-10-CM

## 2024-12-02 DIAGNOSIS — M79674 Pain in right toe(s): Secondary | ICD-10-CM

## 2024-12-02 NOTE — Therapy (Signed)
 " OUTPATIENT PHYSICAL THERAPY TREATMENT   Patient Name: Sharon Phillips MRN: [id number] DOB:July 29, 1971, 53 y.o., female Today's Date: 12/02/2024  END OF SESSION:  PT End of Session - 12/02/24 0941     Visit Number 4    Number of Visits 16    Date for Recertification  12/30/24    Authorization Type Medicare and Medicaid    PT Start Time 0940    PT Stop Time 1016    PT Time Calculation (min) 36 min    Activity Tolerance Patient tolerated treatment well            Past Medical History:  Diagnosis Date   Anxiety    Cancer (HCC)    Depression    High cholesterol    Hypertension    Past Surgical History:  Procedure Laterality Date   ABDOMINAL HYSTERECTOMY     CENTRAL VENOUS CATHETER INSERTION Right 06/27/2021   Procedure: INSERTION CENTRAL LINE ADULT, tunneled;  Surgeon: Kallie Manuelita BROCKS, MD;  Location: [facility];  Service: General;  Laterality: Right;   MASTECTOMY     bilateral   Patient Active Problem List   Diagnosis Date Noted   Atypical chest pain 01/08/2024   Chronic migraine without aura, not intractable, without status migrainosus 02/15/2022   Anxiety 02/15/2022   Mixed anxiety depressive disorder 02/15/2022   Monoallelic mutation of PALB2 gene 02/10/2022   Genetic testing 02/10/2022   Bilateral breast cancer (HCC) 08/01/2021   History of bilateral mastectomy    Cellulitis of left upper extremity 06/22/2021   Bacteremia 06/22/2021   Fever, unspecified 06/22/2021   Infection due to Port-A-Cath 06/22/2021   Hypertension    History of breast cancer    High cholesterol    Immunocompromised    Cellulitis 06/21/2021   Non-alcoholic fatty liver disease 01/31/2021   Vertigo 06/10/2020    PCP: Alston Silvio BROCKS, FNP  REFERRING PROVIDER: Gershon Donnice SAUNDERS, DPM  REFERRING DIAG: M21.619 (ICD-10-CM) - Bunion R26.81 (ICD-10-CM) - Gait instability  THERAPY DIAG:  Stiffness of right ankle, not elsewhere classified  Stiffness of right foot, not elsewhere  classified  Pain in right toe(s)  Pain in right ankle and joints of right foot  Localized edema  Difficulty in walking, not elsewhere classified  Rationale for Evaluation and Treatment: Rehabilitation  ONSET DATE: 09/10/24  SUBJECTIVE:   SUBJECTIVE STATEMENT: Pt denies any pain today.  Pt without CAM boot donned today.  Pt drove herself to today's appointment.  PERTINENT HISTORY: Cancer, high blood pressure  PAIN:  Are you having pain? No  PRECAUTIONS: Fall No blood pressure on both arms or heavy lifting  RED FLAGS: None   WEIGHT BEARING RESTRICTIONS: WBAT  FALLS:  Has patient fallen in last 6 months? Yes. 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  DIAGNOSTIC FINDINGS:   PATIENT SURVEYS:  LEFS  Extreme difficulty/unable (0), Quite a bit of difficulty (1), Moderate difficulty (2), Little difficulty (3), No difficulty (4) Survey date:  11/04/24  Any of your usual work, housework or school activities 1  2. Usual  hobbies, recreational or sporting activities 1  3. Getting into/out of the bath 1  4. Walking between rooms 1  5. Putting on socks/shoes 1  6. Squatting  1  7. Lifting an object, like a bag of groceries from the floor 1  8. Performing light activities around your home 3  9. Performing heavy activities around your home 0  10. Getting into/out of a car 1  11. Walking 2 blocks 0  12. Walking 1 mile 0  13. Going up/down 10 stairs (1 flight) 0  14. Standing for 1 hour 0  15.  sitting for 1 hour  4  16. Running on even ground 0  17. Running on uneven ground 0  18. Making sharp turns while running fast 0  19. Hopping  0  20. Rolling over in bed 1  Score total:  16/80     COGNITION: Overall cognitive status: Within functional limits for tasks assessed     SENSATION: WFL  EDEMA:  Mild edema R vs L foot around forefoot  MUSCLE LENGTH: Did not assess  POSTURE: No Significant postural limitations  PALPATION: No overt tenderness to palpation  LOWER EXTREMITY ROM:  Active ROM Right eval Left eval  Hip flexion    Hip extension    Hip abduction    Hip adduction    Hip internal rotation    Hip external rotation    Knee flexion    Knee extension    Ankle dorsiflexion 5 11  Ankle plantarflexion 42 65  Ankle inversion 40 40  Ankle eversion 35 35   (Blank rows = not tested)  LOWER EXTREMITY MMT:  MMT Right eval Left eval  Hip flexion 5 5  Hip extension 3+ 3+  Hip abduction 3+ 4-  Hip adduction    Hip internal rotation 3+ 3+  Hip external rotation 3+ 3+  Knee flexion 5 5  Knee extension 5 5  Ankle dorsiflexion    Ankle plantarflexion    Ankle inversion    Ankle eversion     (Blank rows = not tested)  LOWER EXTREMITY SPECIAL TESTS:  Did not assess  FUNCTIONAL TESTS:  Tandem stance: R LE back >1 min SLS: pt fearful to attempt  GAIT: Distance walked: Into clinic Assistive device utilized: cam boot on R Level of assistance: Complete Independence Comments: Mildly antalgic, R LE externally rotated, reduced step length on L                                                                                                                                TREATMENT DATE:   12/02/24                                  EXERCISE LOG  Exercise Repetitions and Resistance Comments  Nustep Lvl 5 x 15 mins   Rockerboard (standing) 3 mins each way  Gastroc Stretch 30 sec x 3 reps (slant board)   Lunges 14 box x 2 mins   Heel/Toe Raises    Towel Scrunches 2  sets of 15 reps   Marble Pick Ups 20 marbles x 6 reps   Sitting Toe Abd 2 sets of 15 reps   DynaDisc CW and CCW circles x 2 mins each    Blank cell = exercise not performed today   11/24/24 Nustep L6 x 10 min UEs/LEs Standing gastroc stretch 2x 30 Standing soleus stretch with strap 2x 30 Sitting ankle 3 ways red TB 2x10 each Standing alternating heel raise 2x10 Sitting arch lift 2x10 Sitting toe splaying 2x10 Sitting marble pick up 2x10 Sitting toe abd 2x10 Standing toe off 2x10 Standing tandem stance 2x30  11/18/24 Nustep L3 x10 min UEs/LEs Sitting gastroc stretch with strap x30 Sitting soleus stretch with strap x30 Sitting rockerboard DF/PF 2x30 Sitting rockerboard L<>R 2x30 Sitting heel raise 2x10 Sitting toe splaying 2x10 Sitting toe flexion 2x10 Sitting toe extension 2x10 Sitting ankle inv/ev 2x10 Sitting arch lift 2x10 Sitting big toe abduction 2x10 Self care: self scar massage, self gentle toe mobs PA/APs  11/04/24 See HEP below    PATIENT EDUCATION:  Education details: HEP updates Person educated: Patient Education method: Explanation, Demonstration, and Handouts Education comprehension: verbalized understanding, returned demonstration, and needs further education  HOME EXERCISE PROGRAM: Access Code: [email or web address] URL: [email or web address] Date: 11/04/2024 Prepared by: Gellen April Marie Nonato  Exercises - Seated Heel Slide  - 2-3 x daily - 7 x weekly - 1 sets - 10 reps - Seated Toe Towel Scrunches  - 2-3 x daily - 7 x weekly - 1 sets - 10 reps - Toe Spreading  - 2-3 x daily - 7 x weekly - 1 sets - 10 reps - Seated Heel Raise  - 2-3 x daily - 7 x weekly - 1 sets - 10 reps - Arch Lifts  - 2-3 x daily - 7 x weekly - 1 sets - 10 reps  ASSESSMENT:  CLINICAL IMPRESSION: Pt arrives for today's treatment session 9 mins late denying any pain.  Pt reports that she drove herself to today's appointment and ambulates into facility without CAM  boot donned.  Pt states that MD wants her to wear boot when outdoor ambulation.  Pt able to transition to performing rockerboard while standing today with minimal discomfort noted.  Pt introduced to standing lunges and slant board stretch as well.  Pt also introduced to seated dynadisc with min cues to avoid knee movement.  Pt denied any pain at completion of today's treatment session.   OBJECTIVE IMPAIRMENTS: Abnormal gait, decreased activity tolerance, decreased balance, decreased coordination, decreased endurance, decreased mobility, difficulty walking, decreased ROM, decreased strength, hypomobility, increased edema, increased fascial restrictions, impaired flexibility, improper body mechanics, and postural dysfunction.   ACTIVITY LIMITATIONS: carrying, lifting, standing, squatting, stairs, transfers, bathing, toileting, dressing, hygiene/grooming, and locomotion level  PARTICIPATION LIMITATIONS: meal prep, cleaning, laundry, driving, shopping, community activity, and yard work  PERSONAL FACTORS: Age, Fitness, Past/current experiences, and Time since onset of injury/illness/exacerbation are also affecting patient's functional outcome.   REHAB POTENTIAL: Good  CLINICAL DECISION MAKING: Evolving/moderate complexity  EVALUATION COMPLEXITY: Moderate   GOALS: Goals reviewed with patient? Yes  SHORT TERM GOALS: Target date: 12/02/2024  Pt will be ind with initial HEP Baseline: Goal status: INITIAL  2.  Pt will demo R = L ankle DF for amb Baseline:  Goal status: INITIAL  3.  Pt will be able  to perform bilat heel raise x10 to demo increasing ankle strength Baseline:  Goal status: INITIAL    LONG TERM GOALS: Target date: 12/30/2024   Pt will be ind with management and progression of HEP Baseline:  Goal status: INITIAL  2.  Pt will be able to perform SLS x 10 sec on R LE to demo increased foot/ankle stability Baseline:  Goal status: INITIAL  3.  Pt will be able to amb with  normal reciprocal pattern x1000' for community amb Baseline:  Goal status: INITIAL  4.  Pt will have improved LEFS to >/=32/80 Baseline:  Goal status: INITIAL  5.  Pt will be able to perform single leg heel raise to demo normal return of ankle strength Baseline:  Goal status: INITIAL    PLAN:  PT FREQUENCY: 1-2x/week  PT DURATION: 8 weeks  PLANNED INTERVENTIONS: 97164- PT Re-evaluation, 97750- Physical Performance Testing, 97110-Therapeutic exercises, 97530- Therapeutic activity, [id number]- Neuromuscular re-education, 97535- Self Care, 02859- Manual therapy, [id number]- Gait training, [phone]- Electrical stimulation (unattended), 97016- Vasopneumatic device, [id number]- Ultrasound, [id number]- Ionotophoresis 4mg /ml Dexamethasone, 79439 (1-2 muscles), 20561 (3+ muscles)- Dry Needling, Patient/Family education, Balance training, Taping, Joint mobilization, Spinal mobilization, Scar mobilization, Compression bandaging, Cryotherapy, and Moist heat  PLAN FOR NEXT SESSION: Assess response to HEP. Work on ankle/toe mobility and strengthening. Intrinsic foot strengthening.    Delon DELENA Gosling, PTA 12/02/2024, 10:43 AM  "

## 2024-12-09 ENCOUNTER — Other Ambulatory Visit: Payer: Self-pay

## 2024-12-09 ENCOUNTER — Encounter: Payer: Self-pay | Admitting: Physical Therapy

## 2024-12-09 ENCOUNTER — Ambulatory Visit: Attending: Podiatry | Admitting: Physical Therapy

## 2024-12-09 DIAGNOSIS — M25571 Pain in right ankle and joints of right foot: Secondary | ICD-10-CM | POA: Insufficient documentation

## 2024-12-09 DIAGNOSIS — R262 Difficulty in walking, not elsewhere classified: Secondary | ICD-10-CM | POA: Diagnosis present

## 2024-12-09 DIAGNOSIS — M25674 Stiffness of right foot, not elsewhere classified: Secondary | ICD-10-CM | POA: Insufficient documentation

## 2024-12-09 DIAGNOSIS — R600 Localized edema: Secondary | ICD-10-CM | POA: Insufficient documentation

## 2024-12-09 DIAGNOSIS — M79674 Pain in right toe(s): Secondary | ICD-10-CM | POA: Insufficient documentation

## 2024-12-09 DIAGNOSIS — M25671 Stiffness of right ankle, not elsewhere classified: Secondary | ICD-10-CM | POA: Insufficient documentation

## 2024-12-09 NOTE — Therapy (Signed)
 " OUTPATIENT PHYSICAL THERAPY TREATMENT   Patient Name: Sharon Phillips MRN: [id number] DOB:[birth year], 54 y.o., female Today's Date: 12/09/2024  END OF SESSION:  PT End of Session - 12/09/24 0936     Visit Number 5    Number of Visits 16    Date for Recertification  12/30/24    Authorization Type Medicare and Medicaid    PT Start Time 0935    PT Stop Time 1015    PT Time Calculation (min) 40 min    Activity Tolerance Patient tolerated treatment well             Past Medical History:  Diagnosis Date   Anxiety    Cancer (HCC)    Depression    High cholesterol    Hypertension    Past Surgical History:  Procedure Laterality Date   ABDOMINAL HYSTERECTOMY     CENTRAL VENOUS CATHETER INSERTION Right 06/27/2021   Procedure: INSERTION CENTRAL LINE ADULT, tunneled;  Surgeon: Kallie Manuelita BROCKS, MD;  Location: [facility];  Service: General;  Laterality: Right;   MASTECTOMY     bilateral   Patient Active Problem List   Diagnosis Date Noted   Atypical chest pain 01/08/2024   Chronic migraine without aura, not intractable, without status migrainosus 02/15/2022   Anxiety 02/15/2022   Mixed anxiety depressive disorder 02/15/2022   Monoallelic mutation of PALB2 gene 02/10/2022   Genetic testing 02/10/2022   Bilateral breast cancer (HCC) 08/01/2021   History of bilateral mastectomy    Cellulitis of left upper extremity 06/22/2021   Bacteremia 06/22/2021   Fever, unspecified 06/22/2021   Infection due to Port-A-Cath 06/22/2021   Hypertension    History of breast cancer    High cholesterol    Immunocompromised    Cellulitis 06/21/2021   Non-alcoholic fatty liver disease 01/31/2021   Vertigo 06/10/2020    PCP: Alston Silvio BROCKS, FNP  REFERRING PROVIDER: Gershon Donnice SAUNDERS, DPM  REFERRING DIAG: M21.619 (ICD-10-CM) - Bunion R26.81 (ICD-10-CM) - Gait instability  THERAPY DIAG:  Stiffness of right ankle, not elsewhere classified  Stiffness of right foot, not elsewhere  classified  Pain in right toe(s)  Pain in right ankle and joints of right foot  Localized edema  Difficulty in walking, not elsewhere classified  Rationale for Evaluation and Treatment: Rehabilitation  ONSET DATE: 09/10/24  SUBJECTIVE:   SUBJECTIVE STATEMENT: Pt states she wore her boot one day and it hurt her feet. Felt better once she was out of the boot. Very little pain otherwise.   PERTINENT HISTORY: Cancer, high blood pressure  PAIN:  Are you having pain? No  PRECAUTIONS: Fall No blood pressure on both arms or heavy lifting  RED FLAGS: None   WEIGHT BEARING RESTRICTIONS: WBAT  FALLS:  Has patient fallen in last 6 months? Yes. 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  DIAGNOSTIC FINDINGS:   PATIENT SURVEYS:  LEFS  Extreme difficulty/unable (0), Quite a bit of difficulty (1), Moderate difficulty (2), Little difficulty (3), No difficulty (4) Survey date:  11/04/24  Any of your usual  work, housework or school activities 1  2. Usual hobbies, recreational or sporting activities 1  3. Getting into/out of the bath 1  4. Walking between rooms 1  5. Putting on socks/shoes 1  6. Squatting  1  7. Lifting an object, like a bag of groceries from the floor 1  8. Performing light activities around your home 3  9. Performing heavy activities around your home 0  10. Getting into/out of a car 1  11. Walking 2 blocks 0  12. Walking 1 mile 0  13. Going up/down 10 stairs (1 flight) 0  14. Standing for 1 hour 0   15.  sitting for 1 hour 4  16. Running on even ground 0  17. Running on uneven ground 0  18. Making sharp turns while running fast 0  19. Hopping  0  20. Rolling over in bed 1  Score total:  16/80     EDEMA:  Mild edema R vs L foot around forefoot  PALPATION: No overt tenderness to palpation  LOWER EXTREMITY ROM:  Active ROM Right eval Left eval  Hip flexion    Hip extension    Hip abduction    Hip adduction    Hip internal rotation    Hip external rotation    Knee flexion    Knee extension    Ankle dorsiflexion 5 11  Ankle plantarflexion 42 65  Ankle inversion 40 40  Ankle eversion 35 35   (Blank rows = not tested)  LOWER EXTREMITY MMT:  MMT Right eval Left eval  Hip flexion 5 5  Hip extension 3+ 3+  Hip abduction 3+ 4-  Hip adduction    Hip internal rotation 3+ 3+  Hip external rotation 3+ 3+  Knee flexion 5 5  Knee extension 5 5  Ankle dorsiflexion    Ankle plantarflexion    Ankle inversion    Ankle eversion     (Blank rows = not tested)  LOWER EXTREMITY SPECIAL TESTS:  Did not assess  FUNCTIONAL TESTS:  Tandem stance: R LE back >1 min SLS: pt fearful to attempt  GAIT: Distance walked: Into clinic Assistive device utilized: cam boot on R Level of assistance: Complete Independence Comments: Mildly antalgic, R LE externally rotated, reduced step length on L                                                                                                                                TREATMENT DATE:  12/09/24                                  EXERCISE LOG  Exercise Repetitions and Resistance Comments  Nustep Lvl 3 x 10 mins   Soleus stretch 30 sec x 2   Gastroc Stretch 30 sec x 2 reps    Heel/Toe Raises with toes on slant board 10x5 sec  Little toes up on slant board, big toe on ground SLS 3x10x5 sec First with hand held assist and then without   Standing toe abd 2x10   Standing dynadisc heel to toe off 2x10   Monster walk fwd/bwd red TB  2x10   Side step red TB 2x10        Blank cell = exercise not performed today    12/02/24                                  EXERCISE LOG  Exercise Repetitions and Resistance Comments  Nustep Lvl 5 x 15 mins   Rockerboard (standing) 3 mins each way   Gastroc Stretch 30 sec x 3 reps (slant board)   Lunges 14 box x 2 mins   Heel/Toe Raises    Towel Scrunches 2 sets of 15 reps   Marble Pick Ups 20 marbles x 6 reps   Sitting Toe Abd 2 sets of 15 reps   DynaDisc CW and CCW circles x 2 mins each    Blank cell = exercise not performed today   11/24/24 Nustep L6 x 10 min UEs/LEs Standing gastroc stretch 2x 30 Standing soleus stretch with strap 2x 30 Sitting ankle 3 ways red TB 2x10 each Standing alternating heel raise 2x10 Sitting arch lift 2x10 Sitting toe splaying 2x10 Sitting marble pick up 2x10 Sitting toe abd 2x10 Standing toe off 2x10 Standing tandem stance 2x30  11/18/24 Nustep L3 x10 min UEs/LEs Sitting gastroc stretch with strap x30 Sitting soleus stretch with strap x30 Sitting rockerboard DF/PF 2x30 Sitting rockerboard L<>R 2x30 Sitting heel raise 2x10 Sitting toe splaying 2x10 Sitting toe flexion 2x10 Sitting toe extension 2x10 Sitting ankle inv/ev 2x10 Sitting arch lift 2x10 Sitting big toe abduction 2x10 Self care: self scar massage, self gentle toe mobs PA/APs  11/04/24 See HEP below    PATIENT EDUCATION:  Education details: HEP updates Person educated: Patient Education method: Explanation, Demonstration, and Handouts Education comprehension: verbalized understanding, returned demonstration, and needs further education  HOME EXERCISE PROGRAM: Access Code: [email or web address] URL: [email or web address] Date: 12/09/2024 Prepared by: Kellyn Mccary April Earnie Starring  Exercises - Standing Soleus Stretch  - 1 x daily - 7 x weekly - 2 sets - 30 sec hold - Standing Gastroc Stretch  - 1 x daily - 7 x weekly - 2 sets - 30 sec hold - Toe Spreading  -  2-3 x daily - 7 x weekly - 1 sets - 10 reps - Standing Heel Raise with Support  - 1 x daily - 7 x weekly - 2 sets - 10 reps - Side Stepping with Resistance at Ankles  - 1 x daily - 7 x weekly - 2 sets - 10 reps - Forward and Backward Monster Walk with Resistance at Ankles and Counter Support  - 1 x daily - 7 x weekly - 2 sets - 10 reps - Single Leg Stance with Support  - 1 x daily - 7 x weekly - 2 sets - 10 reps - 5 sec hold  ASSESSMENT:  CLINICAL IMPRESSION: Able to progress pt's exercise to all standing. Still hypomobile and tight with little toe extension -- difficulty performing heel raises because of this. Working on improving weight tolerance on to pt's forefoot/toes. Able to progress balance and stabilizing exercises.   OBJECTIVE IMPAIRMENTS: Abnormal gait, decreased activity tolerance, decreased balance, decreased coordination, decreased endurance, decreased mobility, difficulty walking, decreased ROM, decreased  strength, hypomobility, increased edema, increased fascial restrictions, impaired flexibility, improper body mechanics, and postural dysfunction.   ACTIVITY LIMITATIONS: carrying, lifting, standing, squatting, stairs, transfers, bathing, toileting, dressing, hygiene/grooming, and locomotion level  PARTICIPATION LIMITATIONS: meal prep, cleaning, laundry, driving, shopping, community activity, and yard work  PERSONAL FACTORS: Age, Fitness, Past/current experiences, and Time since onset of injury/illness/exacerbation are also affecting patient's functional outcome.   REHAB POTENTIAL: Good  CLINICAL DECISION MAKING: Evolving/moderate complexity  EVALUATION COMPLEXITY: Moderate   GOALS: Goals reviewed with patient? Yes  SHORT TERM GOALS: Target date: 12/02/2024  Pt will be ind with initial HEP Baseline: Goal status: INITIAL  2.  Pt will demo R = L ankle DF for amb Baseline:  Goal status: INITIAL  3.  Pt will be able to perform bilat heel raise x10 to demo increasing  ankle strength Baseline:  Goal status: INITIAL    LONG TERM GOALS: Target date: 12/30/2024   Pt will be ind with management and progression of HEP Baseline:  Goal status: INITIAL  2.  Pt will be able to perform SLS x 10 sec on R LE to demo increased foot/ankle stability Baseline:  Goal status: INITIAL  3.  Pt will be able to amb with normal reciprocal pattern x1000' for community amb Baseline:  Goal status: INITIAL  4.  Pt will have improved LEFS to >/=32/80 Baseline:  Goal status: INITIAL  5.  Pt will be able to perform single leg heel raise to demo normal return of ankle strength Baseline:  Goal status: INITIAL    PLAN:  PT FREQUENCY: 1-2x/week  PT DURATION: 8 weeks  PLANNED INTERVENTIONS: 97164- PT Re-evaluation, 97750- Physical Performance Testing, 97110-Therapeutic exercises, 97530- Therapeutic activity, [id number]- Neuromuscular re-education, 97535- Self Care, [id number]- Manual therapy, [id number]- Gait training, [phone]- Electrical stimulation (unattended), 97016- Vasopneumatic device, [id number]- Ultrasound, [id number]- Ionotophoresis 4mg /ml Dexamethasone, 79439 (1-2 muscles), 20561 (3+ muscles)- Dry Needling, Patient/Family education, Balance training, Taping, Joint mobilization, Spinal mobilization, Scar mobilization, Compression bandaging, Cryotherapy, and Moist heat  PLAN FOR NEXT SESSION: Check STGs. Assess response to HEP. Work on ankle/toe mobility and strengthening. Intrinsic foot strengthening.    Bettyann Birchler April Ma L Domnic Vantol, PT 12/09/2024, 9:36 AM  "

## 2024-12-11 ENCOUNTER — Other Ambulatory Visit: Payer: Self-pay

## 2024-12-11 NOTE — Progress Notes (Signed)
 Specialty Pharmacy Refill Coordination Note  Sharon Phillips is a 54 y.o. female assessed today regarding refills of clinic administered specialty medication(s) OnabotulinumtoxinA  (Botox )   Clinic requested Courier to Provider Office   Delivery date: 12/22/24   Verified address: [facility] [address] [place] [address] [place], [place]   Medication will be filled on: 12/19/24   Appointment 12/26/24. Copay approved.

## 2024-12-12 ENCOUNTER — Ambulatory Visit

## 2024-12-12 ENCOUNTER — Ambulatory Visit: Admitting: Podiatry

## 2024-12-12 DIAGNOSIS — M2042 Other hammer toe(s) (acquired), left foot: Secondary | ICD-10-CM

## 2024-12-12 DIAGNOSIS — M21619 Bunion of unspecified foot: Secondary | ICD-10-CM

## 2024-12-12 DIAGNOSIS — M2041 Other hammer toe(s) (acquired), right foot: Secondary | ICD-10-CM

## 2024-12-12 NOTE — Progress Notes (Signed)
 Subjective: Chief Complaint  Patient presents with   Hammer Toe    Patient presents today for a follow up on RT [place] BUNIONECTOMY, SHORTENING OF 2ND METATARSAL, 2ND DIGIT HAMMERTOE REPAIR DOS 09/10/24. Patient denies pain or swelling of surgery site     54 year old female presents the office today above concerns.  States that she is doing well she denies any pain.  She gets some discomfort at times but she is not taking any medication for it.  She states that when she wears her shoes she is comfortable but when she goes up on her tiptoes at therapy she gets some discomfort.  No recent injuries or changes.  No other concerns.  Objective: AAO x3, NAD-presents with daughter DP/PT pulses palpable bilaterally, CRT less than 3 seconds Right: Patient is well healed and the scar is formed.  There is some slight edema present but there is no erythema or warmth.  Toes are rectus.  There are some decreased range of motion of the first MTPJ but there is no pain with MPJ range of motion today.  No erythema, warmth or signs of infection. No pain with calf compression, swelling, warmth, erythema  Assessment: Status post right foot surgery  Plan: -All treatment options discussed with the patient including all alternatives, risks, complications.  -X-rays obtained reviewed.  Multiple views obtained.  Hardware intact uncomplicated factors.  Increased consolidation present.  -Discussed transition to shoe full-time.  She states that she still wears the boot walking long distances or in the ER but think at this time she can increase the time in the shoe.  Continue physical therapy, home rehab exercises on a regular basis.  Continue compression, elevation as well as icing to help with residual edema.  Return in about 4 weeks (around 01/09/2025) for post-op, x-ray.  Donnice JONELLE Fees DPM

## 2024-12-16 ENCOUNTER — Ambulatory Visit: Admitting: Physical Therapy

## 2024-12-16 ENCOUNTER — Encounter: Payer: Self-pay | Admitting: Physical Therapy

## 2024-12-16 DIAGNOSIS — R262 Difficulty in walking, not elsewhere classified: Secondary | ICD-10-CM

## 2024-12-16 DIAGNOSIS — M25671 Stiffness of right ankle, not elsewhere classified: Secondary | ICD-10-CM | POA: Diagnosis not present

## 2024-12-16 DIAGNOSIS — M79674 Pain in right toe(s): Secondary | ICD-10-CM

## 2024-12-16 DIAGNOSIS — R600 Localized edema: Secondary | ICD-10-CM

## 2024-12-16 DIAGNOSIS — M25674 Stiffness of right foot, not elsewhere classified: Secondary | ICD-10-CM

## 2024-12-16 DIAGNOSIS — M25571 Pain in right ankle and joints of right foot: Secondary | ICD-10-CM

## 2024-12-16 NOTE — Therapy (Signed)
 " OUTPATIENT PHYSICAL THERAPY TREATMENT   Patient Name: Sharon Phillips MRN: [id number] DOB:03-26-1971, 54 y.o., female Today's Date: 12/16/2024  END OF SESSION:  PT End of Session - 12/16/24 1016     Visit Number 6    Number of Visits 16    Date for Recertification  12/30/24    Authorization Type Medicare and Medicaid    PT Start Time 1016    PT Stop Time 1055    PT Time Calculation (min) 39 min    Activity Tolerance Patient tolerated treatment well           Past Medical History:  Diagnosis Date   Anxiety    Cancer (HCC)    Depression    High cholesterol    Hypertension    Past Surgical History:  Procedure Laterality Date   ABDOMINAL HYSTERECTOMY     CENTRAL VENOUS CATHETER INSERTION Right 06/27/2021   Procedure: INSERTION CENTRAL LINE ADULT, tunneled;  Surgeon: Kallie Manuelita BROCKS, MD;  Location: [facility];  Service: General;  Laterality: Right;   MASTECTOMY     bilateral   Patient Active Problem List   Diagnosis Date Noted   Atypical chest pain 01/08/2024   Chronic migraine without aura, not intractable, without status migrainosus 02/15/2022   Anxiety 02/15/2022   Mixed anxiety depressive disorder 02/15/2022   Monoallelic mutation of PALB2 gene 02/10/2022   Genetic testing 02/10/2022   Bilateral breast cancer (HCC) 08/01/2021   History of bilateral mastectomy    Cellulitis of left upper extremity 06/22/2021   Bacteremia 06/22/2021   Fever, unspecified 06/22/2021   Infection due to Port-A-Cath 06/22/2021   Hypertension    History of breast cancer    High cholesterol    Immunocompromised    Cellulitis 06/21/2021   Non-alcoholic fatty liver disease 01/31/2021   Vertigo 06/10/2020    PCP: Alston Silvio BROCKS, FNP  REFERRING PROVIDER: Gershon Donnice SAUNDERS, DPM  REFERRING DIAG: M21.619 (ICD-10-CM) - Bunion R26.81 (ICD-10-CM) - Gait instability  THERAPY DIAG:  Stiffness of right ankle, not elsewhere classified  Stiffness of right foot, not elsewhere  classified  Pain in right toe(s)  Pain in right ankle and joints of right foot  Localized edema  Difficulty in walking, not elsewhere classified  Rationale for Evaluation and Treatment: Rehabilitation  ONSET DATE: 09/10/24  SUBJECTIVE:   SUBJECTIVE STATEMENT: Pt states she continues to improve. Hasn't been wearing the boot and feels her walking is getting more normal -- she doesn't have to think about it as much.   PERTINENT HISTORY: Cancer, high blood pressure  PAIN:  Are you having pain? No  PRECAUTIONS: Fall No blood pressure on both arms or heavy lifting  RED FLAGS: None   WEIGHT BEARING RESTRICTIONS: WBAT  FALLS:  Has patient fallen in last 6 months? Yes. 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  DIAGNOSTIC FINDINGS:   PATIENT SURVEYS:  LEFS  Extreme difficulty/unable (0), Quite a bit of difficulty (1), Moderate difficulty (2), Little difficulty (3), No difficulty (4) Survey date:  11/04/24  Any of your  usual work, housework or school activities 1  2. Usual hobbies, recreational or sporting activities 1  3. Getting into/out of the bath 1  4. Walking between rooms 1  5. Putting on socks/shoes 1  6. Squatting  1  7. Lifting an object, like a bag of groceries from the floor 1  8. Performing light activities around your home 3  9. Performing heavy activities around your home 0  10. Getting into/out of a car 1  11. Walking 2 blocks 0  12. Walking 1 mile 0  13. Going up/down 10 stairs (1 flight)  0  14. Standing for 1 hour 0  15.  sitting for 1 hour 4  16. Running on even ground 0  17. Running on uneven ground 0  18. Making sharp turns while running fast 0  19. Hopping  0  20. Rolling over in bed 1  Score total:  16/80     EDEMA:  Mild edema R vs L foot around forefoot  PALPATION: No overt tenderness to palpation  LOWER EXTREMITY ROM:  Active ROM Right eval Left eval Right  Hip flexion     Hip extension     Hip abduction     Hip adduction     Hip internal rotation     Hip external rotation     Knee flexion     Knee extension     Ankle dorsiflexion 5 11 15   Ankle plantarflexion 42 65 60  Ankle inversion 40 40   Ankle eversion 35 35    (Blank rows = not tested)  LOWER EXTREMITY MMT:  MMT Right eval Left eval  Hip flexion 5 5  Hip extension 3+ 3+  Hip abduction 3+ 4-  Hip adduction    Hip internal rotation 3+ 3+  Hip external rotation 3+ 3+  Knee flexion 5 5  Knee extension 5 5  Ankle dorsiflexion    Ankle plantarflexion    Ankle inversion    Ankle eversion     (Blank rows = not tested)  LOWER EXTREMITY SPECIAL TESTS:  Did not assess  FUNCTIONAL TESTS:  Tandem stance: R LE back >1 min SLS: pt fearful to attempt  GAIT: Distance walked: Into clinic Assistive device utilized: cam boot on R Level of assistance: Complete Independence Comments: Mildly antalgic, R LE externally rotated, reduced step length on L                                                                                                                                TREATMENT DATE:  12/16/24      EXERCISE LOG  Exercise Repetitions and Resistance Comments  Nustep Lvl 5 x 10 mins   Soleus stretch 30 sec x 2   Gastroc Stretch 30 sec x 2 reps    Heel/Toe Raises with toes on slant board 2x10x5 sec   Little toes up on slant board, big toe on ground SLS 10x10  sec   Single leg step up on dynadisc and side tap on 4 step x10   Single leg step up on dynadisc and fwd tap on 4  step To fatigue   Monster walk fwd/bwd red TB 2x10   Side step red TB 2x10        Blank cell = exercise not performed today   12/09/24                                  EXERCISE LOG  Exercise Repetitions and Resistance Comments  Nustep Lvl 3 x 10 mins   Soleus stretch 30 sec x 2   Gastroc Stretch 30 sec x 2 reps    Heel/Toe Raises with toes on slant board 10x5 sec   Little toes up on slant board, big toe on ground SLS 3x10x5 sec First with hand held assist and then without   Standing toe abd 2x10   Standing dynadisc heel to toe off 2x10   Monster walk fwd/bwd red TB 2x10   Side step red TB 2x10        Blank cell = exercise not performed today    12/02/24                                  EXERCISE LOG  Exercise Repetitions and Resistance Comments  Nustep Lvl 5 x 15 mins   Rockerboard (standing) 3 mins each way   Gastroc Stretch 30 sec x 3 reps (slant board)   Lunges 14 box x 2 mins   Heel/Toe Raises    Towel Scrunches 2 sets of 15 reps   Marble Pick Ups 20 marbles x 6 reps   Sitting Toe Abd 2 sets of 15 reps   DynaDisc CW and CCW circles x 2 mins each    Blank cell = exercise not performed today   11/24/24 Nustep L6 x 10 min UEs/LEs Standing gastroc stretch 2x 30 Standing soleus stretch with strap 2x 30 Sitting ankle 3 ways red TB 2x10 each Standing alternating heel raise 2x10 Sitting arch lift 2x10 Sitting toe splaying 2x10 Sitting marble pick up 2x10 Sitting toe abd 2x10 Standing toe off 2x10 Standing tandem stance 2x30    PATIENT EDUCATION:  Education details: HEP updates Person educated: Patient Education method: Explanation, Demonstration, and Handouts Education comprehension: verbalized understanding, returned demonstration, and needs further education  HOME EXERCISE PROGRAM: Access Code: [email or web address] URL: [email or web address] Date: 12/09/2024 Prepared by: Daffney Greenly April Earnie Starring  Exercises - Standing Soleus Stretch  - 1 x daily - 7 x  weekly - 2 sets - 30 sec hold - Standing Gastroc Stretch  - 1 x daily - 7 x weekly - 2 sets - 30 sec hold - Toe Spreading  - 2-3 x daily - 7 x weekly - 1 sets - 10 reps - Standing Heel Raise with Support  - 1 x daily - 7 x weekly - 2 sets - 10 reps - Side Stepping with Resistance at Ankles  - 1 x daily - 7 x weekly - 2 sets - 10 reps - Forward and Backward Monster Walk with Resistance at Ankles and Counter Support  - 1 x daily - 7 x weekly - 2 sets - 10 reps - Single Leg Stance with Support  - 1 x daily - 7 x weekly - 2 sets - 10  reps - 5 sec hold  ASSESSMENT:  CLINICAL IMPRESSION: Continued to work on improving 2nd right phalange ext with increased weight -- pt still has a hard time tolerating this. Progressed single leg stabilization exercises with pt highly challenged and fatigue with standing on dynadisc.   OBJECTIVE IMPAIRMENTS: Abnormal gait, decreased activity tolerance, decreased balance, decreased coordination, decreased endurance, decreased mobility, difficulty walking, decreased ROM, decreased strength, hypomobility, increased edema, increased fascial restrictions, impaired flexibility, improper body mechanics, and postural dysfunction.   ACTIVITY LIMITATIONS: carrying, lifting, standing, squatting, stairs, transfers, bathing, toileting, dressing, hygiene/grooming, and locomotion level  PARTICIPATION LIMITATIONS: meal prep, cleaning, laundry, driving, shopping, community activity, and yard work  PERSONAL FACTORS: Age, Fitness, Past/current experiences, and Time since onset of injury/illness/exacerbation are also affecting patient's functional outcome.   REHAB POTENTIAL: Good  CLINICAL DECISION MAKING: Evolving/moderate complexity  EVALUATION COMPLEXITY: Moderate   GOALS: Goals reviewed with patient? Yes  SHORT TERM GOALS: Target date: 12/02/2024  Pt will be ind with initial HEP Baseline: Goal status: INITIAL  2.  Pt will demo R = L ankle DF for amb Baseline:  Goal  status: MET  3.  Pt will be able to perform bilat heel raise x10 to demo increasing ankle strength Baseline:  12/16/14: able to perform but with increased pain in her forefoot Goal status: MET    LONG TERM GOALS: Target date: 12/30/2024   Pt will be ind with management and progression of HEP Baseline:  Goal status: INITIAL  2.  Pt will be able to perform SLS x 10 sec on R LE to demo increased foot/ankle stability Baseline:  Goal status: INITIAL  3.  Pt will be able to amb with normal reciprocal pattern x1000' for community amb Baseline:  Goal status: INITIAL  4.  Pt will have improved LEFS to >/=32/80 Baseline:  Goal status: INITIAL  5.  Pt will be able to perform single leg heel raise to demo normal return of ankle strength Baseline:  Goal status: INITIAL    PLAN:  PT FREQUENCY: 1-2x/week  PT DURATION: 8 weeks  PLANNED INTERVENTIONS: 97164- PT Re-evaluation, 97750- Physical Performance Testing, 97110-Therapeutic exercises, 97530- Therapeutic activity, [id number]- Neuromuscular re-education, 97535- Self Care, [id number]- Manual therapy, [id number]- Gait training, [phone]- Electrical stimulation (unattended), 97016- Vasopneumatic device, [id number]- Ultrasound, [id number]- Ionotophoresis 4mg /ml Dexamethasone, 79439 (1-2 muscles), 20561 (3+ muscles)- Dry Needling, Patient/Family education, Balance training, Taping, Joint mobilization, Spinal mobilization, Scar mobilization, Compression bandaging, Cryotherapy, and Moist heat  PLAN FOR NEXT SESSION:  Assess response to HEP. Work on ankle/toe mobility and strengthening. Stability and dynamic movement on foam/compliant surfaces.    Lamoine Magallon April Ma L Cricket Goodlin, PT 12/16/2024, 10:23 AM  "

## 2024-12-19 ENCOUNTER — Other Ambulatory Visit: Payer: Self-pay

## 2024-12-21 ENCOUNTER — Other Ambulatory Visit (HOSPITAL_COMMUNITY): Payer: Self-pay

## 2024-12-23 ENCOUNTER — Ambulatory Visit

## 2024-12-26 ENCOUNTER — Ambulatory Visit (INDEPENDENT_AMBULATORY_CARE_PROVIDER_SITE_OTHER): Admitting: Neurology

## 2024-12-26 DIAGNOSIS — G43709 Chronic migraine without aura, not intractable, without status migrainosus: Secondary | ICD-10-CM

## 2024-12-26 MED ORDER — ONABOTULINUMTOXINA 100 UNITS IJ SOLR
200.0000 [IU] | Freq: Once | INTRAMUSCULAR | Status: AC
  Administered 2024-12-26: 155 [IU] via INTRAMUSCULAR

## 2024-12-26 NOTE — Progress Notes (Signed)

## 2025-01-09 ENCOUNTER — Ambulatory Visit: Admitting: Podiatry

## 2025-03-27 ENCOUNTER — Ambulatory Visit: Payer: Self-pay | Admitting: Neurology

## 2025-05-28 ENCOUNTER — Ambulatory Visit: Admitting: Neurology

## 2025-08-18 ENCOUNTER — Other Ambulatory Visit (HOSPITAL_COMMUNITY)

## 2025-10-06 ENCOUNTER — Inpatient Hospital Stay

## 2025-10-13 ENCOUNTER — Inpatient Hospital Stay: Admitting: Physician Assistant
